# Patient Record
Sex: Male | Born: 1945
Health system: Southern US, Community
[De-identification: ages and names within clinical notes are randomized; demographics above are authoritative.]

## PROBLEM LIST (undated history)

## (undated) ENCOUNTER — Emergency Department (HOSPITAL_BASED_OUTPATIENT_CLINIC_OR_DEPARTMENT_OTHER): Admission: EM | Payer: Self-pay | Source: Home / Self Care

## (undated) DIAGNOSIS — E7800 Pure hypercholesterolemia, unspecified: Secondary | ICD-10-CM

## (undated) DIAGNOSIS — I6529 Occlusion and stenosis of unspecified carotid artery: Secondary | ICD-10-CM

## (undated) DIAGNOSIS — K5790 Diverticulosis of intestine, part unspecified, without perforation or abscess without bleeding: Secondary | ICD-10-CM

## (undated) DIAGNOSIS — C4490 Unspecified malignant neoplasm of skin, unspecified: Secondary | ICD-10-CM

## (undated) DIAGNOSIS — K222 Esophageal obstruction: Secondary | ICD-10-CM

## (undated) DIAGNOSIS — I10 Essential (primary) hypertension: Secondary | ICD-10-CM

## (undated) DIAGNOSIS — M19079 Primary osteoarthritis, unspecified ankle and foot: Secondary | ICD-10-CM

## (undated) DIAGNOSIS — E739 Lactose intolerance, unspecified: Secondary | ICD-10-CM

## (undated) DIAGNOSIS — I639 Cerebral infarction, unspecified: Secondary | ICD-10-CM

## (undated) DIAGNOSIS — K635 Polyp of colon: Secondary | ICD-10-CM

## (undated) DIAGNOSIS — D869 Sarcoidosis, unspecified: Secondary | ICD-10-CM

## (undated) DIAGNOSIS — H269 Unspecified cataract: Secondary | ICD-10-CM

## (undated) DIAGNOSIS — K219 Gastro-esophageal reflux disease without esophagitis: Secondary | ICD-10-CM

## (undated) HISTORY — DX: Diverticulosis of intestine, part unspecified, without perforation or abscess without bleeding: K57.90

## (undated) HISTORY — PX: MOHS SURGERY: SUR867

## (undated) HISTORY — DX: Occlusion and stenosis of unspecified carotid artery: I65.29

## (undated) HISTORY — DX: Gastro-esophageal reflux disease without esophagitis: K21.9

## (undated) HISTORY — DX: Lactose intolerance, unspecified: E73.9

## (undated) HISTORY — PX: COLONOSCOPY: SHX174

## (undated) HISTORY — DX: Unspecified malignant neoplasm of skin, unspecified: C44.90

## (undated) HISTORY — DX: Esophageal obstruction: K22.2

## (undated) HISTORY — DX: Polyp of colon: K63.5

## (undated) HISTORY — DX: Sarcoidosis, unspecified: D86.9

## (undated) HISTORY — PX: TONSILLECTOMY: SUR1361

## (undated) HISTORY — DX: Cerebral infarction, unspecified: I63.9

## (undated) HISTORY — DX: Essential (primary) hypertension: I10

## (undated) HISTORY — DX: Unspecified cataract: H26.9

---

## 2007-08-16 ENCOUNTER — Encounter: Admission: RE | Admit: 2007-08-16 | Discharge: 2007-08-16 | Payer: Self-pay | Admitting: Rheumatology

## 2007-09-20 ENCOUNTER — Ambulatory Visit: Payer: Self-pay | Admitting: Internal Medicine

## 2007-09-20 DIAGNOSIS — R930 Abnormal findings on diagnostic imaging of skull and head, not elsewhere classified: Secondary | ICD-10-CM | POA: Insufficient documentation

## 2007-09-20 DIAGNOSIS — D869 Sarcoidosis, unspecified: Secondary | ICD-10-CM | POA: Insufficient documentation

## 2007-09-20 HISTORY — DX: Sarcoidosis, unspecified: D86.9

## 2007-10-04 ENCOUNTER — Ambulatory Visit: Payer: Self-pay | Admitting: Cardiovascular Disease

## 2008-01-18 ENCOUNTER — Encounter: Payer: Self-pay | Admitting: Internal Medicine

## 2008-02-08 ENCOUNTER — Encounter: Admission: RE | Admit: 2008-02-08 | Discharge: 2008-02-08 | Payer: Self-pay | Admitting: Rheumatology

## 2011-07-10 DIAGNOSIS — I639 Cerebral infarction, unspecified: Secondary | ICD-10-CM

## 2011-07-10 HISTORY — DX: Cerebral infarction, unspecified: I63.9

## 2011-07-30 ENCOUNTER — Inpatient Hospital Stay (HOSPITAL_BASED_OUTPATIENT_CLINIC_OR_DEPARTMENT_OTHER)
Admission: EM | Admit: 2011-07-30 | Discharge: 2011-08-04 | DRG: 039 | Disposition: A | Discharge status: Home / Self Care | Payer: Medicare Other | Attending: Internal Medicine | Admitting: Internal Medicine

## 2011-07-30 ENCOUNTER — Emergency Department (HOSPITAL_BASED_OUTPATIENT_CLINIC_OR_DEPARTMENT_OTHER): Payer: Medicare Other

## 2011-07-30 ENCOUNTER — Observation Stay (HOSPITAL_COMMUNITY): Payer: Medicare Other

## 2011-07-30 ENCOUNTER — Encounter (HOSPITAL_BASED_OUTPATIENT_CLINIC_OR_DEPARTMENT_OTHER): Payer: Self-pay | Admitting: *Deleted

## 2011-07-30 DIAGNOSIS — I639 Cerebral infarction, unspecified: Secondary | ICD-10-CM

## 2011-07-30 DIAGNOSIS — E785 Hyperlipidemia, unspecified: Secondary | ICD-10-CM | POA: Diagnosis present

## 2011-07-30 DIAGNOSIS — I6529 Occlusion and stenosis of unspecified carotid artery: Secondary | ICD-10-CM | POA: Diagnosis present

## 2011-07-30 DIAGNOSIS — I498 Other specified cardiac arrhythmias: Secondary | ICD-10-CM | POA: Diagnosis present

## 2011-07-30 DIAGNOSIS — I63239 Cerebral infarction due to unspecified occlusion or stenosis of unspecified carotid arteries: Principal | ICD-10-CM | POA: Diagnosis present

## 2011-07-30 HISTORY — DX: Pure hypercholesterolemia, unspecified: E78.00

## 2011-07-30 HISTORY — DX: Primary osteoarthritis, unspecified ankle and foot: M19.079

## 2011-07-30 LAB — CK TOTAL AND CKMB (NOT AT ARMC): CK, MB: 2.9 ng/mL (ref 0.3–4.0)

## 2011-07-30 LAB — CBC
MCHC: 35.4 g/dL (ref 30.0–36.0)
Platelets: 203 10*3/uL (ref 150–400)
RDW: 13 % (ref 11.5–15.5)

## 2011-07-30 LAB — RAPID URINE DRUG SCREEN, HOSP PERFORMED
Cocaine: NOT DETECTED
Opiates: NOT DETECTED
Tetrahydrocannabinol: NOT DETECTED

## 2011-07-30 LAB — LIPID PANEL
Cholesterol: 219 mg/dL — ABNORMAL HIGH (ref 0–200)
LDL Cholesterol: 141 mg/dL — ABNORMAL HIGH (ref 0–99)
VLDL: 14 mg/dL (ref 0–40)

## 2011-07-30 LAB — COMPREHENSIVE METABOLIC PANEL
AST: 18 U/L (ref 0–37)
Albumin: 4.3 g/dL (ref 3.5–5.2)
Chloride: 104 mEq/L (ref 96–112)
Creatinine, Ser: 1.1 mg/dL (ref 0.50–1.35)
Potassium: 4 mEq/L (ref 3.5–5.1)
Total Bilirubin: 0.5 mg/dL (ref 0.3–1.2)

## 2011-07-30 LAB — DIFFERENTIAL
Basophils Absolute: 0 10*3/uL (ref 0.0–0.1)
Basophils Relative: 0 % (ref 0–1)
Monocytes Absolute: 0.5 10*3/uL (ref 0.1–1.0)
Neutro Abs: 4 10*3/uL (ref 1.7–7.7)
Neutrophils Relative %: 60 % (ref 43–77)

## 2011-07-30 LAB — PROTIME-INR: INR: 1 (ref 0.00–1.49)

## 2011-07-30 LAB — APTT: aPTT: 38 seconds — ABNORMAL HIGH (ref 24–37)

## 2011-07-30 MED ORDER — ASPIRIN 325 MG PO TABS
325.0000 mg | ORAL_TABLET | Freq: Every day | ORAL | Status: DC
  Administered 2011-07-30 – 2011-08-04 (×5): 325 mg via ORAL
  Filled 2011-07-30 (×6): qty 1

## 2011-07-30 MED ORDER — ASPIRIN 325 MG PO TABS: 325.0000 mg | ORAL_TABLET | Freq: Once | ORAL | Status: DC

## 2011-07-30 NOTE — ED Notes (Signed)
Report called to [facility], they are aware of pt coming

## 2011-07-30 NOTE — ED Provider Notes (Signed)
Patient sent to CDU from [facility] by Dr. Fonnie Jarvis. Accepting physician Dr. Jeraldine Loots. TIA protocol  Patient complaining of intermittent transient left facial, arm weakness with some numbness and tingling. Episodes last between 10-15 mintues. He is now asymptomatic with last episode at 6:30 am this morning. 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  He has no known medical problems and takes no med on regular basis. Does not have PCP  PE. Alert and oriented x 3 NAD Head: normocephalic atraumatic Eyes: EOMI, PEERLA ENT: WNL Neck: supple FROM Chest: RRR, no murmer or rub normal S1 S2 Lungs: normal respirations with no adventurous sounds on auscultation. Normal resp effort Abdomen: soft and non tender Extremities FROM of UE and LE with 5/5 strength and normal reflexes.  Neuro: alert and orient with cranial nerves II-XII grossly intact. Normal coordination  VSS  Patient voices his understanding of TIA protocol and will remain in CDU over night for additional imaging in am  At time of dispo, if dispo home then patient needs referral to primary care and consideration of starting medication for blood pressure and cholesterol.   [place], [place] 07/30/11 2048

## 2011-07-30 NOTE — ED Notes (Signed)
Numbness in the left side of his face and left arm x 2 days. Drove himself here. Neuro intact. Steady gait.

## 2011-07-30 NOTE — ED Provider Notes (Signed)
History     CSN: [id number]  Arrival date & time 07/30/11  1428   First MD Initiated Contact with Patient 07/30/11 1514      Chief Complaint  Patient presents with  . Numbness  transient left sided weak/numb  PCP- none  (Consider location/radiation/quality/duration/timing/severity/associated sxs/prior treatment) HPI This 66 year old male over the last 3 days now has had a few spells of transient left facial arm mild weakness numbness tingling each spell lasting less than 10-15 minutes. He is now asymptomatic. His last spell was this morning at 6:30 this morning and has been asymptomatic for the last several hours.  He had no headache, lightheadedness, vertigo, or change in speech vision swallowing or understanding. He also had no palpitations chest pain shortness breath abdominal pain or bloody stools or trauma. He is asymptomatic now and drove himself to the emergency department. There's been no treatment prior to arrival. He had no associated symptoms other than his transient intermittent weakness numbness tingling to his left face and left arm. He had no neck pain or back pain. History reviewed. No pertinent past medical history. Borderline high cholesterol History reviewed. No pertinent past surgical history.  No family history on file.  History  Substance Use Topics  . Smoking status: Never Smoker   . Smokeless tobacco: Not on file  . Alcohol Use: Yes  Tob-none Drugs- none EtOH- occasional    Review of Systems  Constitutional: Negative for fever.       10 Systems reviewed and are negative for acute change except as noted in the HPI.  HENT: Negative for congestion.   Eyes: Negative for discharge and redness.  Respiratory: Negative for cough and shortness of breath.   Cardiovascular: Negative for chest pain.  Gastrointestinal: Negative for vomiting and abdominal pain.  Musculoskeletal: Negative for back pain.  Skin: Negative for rash.  Neurological: Positive for  weakness and numbness. Negative for dizziness, syncope, speech difficulty, light-headedness and headaches.  Psychiatric/Behavioral:       No behavior change.    Allergies  Review of patient's allergies indicates no known allergies.  Home Medications  No current outpatient prescriptions on file.  BP 150/69  Pulse 65  Temp 98.3 F (36.8 C) (Oral)  Resp 18  SpO2 98%  Physical Exam  Nursing note and vitals reviewed. Constitutional:       Awake, alert, nontoxic appearance with baseline speech for patient.  HENT:  Head: Atraumatic.  Mouth/Throat: No oropharyngeal exudate.  Eyes: EOM are normal. Pupils are equal, round, and reactive to light. Right eye exhibits no discharge. Left eye exhibits no discharge.  Neck: Neck supple.  Cardiovascular: Normal rate and regular rhythm.   No murmur heard. Pulmonary/Chest: Effort normal and breath sounds normal. No stridor. No respiratory distress. He has no wheezes. He has no rales. He exhibits no tenderness.  Abdominal: Soft. Bowel sounds are normal. He exhibits no mass. There is no tenderness. There is no rebound.  Musculoskeletal: He exhibits no tenderness.       Baseline ROM, moves extremities with no obvious new focal weakness.  Lymphadenopathy:    He has no cervical adenopathy.  Neurological:       Awake, alert, cooperative and aware of situation; motor strength bilaterally; sensation normal to light touch bilaterally; peripheral visual fields full to confrontation; no facial asymmetry; tongue midline; major cranial nerves appear intact; no pronator drift, normal finger to nose bilaterally, baseline gait without new ataxia.  Skin: No rash noted.  Psychiatric: He has a normal  mood and affect.    ED Course  Procedures (including critical care time) ECG: Sinus rhythm with occasional premature ventricular complexes, ventricular rate 64, normal axis, normal intervals, no acute ischemic changes noted, no comparison ECG available  Pt stable  in ED with no significant deterioration in condition.Patient / Family / Caregiver informed of clinical course, understand medical decision-making process, and agree with plan for ED Obs.  Pt asymptomatic in ED and agrees to ED Obs at [facility] CDU for TIA protocol. 1830 Labs Reviewed  APTT - Abnormal; Notable for the following:    aPTT 38 (*)     All other components within normal limits  COMPREHENSIVE METABOLIC PANEL - Abnormal; Notable for the following:    GFR calc non Af Amer 68 (*)     GFR calc Af Amer 78 (*)     All other components within normal limits  PROTIME-INR  CBC  DIFFERENTIAL  CK TOTAL AND CKMB  TROPONIN I  URINE RAPID DRUG SCREEN (HOSP PERFORMED)  GLUCOSE, CAPILLARY  LIPID PANEL  HEMOGLOBIN A1C   Ct Head Wo Contrast  07/30/2011  *RADIOLOGY REPORT*  Clinical Data: 66 year old male with left arm weakness.  CT HEAD WITHOUT CONTRAST  Technique:  Contiguous axial images were obtained from the base of the skull through the vertex without contrast.  Comparison: None  Findings: No acute intracranial abnormalities are identified, including mass lesion or mass effect, hydrocephalus, extra-axial fluid collection, midline shift, hemorrhage, or acute infarction.  The visualized bony calvarium is unremarkable.  IMPRESSION: No evidence of acute intracranial abnormality.  Original Report Authenticated By: Rosendo Gros, M.D.     1. TIA (transient ischemic attack)       MDM  Care endorsed to [facility] ED CDU d/w [facility] and Adalberto Cole, MD for transfer to complete ED TIA Obs protocol.[id number]        Hurman Horn, MD 08/04/11 [phone]

## 2011-07-30 NOTE — ED Notes (Signed)
Numbness on the left side of his face and left arm x 2 days. No facial droop. No change in thought process. Gait normal. Pt drove himself here.

## 2011-07-30 NOTE — ED Notes (Signed)
Patient transported to MRI 

## 2011-07-31 ENCOUNTER — Inpatient Hospital Stay (HOSPITAL_COMMUNITY): Payer: Medicare Other

## 2011-07-31 ENCOUNTER — Encounter (HOSPITAL_COMMUNITY): Payer: Self-pay | Admitting: Internal Medicine

## 2011-07-31 DIAGNOSIS — I63239 Cerebral infarction due to unspecified occlusion or stenosis of unspecified carotid arteries: Secondary | ICD-10-CM

## 2011-07-31 DIAGNOSIS — G8190 Hemiplegia, unspecified affecting unspecified side: Secondary | ICD-10-CM

## 2011-07-31 DIAGNOSIS — I6529 Occlusion and stenosis of unspecified carotid artery: Secondary | ICD-10-CM | POA: Diagnosis present

## 2011-07-31 DIAGNOSIS — I639 Cerebral infarction, unspecified: Secondary | ICD-10-CM | POA: Diagnosis present

## 2011-07-31 LAB — HEMOGLOBIN A1C: Mean Plasma Glucose: 114 mg/dL (ref <117)

## 2011-07-31 LAB — MAGNESIUM: Magnesium: 2.1 mg/dL (ref 1.5–2.5)

## 2011-07-31 MED ORDER — ASPIRIN 325 MG PO TABS
325.0000 mg | ORAL_TABLET | Freq: Once | ORAL | Status: AC
  Administered 2011-07-31: 325 mg via ORAL
  Filled 2011-07-31: qty 1

## 2011-07-31 MED ORDER — ONDANSETRON HCL 4 MG/2ML IJ SOLN: 4.0000 mg | Freq: Three times a day (TID) | INTRAMUSCULAR | Status: AC | PRN

## 2011-07-31 MED ORDER — SODIUM CHLORIDE 0.9 % IV BOLUS (SEPSIS)
250.0000 mL | Freq: Once | INTRAVENOUS | Status: AC
  Administered 2011-07-31: 18:00:00 via INTRAVENOUS

## 2011-07-31 MED ORDER — ENOXAPARIN SODIUM 40 MG/0.4ML ~~LOC~~ SOLN
40.0000 mg | SUBCUTANEOUS | Status: DC
  Administered 2011-07-31 – 2011-08-02 (×3): 40 mg via SUBCUTANEOUS
  Filled 2011-07-31 (×4): qty 0.4

## 2011-07-31 MED ORDER — ATORVASTATIN CALCIUM 40 MG PO TABS
40.0000 mg | ORAL_TABLET | Freq: Every day | ORAL | Status: DC
  Administered 2011-07-31 – 2011-08-02 (×3): 40 mg via ORAL
  Filled 2011-07-31 (×6): qty 1

## 2011-07-31 NOTE — ED Notes (Signed)
Doppler at bedside.

## 2011-07-31 NOTE — Progress Notes (Signed)
C/o numbness left face. Senses wet in all facial locations except left of left eye at temple. Dr Thad Ranger informed

## 2011-07-31 NOTE — ED Notes (Signed)
Paged [facility] to receive ETA for doppler/echo.

## 2011-07-31 NOTE — ED Provider Notes (Signed)
History     CSN: [id number]  Arrival date & time 07/30/11  1428   First MD Initiated Contact with Patient 07/30/11 1514      Chief Complaint  Patient presents with  . Numbness    (Consider location/radiation/quality/duration/timing/severity/associated sxs/prior treatment) HPI  History reviewed. No pertinent past medical history.  History reviewed. No pertinent past surgical history.  No family history on file.  History  Substance Use Topics  . Smoking status: Never Smoker   . Smokeless tobacco: Not on file  . Alcohol Use: Yes      Review of Systems  Allergies  Review of patient's allergies indicates no known allergies.  Home Medications  No current outpatient prescriptions on file.  BP 152/61  Pulse 68  Temp 97.9 F (36.6 C) (Oral)  Resp 20  SpO2 99%  Physical Exam  ED Course  Procedures (including critical care time)  Labs Reviewed  APTT - Abnormal; Notable for the following:    aPTT 38 (*)     All other components within normal limits  COMPREHENSIVE METABOLIC PANEL - Abnormal; Notable for the following:    GFR calc non Af Amer 68 (*)     GFR calc Af Amer 78 (*)     All other components within normal limits  LIPID PANEL - Abnormal; Notable for the following:    Cholesterol 219 (*)     LDL Cholesterol 141 (*)     All other components within normal limits  PROTIME-INR  CBC  DIFFERENTIAL  CK TOTAL AND CKMB  TROPONIN I  URINE RAPID DRUG SCREEN (HOSP PERFORMED)  GLUCOSE, CAPILLARY  HEMOGLOBIN A1C   Ct Head Wo Contrast  07/30/2011  *RADIOLOGY REPORT*  Clinical Data: 66 year old male with left arm weakness.  CT HEAD WITHOUT CONTRAST  Technique:  Contiguous axial images were obtained from the base of the skull through the vertex without contrast.  Comparison: None  Findings: No acute intracranial abnormalities are identified, including mass lesion or mass effect, hydrocephalus, extra-axial fluid collection, midline shift, hemorrhage, or acute  infarction.  The visualized bony calvarium is unremarkable.  IMPRESSION: No evidence of acute intracranial abnormality.  Original Report Authenticated By: Rosendo Gros, M.D.   Mr Brain Wo Contrast  07/30/2011    *RADIOLOGY REPORT*  Clinical Data:  Left arm numbness.  Rule out CVA.  MRI HEAD WITHOUT CONTRAST MRA HEAD WITHOUT CONTRAST  Technique:  Multiplanar, multiecho pulse sequences of the brain and surrounding structures were obtained without intravenous contrast. Angiographic images of the head were obtained using MRA technique without contrast.  Comparison:  CT 07/30/2011  MRI HEAD  Findings:  4 mm focus of restricted diffusion in the right parietal cortex compatible with acute infarct.  No other acute infarct.  Scattered tiny white matter hyperintensities bilaterally compatible with chronic microvascular ischemia.  No cortical infarct. Brainstem and cerebellum are normal.  Negative for hemorrhage or mass lesion.  Chronic sinusitis.  Mucous retention cyst left maxillary sinus.  IMPRESSION: 4 mm acute infarct right parietal cortex.  Mild chronic microvascular ischemia.  MRA HEAD  Findings: Both vertebral arteries are patent to the basilar.  PICA is patent bilaterally.  Superior cerebellar and posterior cerebral arteries are patent.  Fetal origin of the right posterior cerebral artery.  Internal carotid artery is widely patent bilaterally without stenosis.  Anterior and middle cerebral arteries are patent bilaterally.  Hypoplastic right A1 segment.  Negative for cerebral aneurysm.  IMPRESSION: Negative  Original Report Authenticated By: [name], M.D.  Mr Maxine Glenn Head/brain Wo Cm  07/30/2011    *RADIOLOGY REPORT*  Clinical Data:  Left arm numbness.  Rule out CVA.  MRI HEAD WITHOUT CONTRAST MRA HEAD WITHOUT CONTRAST  Technique:  Multiplanar, multiecho pulse sequences of the brain and surrounding structures were obtained without intravenous contrast. Angiographic images of the head were obtained using MRA  technique without contrast.  Comparison:  CT 07/30/2011  MRI HEAD  Findings:  4 mm focus of restricted diffusion in the right parietal cortex compatible with acute infarct.  No other acute infarct.  Scattered tiny white matter hyperintensities bilaterally compatible with chronic microvascular ischemia.  No cortical infarct. Brainstem and cerebellum are normal.  Negative for hemorrhage or mass lesion.  Chronic sinusitis.  Mucous retention cyst left maxillary sinus.  IMPRESSION: 4 mm acute infarct right parietal cortex.  Mild chronic microvascular ischemia.  MRA HEAD  Findings: Both vertebral arteries are patent to the basilar.  PICA is patent bilaterally.  Superior cerebellar and posterior cerebral arteries are patent.  Fetal origin of the right posterior cerebral artery.  Internal carotid artery is widely patent bilaterally without stenosis.  Anterior and middle cerebral arteries are patent bilaterally.  Hypoplastic right A1 segment.  Negative for cerebral aneurysm.  IMPRESSION: Negative  Original Report Authenticated By: [name], M.D.     1. TIA (transient ischemic attack)    11:30 AM  Patient seen and examined. MRI shows 4mm R parietal lobe infarct. R carotid 80% stenosis. Discussed results with Dr. Radford Pax. Will call neuro.   Exam:  Gen NAD; Neck carotid bruit on right; Heart RRR, nml S1,S2, no m/r/g; Lungs CTAB; Abd soft, NT, no rebound or guarding; Ext 2+ pedal pulses bilaterally, no edema; Neuro CN II-XII grossly intact.   11:38 AM I have spoken with Dr. Thad Ranger who will consult. Will admit to medicine service for eval, possible carotid endarterectomy.   12:16 PM OPC to admit patient, attending Dr. Meredith Pel.    MDM  Acute stroke, admit.         Renne Crigler, [place] 07/31/11 1219  Renne Crigler, [place] 07/31/11 1939

## 2011-07-31 NOTE — ED Provider Notes (Signed)
I was available throughout the CDU portion of this patient's care.  Gerhard Munch, MD 07/31/11 Jacinta Shoe

## 2011-07-31 NOTE — ED Notes (Signed)
Family at bedside. 

## 2011-07-31 NOTE — ED Notes (Signed)
Patient is resting comfortably. 

## 2011-07-31 NOTE — ED Notes (Signed)
Initial assessment by this RN performed at 0715.  Pt c/a/ox4, nad, no complaints of pain.  Pt ambulatory to RR.  Pt advised of tx plan stated he understood.

## 2011-07-31 NOTE — Progress Notes (Addendum)
Bilateral carotid duplex completed.  Preliminary report is >80% ICA stenosis on the right and 40-59% ICA stenosis on the left.

## 2011-07-31 NOTE — Consult Note (Signed)
Referring Physician: Yetta Barre    Chief Complaint: Left sided weakness and numbness  HPI: Eduardo Shaw is an 66 y.o. male who reports that on Tuesday of this week he had an episode of numbness and tingling along the left side of the face and the left arm.  Symptoms resolved.  Symptoms returned on Wednesday and Thursday and became associated with the inability to perform fine motor movements, such as buttoning his shirt with his left hand as well.  Even the repeated symptoms lasted no  More than 20 minutes at a time.  They all resolved spontaneously.  Patient presented for evaluation at that time.  Was sent to [facility] for TIA work up.  In work up MRI of the brain was performed and shows a 4mm acute right parietal cortex infarct.  MRA of the brain was unremarkable.  Preliminary carotid doppler report shows greater than 80% stenosis of the RICA.  LICA is patent.    LSN: 07/27/2011 tPA Given: No: Resolution of symptoms  Past medical history: non-contributory  Past surgical history: None  No family history on file.  Social History:  reports that he has never smoked. He does not have any smokeless tobacco history on file. He reports that he drinks alcohol. He reports that he does not use illicit drugs.  Allergies: No Known Allergies  Medications: I have reviewed the patient's current medications. Prior to Admission:  None  ROS: History obtained from the patient  General ROS: negative for - chills, fatigue, fever, night sweats, weight gain or weight loss Psychological ROS: negative for - behavioral disorder, hallucinations, memory difficulties, mood swings or suicidal ideation Ophthalmic ROS: negative for - blurry vision, double vision, eye pain or loss of vision ENT ROS: negative for - epistaxis, nasal discharge, oral lesions, sore throat, tinnitus or vertigo Allergy and Immunology ROS: negative for - hives or itchy/watery eyes Hematological and Lymphatic ROS: negative for - bleeding problems,  bruising or swollen lymph nodes Endocrine ROS: negative for - galactorrhea, hair pattern changes, polydipsia/polyuria or temperature intolerance Respiratory ROS: negative for - cough, hemoptysis, shortness of breath or wheezing Cardiovascular ROS: negative for - chest pain, dyspnea on exertion, edema or irregular heartbeat Gastrointestinal ROS: negative for - abdominal pain, diarrhea, hematemesis, nausea/vomiting or stool incontinence Genito-Urinary ROS: negative for - dysuria, hematuria, incontinence or urinary frequency/urgency Musculoskeletal ROS: negative for - joint swelling or muscular weakness Neurological ROS: as noted in HPI Dermatological ROS: negative for rash and skin lesion changes  Physical Examination: Blood pressure 152/61, pulse 68, temperature 97.9 F (36.6 C), temperature source Oral, resp. rate 20, SpO2 99.00%.  Neurologic Examination: Mental Status: Alert, oriented, thought content appropriate.  Speech fluent without evidence of aphasia.  Able to follow 3 step commands without difficulty. Cranial Nerves: II: visual fields grossly normal, pupils equal, round, reactive to light and accommodation III,IV, VI: ptosis not present, extra-ocular motions intact bilaterally V,VII: smile symmetric, facial light touch sensation normal bilaterally VIII: hearing normal bilaterally IX,X: gag reflex present XI: trapezius strength/neck flexion strength normal bilaterally XII: tongue strength normal  Motor: Right : Upper extremity   5/5    Left:     Upper extremity   5/5  Lower extremity   5/5     Lower extremity   5/5 Tone and bulk:normal tone throughout; no atrophy noted Sensory: Pinprick and light touch intact throughout, bilaterally Deep Tendon Reflexes: 2+ and symmetric with trace AJ's bilaterally  Plantars: Right: downgoing   Left: downgoing Cerebellar: normal finger-to-nose and normal  heel-to-shin test  Results for orders placed during the hospital encounter of 07/30/11  (from the past 48 hour(s))  PROTIME-INR     Status: Normal   Collection Time   07/30/11  3:31 PM      Component Value Range Comment   Prothrombin Time 13.4  11.6 - 15.2 seconds    INR 1.00  0.00 - 1.49   APTT     Status: Abnormal   Collection Time   07/30/11  3:31 PM      Component Value Range Comment   aPTT 38 (*) 24 - 37 seconds   CBC     Status: Normal   Collection Time   07/30/11  3:31 PM      Component Value Range Comment   WBC 6.8  4.0 - 10.5 K/uL    RBC 5.05  4.22 - 5.81 MIL/uL    Hemoglobin 15.7  13.0 - 17.0 g/dL    HCT 40.9  81.1 - 91.4 %    MCV 87.9  78.0 - 100.0 fL    MCH 31.1  26.0 - 34.0 pg    MCHC 35.4  30.0 - 36.0 g/dL    RDW 78.2  95.6 - 21.3 %    Platelets 203  150 - 400 K/uL   DIFFERENTIAL     Status: Normal   Collection Time   07/30/11  3:31 PM      Component Value Range Comment   Neutrophils Relative 60  43 - 77 %    Neutro Abs 4.0  1.7 - 7.7 K/uL    Lymphocytes Relative 30  12 - 46 %    Lymphs Abs 2.1  0.7 - 4.0 K/uL    Monocytes Relative 8  3 - 12 %    Monocytes Absolute 0.5  0.1 - 1.0 K/uL    Eosinophils Relative 2  0 - 5 %    Eosinophils Absolute 0.1  0.0 - 0.7 K/uL    Basophils Relative 0  0 - 1 %    Basophils Absolute 0.0  0.0 - 0.1 K/uL   COMPREHENSIVE METABOLIC PANEL     Status: Abnormal   Collection Time   07/30/11  3:31 PM      Component Value Range Comment   Sodium 140  135 - 145 mEq/L    Potassium 4.0  3.5 - 5.1 mEq/L    Chloride 104  96 - 112 mEq/L    CO2 25  19 - 32 mEq/L    Glucose, Bld 87  70 - 99 mg/dL    BUN 22  6 - 23 mg/dL    Creatinine, Ser 0.86  0.50 - 1.35 mg/dL    Calcium 9.6  8.4 - 57.8 mg/dL    Total Protein 7.5  6.0 - 8.3 g/dL    Albumin 4.3  3.5 - 5.2 g/dL    AST 18  0 - 37 U/L    ALT 11  0 - 53 U/L    Alkaline Phosphatase 57  39 - 117 U/L    Total Bilirubin 0.5  0.3 - 1.2 mg/dL    GFR calc non Af Amer 68 (*) >90 mL/min    GFR calc Af Amer 78 (*) >90 mL/min   CK TOTAL AND CKMB     Status: Normal   Collection Time     07/30/11  3:31 PM      Component Value Range Comment   Total CK 129  7 - 232 U/L    CK,  MB 2.9  0.3 - 4.0 ng/mL    Relative Index 2.2  0.0 - 2.5   TROPONIN I     Status: Normal   Collection Time   07/30/11  3:31 PM      Component Value Range Comment   Troponin I <0.30  <0.30 ng/mL   LIPID PANEL     Status: Abnormal   Collection Time   07/30/11  3:31 PM      Component Value Range Comment   Cholesterol 219 (*) 0 - 200 mg/dL    Triglycerides 71  <161 mg/dL    HDL 64  >09 mg/dL    Total CHOL/HDL Ratio 3.4      VLDL 14  0 - 40 mg/dL    LDL Cholesterol 604 (*) 0 - 99 mg/dL   HEMOGLOBIN V4U     Status: Normal   Collection Time   07/30/11  3:31 PM      Component Value Range Comment   Hemoglobin A1C 5.6  <5.7 %    Mean Plasma Glucose 114  <117 mg/dL   GLUCOSE, CAPILLARY     Status: Normal   Collection Time   07/30/11  4:04 PM      Component Value Range Comment   Glucose-Capillary 73  70 - 99 mg/dL   URINE RAPID DRUG SCREEN (HOSP PERFORMED)     Status: Normal   Collection Time   07/30/11  5:51 PM      Component Value Range Comment   Opiates NONE DETECTED  NONE DETECTED    Cocaine NONE DETECTED  NONE DETECTED    Benzodiazepines NONE DETECTED  NONE DETECTED    Amphetamines NONE DETECTED  NONE DETECTED    Tetrahydrocannabinol NONE DETECTED  NONE DETECTED    Barbiturates NONE DETECTED  NONE DETECTED    Ct Head Wo Contrast  07/30/2011  *RADIOLOGY REPORT*  Clinical Data: 66 year old male with left arm weakness.  CT HEAD WITHOUT CONTRAST  Technique:  Contiguous axial images were obtained from the base of the skull through the vertex without contrast.  Comparison: None  Findings: No acute intracranial abnormalities are identified, including mass lesion or mass effect, hydrocephalus, extra-axial fluid collection, midline shift, hemorrhage, or acute infarction.  The visualized bony calvarium is unremarkable.  IMPRESSION: No evidence of acute intracranial abnormality.  Original Report  Authenticated By: Rosendo Gros, M.D.   Mr Brain Wo Contrast  07/30/2011    *RADIOLOGY REPORT*  Clinical Data:  Left arm numbness.  Rule out CVA.  MRI HEAD WITHOUT CONTRAST MRA HEAD WITHOUT CONTRAST  Technique:  Multiplanar, multiecho pulse sequences of the brain and surrounding structures were obtained without intravenous contrast. Angiographic images of the head were obtained using MRA technique without contrast.  Comparison:  CT 07/30/2011  MRI HEAD  Findings:  4 mm focus of restricted diffusion in the right parietal cortex compatible with acute infarct.  No other acute infarct.  Scattered tiny white matter hyperintensities bilaterally compatible with chronic microvascular ischemia.  No cortical infarct. Brainstem and cerebellum are normal.  Negative for hemorrhage or mass lesion.  Chronic sinusitis.  Mucous retention cyst left maxillary sinus.  IMPRESSION: 4 mm acute infarct right parietal cortex.  Mild chronic microvascular ischemia.  MRA HEAD  Findings: Both vertebral arteries are patent to the basilar.  PICA is patent bilaterally.  Superior cerebellar and posterior cerebral arteries are patent.  Fetal origin of the right posterior cerebral artery.  Internal carotid artery is widely patent bilaterally without stenosis.  Anterior  and middle cerebral arteries are patent bilaterally.  Hypoplastic right A1 segment.  Negative for cerebral aneurysm.  IMPRESSION: Negative  Original Report Authenticated By: [name], M.D.   Mr Mra Head/brain Wo Cm  07/30/2011    *RADIOLOGY REPORT*  Clinical Data:  Left arm numbness.  Rule out CVA.  MRI HEAD WITHOUT CONTRAST MRA HEAD WITHOUT CONTRAST  Technique:  Multiplanar, multiecho pulse sequences of the brain and surrounding structures were obtained without intravenous contrast. Angiographic images of the head were obtained using MRA technique without contrast.  Comparison:  CT 07/30/2011  MRI HEAD  Findings:  4 mm focus of restricted diffusion in the right parietal  cortex compatible with acute infarct.  No other acute infarct.  Scattered tiny white matter hyperintensities bilaterally compatible with chronic microvascular ischemia.  No cortical infarct. Brainstem and cerebellum are normal.  Negative for hemorrhage or mass lesion.  Chronic sinusitis.  Mucous retention cyst left maxillary sinus.  IMPRESSION: 4 mm acute infarct right parietal cortex.  Mild chronic microvascular ischemia.  MRA HEAD  Findings: Both vertebral arteries are patent to the basilar.  PICA is patent bilaterally.  Superior cerebellar and posterior cerebral arteries are patent.  Fetal origin of the right posterior cerebral artery.  Internal carotid artery is widely patent bilaterally without stenosis.  Anterior and middle cerebral arteries are patent bilaterally.  Hypoplastic right A1 segment.  Negative for cerebral aneurysm.  IMPRESSION: Negative  Original Report Authenticated By: [name], M.D.    Assessment: 66 y.o. male without known stroke risk factors who presents with a small right parietal infarct.  Exam now nonfocal.  Work has revealed a greater than 80% ICA stenosis.  Based on acute infarct this stenosis would be considered symptomatic.  If this stenosis is substantiated patient would benefit from evaluation for a right CEA.  Stroke Risk Factors - carotid stenosis  Plan: 1. HgbA1c, fasting lipid panel 2. Echocardiogram pending 3. CT angio of the neck 6. Prophylactic therapy-Antiplatelet med: Aspirin - dose 325mg  daily 7. Risk factor modification 8. Telemetry monitoring 9. Frequent neuro checks 10. Vascular surgery consult for possible CEA    Thana Farr, MD [facility] [phone] 07/31/2011, 12:49 PM

## 2011-07-31 NOTE — Progress Notes (Signed)
Received to room 3019

## 2011-07-31 NOTE — H&P (Signed)
Hospital Admission Note Date: 07/31/2011  Patient name: Eduardo Shaw Medical record number: [id number] Date of birth: 11-06-1945 Age: 66 y.o. Gender: male PCP: No PCP  Medical Service: Internal Medicine Teaching Service   Attending physician: Dr Meredith Pel   1st Contact: Dr Manson Passey    Pager: [phone] 2nd Contact: Dr Loistine Chance     Pager: [phone]  After 5 pm or weekends: 1st Contact:      Pager: [phone] 2nd Contact:      Pager: [phone]  Chief Complaint:   History of Present Illness: This is a 66 year old male with PMH significant for Hyperlipidemia  who presented to the ED [place] with tingling on his left face and left arm.  Three days prior to admission, the patient experienced left-sided face, arm, and hand tingling, constant and unchanged since that time.  He notes no other tingling in his body, no weakness, and no pain.  No blurry vision, hearing changes, facial droop, slurred speech.  He notes no history of prior stroke.  MRI was performed, which showed an acute right parietal cortex infarct.  Carotid dopplers revealed a >80% R carotid artery stenosis.  The patient notes no chest pain, SOB, nausea/vomiting, abd pain, diarrhea/constipation, melena, or dysuria.  The patient also notes a several month history of calf cramping, described as a "cramping" sensation in the back of both legs, typically occurring when lying down at night, with no alleviating factors.  He notes no leg cramping when walking up stairs or performing other physical activity.  No history of CAD or PVD.  Meds: none  Allergies: Allergies as of 07/30/2011  . (No Known Allergies)   Past Medical History  Diagnosis Date  . Hypercholesterolemia   . Arthritis of foot   One episode of syncope 25 years ago, no etiology identified  Past surgical history:  Moh's surgery to face  Family History  Problem Relation Age of Onset  . Lupus Mother   . Lupus Sister   No family history of CVA, MI, HTN, DM, HL  History    Social History  . Marital Status: Married    Spouse Name: N/A    Number of Children: N/A  . Years of Education: N/A   Occupational History  . Not on file.   Social History Main Topics  . Smoking status: Never Smoker   . Smokeless tobacco: Not on file  . Alcohol Use: Yes     1-2 drinks on weekends  . Drug Use: No  . Sexually Active: Not on file   Other Topics Concern  . 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  Finished high school.  Can read and write.  Hasn't seen a doctor in many years.    Review of Systems: Positive if bold.  Constitutional: Denies fever, chills, diaphoresis, appetite change and fatigue.  HEENT: Denies photophobia, eye pain, redness, hearing loss, ear pain, congestion, sore throat, rhinorrhea, sneezing, mouth sores, trouble swallowing, neck pain, neck stiffness and tinnitus.   Respiratory: Denies SOB, DOE, cough, chest tightness,  and wheezing.   Cardiovascular: Denies chest pain, palpitations and leg swelling.  Gastrointestinal: Denies nausea, vomiting, abdominal pain, diarrhea, constipation, blood in stool and abdominal distention.  Genitourinary: Denies dysuria, urgency, frequency, hematuria, flank pain and difficulty urinating.  Musculoskeletal: Denies myalgias, back pain, joint swelling, arthralgias and gait problem.  Skin: Denies pallor, rash and wound.  Neurological: Denies dizziness,  seizures, syncope, weakness, light-headedness, numbness and headaches.  Hematological: Denies adenopathy. Easy bruising, personal or family bleeding history  Psychiatric/Behavioral: Denies suicidal ideation, mood changes, confusion, nervousness, sleep disturbance and agitation  Physical Exam: Blood pressure 152/61, pulse 68, temperature 97.9 F (36.6 C), temperature source Oral, resp. rate 20, SpO2 99.00%. Physical Exam: General: alert, cooperative, and in no apparent distress HEENT:  pupils equal round and reactive to light, vision grossly intact, oropharynx clear and non-erythematous  Neck: supple, no lymphadenopathy, right carotid bruit appreciated Lungs: clear to ascultation bilaterally, normal work of respiration, no wheezes, rales, ronchi Heart: regular rate and rhythm, no murmurs, gallops, or rubs Abdomen: soft, non-tender, non-distended, normal bowel sounds Extremities: no cyanosis, clubbing, or edema Neurologic: alert & oriented X3, cranial nerves II-XII intact, strength 5/5 throughout, sensation intact to light touch throughout, reflexes 2+ throughout  Lab results: Basic Metabolic Panel:  Basename 07/30/11 1531  NA 140  K 4.0  CL 104  CO2 25  GLUCOSE 87  BUN 22  CREATININE 1.10  CALCIUM 9.6  MG --  PHOS --   Liver Function Tests:  Basename 07/30/11 1531  AST 18  ALT 11  ALKPHOS 57  BILITOT 0.5  PROT 7.5  ALBUMIN 4.3    CBC:  Basename 07/30/11 1531  WBC 6.8  NEUTROABS 4.0  HGB 15.7  HCT 44.4  MCV 87.9  PLT 203   Cardiac Enzymes:  Basename 07/30/11 1531  CKTOTAL 129  CKMB 2.9  CKMBINDEX --  TROPONINI <0.30   CBG:  Basename 07/30/11 1604  GLUCAP 73   Hemoglobin A1C:  Basename 07/30/11 1531  HGBA1C 5.6   Fasting Lipid Panel:  Basename 07/30/11 1531  CHOL 219*  HDL 64  LDLCALC 141*  TRIG 71  CHOLHDL 3.4  LDLDIRECT --   Coagulation:  Basename 07/30/11 1531  LABPROT 13.4  INR 1.00   Urine Drug Screen: Drugs of Abuse     Component Value Date/Time   LABOPIA NONE DETECTED 07/30/2011 1751   COCAINSCRNUR NONE DETECTED 07/30/2011 1751   LABBENZ NONE DETECTED 07/30/2011 1751   AMPHETMU NONE DETECTED 07/30/2011 1751   THCU NONE DETECTED 07/30/2011 1751   LABBARB NONE DETECTED 07/30/2011 1751     Imaging results:  Ct Head Wo Contrast  07/30/2011  *RADIOLOGY REPORT*  Clinical Data: 66 year old male with left arm weakness.  CT HEAD WITHOUT CONTRAST  Technique:  Contiguous axial images were obtained from the base of the  skull through the vertex without contrast.  Comparison: None  Findings: No acute intracranial abnormalities are identified, including mass lesion or mass effect, hydrocephalus, extra-axial fluid collection, midline shift, hemorrhage, or acute infarction.  The visualized bony calvarium is unremarkable.  IMPRESSION: No evidence of acute intracranial abnormality.  Original Report Authenticated By: Rosendo Gros, M.D.   Mr Brain Wo Contrast  07/30/2011    *RADIOLOGY REPORT*  Clinical Data:  Left arm numbness.  Rule out CVA.  MRI HEAD WITHOUT CONTRAST MRA HEAD WITHOUT CONTRAST  Technique:  Multiplanar, multiecho pulse sequences of the brain and surrounding structures were obtained without intravenous contrast. Angiographic images of the head were obtained using MRA technique without contrast.  Comparison:  CT 07/30/2011  MRI HEAD  Findings:  4 mm focus of restricted diffusion in the right parietal cortex compatible with acute infarct.  No other acute infarct.  Scattered tiny white matter hyperintensities bilaterally compatible with chronic microvascular ischemia.  No cortical infarct. Brainstem and cerebellum are normal.  Negative for hemorrhage or mass lesion.  Chronic  sinusitis.  Mucous retention cyst left maxillary sinus.  IMPRESSION: 4 mm acute infarct right parietal cortex.  Mild chronic microvascular ischemia.  MRA HEAD  Findings: Both vertebral arteries are patent to the basilar.  PICA is patent bilaterally.  Superior cerebellar and posterior cerebral arteries are patent.  Fetal origin of the right posterior cerebral artery.  Internal carotid artery is widely patent bilaterally without stenosis.  Anterior and middle cerebral arteries are patent bilaterally.  Hypoplastic right A1 segment.  Negative for cerebral aneurysm.  IMPRESSION: Negative  Original Report Authenticated By: [name], M.D.   Mr Mra Head/brain Wo Cm  07/30/2011    *RADIOLOGY REPORT*  Clinical Data:  Left arm numbness.  Rule out CVA.   MRI HEAD WITHOUT CONTRAST MRA HEAD WITHOUT CONTRAST  Technique:  Multiplanar, multiecho pulse sequences of the brain and surrounding structures were obtained without intravenous contrast. Angiographic images of the head were obtained using MRA technique without contrast.  Comparison:  CT 07/30/2011  MRI HEAD  Findings:  4 mm focus of restricted diffusion in the right parietal cortex compatible with acute infarct.  No other acute infarct.  Scattered tiny white matter hyperintensities bilaterally compatible with chronic microvascular ischemia.  No cortical infarct. Brainstem and cerebellum are normal.  Negative for hemorrhage or mass lesion.  Chronic sinusitis.  Mucous retention cyst left maxillary sinus.  IMPRESSION: 4 mm acute infarct right parietal cortex.  Mild chronic microvascular ischemia.  MRA HEAD  Findings: Both vertebral arteries are patent to the basilar.  PICA is patent bilaterally.  Superior cerebellar and posterior cerebral arteries are patent.  Fetal origin of the right posterior cerebral artery.  Internal carotid artery is widely patent bilaterally without stenosis.  Anterior and middle cerebral arteries are patent bilaterally.  Hypoplastic right A1 segment.  Negative for cerebral aneurysm.  IMPRESSION: Negative  Original Report Authenticated By: [name], M.D.   Carotid Duplex 07/31/11: Preliminary Report: Bilateral carotid duplex completed. Preliminary report is >80% ICA stenosis on the right and no evidence of significant ICA stenosis on the left.   Other results: EKG: NSR, occasional PVC's, mild J-point elevation in V3, no other ST abnormalities  Assessment & Plan by Problem: The patient is a 65 yo man, presenting with an acute stroke, with R-sided carotid stenosis seen on duplex.  # Acute Stroke - MRI shows a 4mm acute R parietal cortex infarct.  Lipid panel reveals hyperlipidemia, but patient has no prior history of HTN (though BP somewhat elevated since admission), DM (A1C =  5.6), FH of CVA/MI, or smoking.  Stroke likely embolic from R carotid stenosis. -appreciate neuro assistance -aspirin 325 -Echo pending -telemetry -neuro checks -risk factor modification (see below)  # R Carotid Stenosis - Carotid duplex preliminary report shows >80% R carotid stenosis, which is likely the etiology for the patient's acute stroke. -[name] consulted, appreciate assistance -consider carotid endarterectomy  # Elevated Blood Pressure - the patient has had variable BP readings over the last 24 hours as he has been transferred to different facilities.  Unclear what patient's baseline BP reading truly is, and the patient has been out of the healthcare system for years. -permissive HTN for now, given stroke  # Hyperlipidemia - found on admission, LDL 141 -started statin  # Prophy - lovenox  Signed: Janalyn Harder MD  07/31/2011, 1:12 PM

## 2011-08-01 ENCOUNTER — Inpatient Hospital Stay (HOSPITAL_COMMUNITY): Payer: Medicare Other

## 2011-08-01 DIAGNOSIS — I63239 Cerebral infarction due to unspecified occlusion or stenosis of unspecified carotid arteries: Secondary | ICD-10-CM

## 2011-08-01 DIAGNOSIS — I6789 Other cerebrovascular disease: Secondary | ICD-10-CM

## 2011-08-01 DIAGNOSIS — G8190 Hemiplegia, unspecified affecting unspecified side: Secondary | ICD-10-CM

## 2011-08-01 LAB — BASIC METABOLIC PANEL
Chloride: 109 mEq/L (ref 96–112)
GFR calc Af Amer: 71 mL/min — ABNORMAL LOW (ref >90)
GFR calc non Af Amer: 61 mL/min — ABNORMAL LOW (ref >90)
Potassium: 4.3 mEq/L (ref 3.5–5.1)
Sodium: 144 mEq/L (ref 135–145)

## 2011-08-01 LAB — CBC
MCHC: 34.6 g/dL (ref 30.0–36.0)
RDW: 12.9 % (ref 11.5–15.5)
WBC: 6.3 10*3/uL (ref 4.0–10.5)

## 2011-08-01 LAB — TSH: TSH: 2.543 u[IU]/mL (ref 0.350–4.500)

## 2011-08-01 MED ORDER — IOHEXOL 350 MG/ML SOLN
80.0000 mL | Freq: Once | INTRAVENOUS | Status: AC | PRN
  Administered 2011-08-01: 80 mL via INTRAVENOUS

## 2011-08-01 NOTE — Progress Notes (Signed)
SLP Cancellation Note  ST received order for SLE and will address 08/02/11 Moreen Fowler MS, CCC-SLP [phone]  [facility] 08/01/2011, 5:06 PM

## 2011-08-01 NOTE — H&P (Signed)
39 man with 3 days of left face tingling and MR finding of a small parietal stroke.  Doppler shows > 80% carotid stenosis.  Neurology and vascular surgery are consulting.  Further imaging and likely carotid intervention pending.  No other active medical sx or dx.  Labs and VS are normal.  LDL is 141 and will be addressed with simvastatin.  ASA and enoxaparin started. Agree with resident plans. Appreciate consultants.

## 2011-08-01 NOTE — Progress Notes (Signed)
Admission History: Eduardo Shaw is an 66 y.o. male who reports that on Tuesday of this week he had an episode of numbness and tingling along the left side of the face and the left arm. Symptoms resolved. Symptoms returned on Wednesday and Thursday and became associated with the inability to perform fine motor movements, such as buttoning his shirt with his left hand as well. Even the repeated symptoms lasted no more than 20 minutes at a time. They all resolved spontaneously. Patient presented for evaluation at that time. Was sent to [facility] for TIA work up. In work up MRI of the brain was performed and shows a 4mm acute right parietal cortex infarct. MRA of the brain was unremarkable. Preliminary carotid doppler report shows greater than 80% stenosis of the RICA. LICA is patent.  LSN: 07/27/2011  tPA Given: No: Resolution of symptoms  Subjective: I feel fine.  No recurrent numbness since yesterday.  Objective: BP 132/75  Pulse 71  Temp 98 F (36.7 C) (Oral)  Resp 16  Ht 6\' 2"  (1.88 m)  Wt 90.719 kg (200 lb)  BMI 25.68 kg/m2  SpO2 93%  CBGs  Basename 07/30/11 1604  GLUCAP 73   Diet: regular, thin  Activity: up ad lib  DVT Prophylaxis: lovenox  Medications: Scheduled:   . aspirin  325 mg Oral Daily  . aspirin  325 mg Oral Once  . atorvastatin  40 mg Oral q1800  . enoxaparin  40 mg Subcutaneous Q24H  . sodium chloride  250 mL Intravenous Once   Neurologic Exam: Mental Status: Alert, oriented, thought content appropriate.  Speech fluent without evidence of aphasia. Able to follow 3 step commands without difficulty. Cranial Nerves: II- Visual fields grossly intact. III/IV/VI-Extraocular movements intact.  Pupils reactive bilaterally. V/VII-Smile symmetric VIII-hearing grossly intact XI-bilateral shoulder shrug XII-midline tongue extension Motor: 5/5 bilaterally with normal tone and bulk Sensory: Light touch intact throughout, bilaterally Deep Tendon Reflexes: 2+ and  symmetric throughout Plantars: Downgoing bilaterally Cerebellar: Normal finger-to-nose, normal rapid alternating movements and normal heel-to-shin test.   Lab Results: Basic Metabolic Panel:  Lab 08/01/11 1610 07/31/11 1521 07/30/11 1531  NA 144 -- 140  K 4.3 -- 4.0  CL 109 -- 104  CO2 26 -- 25  GLUCOSE 110* -- 87  BUN 22 -- 22  CREATININE 1.20 -- 1.10  CALCIUM 9.1 -- 9.6  MG -- 2.1 --  PHOS -- -- --   Liver Function Tests:  Lab 07/30/11 1531  AST 18  ALT 11  ALKPHOS 57  BILITOT 0.5  PROT 7.5  ALBUMIN 4.3   CBC:  Lab 08/01/11 0729 07/30/11 1531  WBC 6.3 6.8  NEUTROABS -- 4.0  HGB 14.9 15.7  HCT 43.1 44.4  MCV 89.6 87.9  PLT 171 203   Cardiac Enzymes:  Lab 07/30/11 1531  CKTOTAL 129  CKMB 2.9  CKMBINDEX --  TROPONINI <0.30   CBG:  Lab 07/30/11 1604  GLUCAP 73   Hemoglobin A1C:  Lab 07/30/11 1531  HGBA1C 5.6   Fasting Lipid Panel:  Lab 07/30/11 1531  CHOL 219*  HDL 64  LDLCALC 141*  TRIG 71  CHOLHDL 3.4  LDLDIRECT --   Coagulation:  Lab 07/30/11 1531  LABPROT 13.4  INR 1.00   Urine Drug Screen: Drugs of Abuse     Component Value Date/Time   LABOPIA NONE DETECTED 07/30/2011 1751   COCAINSCRNUR NONE DETECTED 07/30/2011 1751   LABBENZ NONE DETECTED 07/30/2011 1751   AMPHETMU NONE DETECTED 07/30/2011 1751  THCU NONE DETECTED 07/30/2011 1751   LABBARB NONE DETECTED 07/30/2011 1751    Misc. Labs  Study Results: 07/31/2011   CT HEAD WITHOUT CONTRAST   Findings: Ventricle size is normal.  Negative for intracranial hemorrhage.  MRI revealed a small area of acute infarction in the right parietal lobe, this is not seen on CT.  No infarct or mass is identified.  Calvarium intact.  IMPRESSION: No significant abnormality.  Small acute infarct right parietal lobe on MRI is not visualized on CT.   [name], M.D.   07/30/2011 CT HEAD WITHOUT CONTRAST   Findings: No acute intracranial abnormalities are identified, including mass lesion or mass  effect, hydrocephalus, extra-axial fluid collection, midline shift, hemorrhage, or acute infarction.  The visualized bony calvarium is unremarkable.  IMPRESSION: No evidence of acute intracranial abnormality.   [name], M.D.   07/30/2011 MRI HEAD  Findings:  4 mm focus of restricted diffusion in the right parietal cortex compatible with acute infarct.  No other acute infarct.  Scattered tiny white matter hyperintensities bilaterally compatible with chronic microvascular ischemia.  No cortical infarct. Brainstem and cerebellum are normal.  Negative for hemorrhage or mass lesion.  Chronic sinusitis.  Mucous retention cyst left maxillary sinus.  IMPRESSION: 4 mm acute infarct right parietal cortex.  Mild chronic microvascular ischemia. [name], M.D   07/30/2011 MRA HEAD  Findings: Both vertebral arteries are patent to the basilar.  PICA is patent bilaterally.  Superior cerebellar and posterior cerebral arteries are patent.  Fetal origin of the right posterior cerebral artery.  Internal carotid artery is widely patent bilaterally without stenosis.  Anterior and middle cerebral arteries are patent bilaterally.  Hypoplastic right A1 segment.  Negative for cerebral aneurysm.  IMPRESSION: Negative  [name], M.D.   Therapies: pending  Assessment/Plan: 66 y.o. male without known stroke risk factors who presents with a small right parietal infarct. Exam nonfocal. Carotid US revealed a greater than 80% right ICA stenosis. Based on acute infarct this stenosis would be considered symptomatic. Scheduled for CTA neck and vascular surgery consult.  Was not on antiplatelet therapy prior to admission.  LDL 141 A1c 5.6  Stroke Risk Factors - carotid stenosis, hyperlipidemia  Plan:  1. Echocardiogram pending  2. CT angio of the neck; vascular surgery has consulted and will make a decision regarding R CEA based on CTA. 3. Prophylactic therapy-Antiplatelet med: Aspirin - dose 325mg  daily  4. Risk  factor modification- agree with simvastatin.  5. Telemetry monitoring  6. Frequent neuro checks   Patient seen and examined by Dr. Lendell Caprice.   LOS: 2 days   Marya Fossa PA-C [facility] [phone] 08/01/2011  8:56 AM  I have seen and examined this patient and agree with the plan and examined as detailed above.  Kipp Laurence, MD [facility] Stroke Team [phone] 08/01/2011 9:41 AM

## 2011-08-01 NOTE — Progress Notes (Signed)
Denied numbness/tingling, but had series of questions which were addressed and some referred to MD who presented @ bedside and spoke to patient.

## 2011-08-01 NOTE — Progress Notes (Signed)
08/01/2011 Milana Kidney DPT PAGER: [phone] OFFICE: [phone]

## 2011-08-01 NOTE — Progress Notes (Signed)
  Echocardiogram 2D Echocardiogram has been performed.  Georgian Co 08/01/2011, 9:13 AM

## 2011-08-01 NOTE — Progress Notes (Signed)
Subjective: Yesterday evening, the patient experienced left-sided face numbness, and was taken to CT to rule out hemorrhagic conversion of known ischemic stroke, which was negative.  No numbness since that time.  No acute complaints this morning.  Objective: Vital signs in last 24 hours: Filed Vitals:   08/01/11 0000 08/01/11 0200 08/01/11 0600 08/01/11 1004  BP: 131/66 162/71 132/75 153/80  Pulse: 69 64 71 72  Temp: 98 F (36.7 C) 97.9 F (36.6 C) 98 F (36.7 C) 98.1 F (36.7 C)  TempSrc:    Oral  Resp: 16 16 16 18   Height:      Weight:      SpO2: 92% 94% 93% 93%   Weight change:  No intake or output data in the 24 hours ending 08/01/11 1112  PEX General: alert, cooperative, and in no apparent distress HEENT: pupils equal round and reactive to light, vision grossly intact, oropharynx clear and non-erythematous  Neck: supple, no lymphadenopathy, right carotid bruit appreciated Lungs: clear to ascultation bilaterally, normal work of respiration, no wheezes, rales, ronchi Heart: regular rate and rhythm, no murmurs, gallops, or rubs Abdomen: soft, non-tender, non-distended, normal bowel sounds  Extremities: no cyanosis, clubbing, or edema Neurologic: alert & oriented X3, cranial nerves II-XII intact, strength 5/5 throughout, sensation intact to light touch throughout, reflexes 2+ throughout  Lab Results: Basic Metabolic Panel:  Lab 08/01/11 4098 07/31/11 1521 07/30/11 1531  NA 144 -- 140  K 4.3 -- 4.0  CL 109 -- 104  CO2 26 -- 25  GLUCOSE 110* -- 87  BUN 22 -- 22  CREATININE 1.20 -- 1.10  CALCIUM 9.1 -- 9.6  MG -- 2.1 --  PHOS -- -- --   Liver Function Tests:  Lab 07/30/11 1531  AST 18  ALT 11  ALKPHOS 57  BILITOT 0.5  PROT 7.5  ALBUMIN 4.3   CBC:  Lab 08/01/11 0729 07/30/11 1531  WBC 6.3 6.8  NEUTROABS -- 4.0  HGB 14.9 15.7  HCT 43.1 44.4  MCV 89.6 87.9  PLT 171 203   Cardiac Enzymes:  Lab 07/30/11 1531  CKTOTAL 129  CKMB 2.9  CKMBINDEX --    TROPONINI <0.30   CBG:  Lab 07/30/11 1604  GLUCAP 73   Hemoglobin A1C:  Lab 07/30/11 1531  HGBA1C 5.6   Fasting Lipid Panel:  Lab 07/30/11 1531  CHOL 219*  HDL 64  LDLCALC 141*  TRIG 71  CHOLHDL 3.4  LDLDIRECT --   Coagulation:  Lab 07/30/11 1531  LABPROT 13.4  INR 1.00   Urine Drug Screen: Drugs of Abuse     Component Value Date/Time   LABOPIA NONE DETECTED 07/30/2011 1751   COCAINSCRNUR NONE DETECTED 07/30/2011 1751   LABBENZ NONE DETECTED 07/30/2011 1751   AMPHETMU NONE DETECTED 07/30/2011 1751   THCU NONE DETECTED 07/30/2011 1751   LABBARB NONE DETECTED 07/30/2011 1751     Studies/Results: Ct Head Wo Contrast  07/31/2011  *RADIOLOGY REPORT*  Clinical Data: Stroke.  Left facial numbness  CT HEAD WITHOUT CONTRAST  Technique:  Contiguous axial images were obtained from the base of the skull through the vertex without contrast.  Comparison: MRI head 07/30/2011  Findings: Ventricle size is normal.  Negative for intracranial hemorrhage.  MRI revealed a small area of acute infarction in the right parietal lobe, this is not seen on CT.  No infarct or mass is identified.  Calvarium intact.  IMPRESSION: No significant abnormality.  Small acute infarct right parietal lobe on MRI is not visualized  on CT.  Original Report Authenticated By: [name], M.D.   Ct Head Wo Contrast  07/30/2011  *RADIOLOGY REPORT*  Clinical Data: 66 year old male with left arm weakness.  CT HEAD WITHOUT CONTRAST  Technique:  Contiguous axial images were obtained from the base of the skull through the vertex without contrast.  Comparison: None  Findings: No acute intracranial abnormalities are identified, including mass lesion or mass effect, hydrocephalus, extra-axial fluid collection, midline shift, hemorrhage, or acute infarction.  The visualized bony calvarium is unremarkable.  IMPRESSION: No evidence of acute intracranial abnormality.  Original Report Authenticated By: Rosendo Gros, M.D.   Mr  Brain Wo Contrast  07/30/2011    *RADIOLOGY REPORT*  Clinical Data:  Left arm numbness.  Rule out CVA.  MRI HEAD WITHOUT CONTRAST MRA HEAD WITHOUT CONTRAST  Technique:  Multiplanar, multiecho pulse sequences of the brain and surrounding structures were obtained without intravenous contrast. Angiographic images of the head were obtained using MRA technique without contrast.  Comparison:  CT 07/30/2011  MRI HEAD  Findings:  4 mm focus of restricted diffusion in the right parietal cortex compatible with acute infarct.  No other acute infarct.  Scattered tiny white matter hyperintensities bilaterally compatible with chronic microvascular ischemia.  No cortical infarct. Brainstem and cerebellum are normal.  Negative for hemorrhage or mass lesion.  Chronic sinusitis.  Mucous retention cyst left maxillary sinus.  IMPRESSION: 4 mm acute infarct right parietal cortex.  Mild chronic microvascular ischemia.  MRA HEAD  Findings: Both vertebral arteries are patent to the basilar.  PICA is patent bilaterally.  Superior cerebellar and posterior cerebral arteries are patent.  Fetal origin of the right posterior cerebral artery.  Internal carotid artery is widely patent bilaterally without stenosis.  Anterior and middle cerebral arteries are patent bilaterally.  Hypoplastic right A1 segment.  Negative for cerebral aneurysm.  IMPRESSION: Negative  Original Report Authenticated By: [name], M.D.   Mr Mra Head/brain Wo Cm  07/30/2011    *RADIOLOGY REPORT*  Clinical Data:  Left arm numbness.  Rule out CVA.  MRI HEAD WITHOUT CONTRAST MRA HEAD WITHOUT CONTRAST  Technique:  Multiplanar, multiecho pulse sequences of the brain and surrounding structures were obtained without intravenous contrast. Angiographic images of the head were obtained using MRA technique without contrast.  Comparison:  CT 07/30/2011  MRI HEAD  Findings:  4 mm focus of restricted diffusion in the right parietal cortex compatible with acute infarct.  No other  acute infarct.  Scattered tiny white matter hyperintensities bilaterally compatible with chronic microvascular ischemia.  No cortical infarct. Brainstem and cerebellum are normal.  Negative for hemorrhage or mass lesion.  Chronic sinusitis.  Mucous retention cyst left maxillary sinus.  IMPRESSION: 4 mm acute infarct right parietal cortex.  Mild chronic microvascular ischemia.  MRA HEAD  Findings: Both vertebral arteries are patent to the basilar.  PICA is patent bilaterally.  Superior cerebellar and posterior cerebral arteries are patent.  Fetal origin of the right posterior cerebral artery.  Internal carotid artery is widely patent bilaterally without stenosis.  Anterior and middle cerebral arteries are patent bilaterally.  Hypoplastic right A1 segment.  Negative for cerebral aneurysm.  IMPRESSION: Negative  Original Report Authenticated By: [name], M.D.   Medications: I have reviewed the patient's current medications. Scheduled Meds:   . aspirin  325 mg Oral Daily  . aspirin  325 mg Oral Once  . atorvastatin  40 mg Oral q1800  . enoxaparin  40 mg Subcutaneous Q24H  .  sodium chloride  250 mL Intravenous Once   Continuous Infusions:  PRN Meds:.ondansetron (ZOFRAN) IV Assessment/Plan: The patient is a 66 yo man, presenting with an acute stroke, with R-sided carotid stenosis seen on duplex.   # Acute Stroke - MRI shows a 4mm acute R parietal cortex infarct. Lipid panel reveals hyperlipidemia, but patient has no prior history of HTN (though BP somewhat elevated since admission), DM (A1C = 5.6), FH of CVA/MI, or smoking. Stroke likely from R carotid stenosis.  -appreciate neuro assistance  -aspirin 325  -Echo pending  -telemetry  -neuro checks  -risk factor modification (see below)   # R Carotid Stenosis - Carotid duplex preliminary report shows >80% R carotid stenosis, which is likely the etiology for the patient's acute stroke.  -CTA neck pending -[name] consulted, appreciate  assistance  -consider carotid endarterectomy   # Elevated Blood Pressure - the patient has had variable BP readings over the last 24 hours as he has been transferred to different facilities. Unclear what patient's baseline BP reading truly is, and the patient has been out of the healthcare system for years.  -permissive HTN for now, given stroke   # Hyperlipidemia - found on admission, LDL 141  -started statin   # Prophy - lovenox   LOS: 2 days   Janalyn Harder 08/01/2011, 11:12 AM

## 2011-08-01 NOTE — Progress Notes (Signed)
PT/OT Screen: PT/OT consults received and appreciated. All pt's symptoms have resolved and pt.  Functioning at baseline with mobility and ADLs at this time. No acute skilled needs noted and will sign off acutely. Thanks!  Cassandria Anger, OTR/L Pager: [phone] 08/01/2011 .

## 2011-08-01 NOTE — Consult Note (Signed)
[facility] [place]      Consult Note  Patient name: LAWRNCE REYEZ MRN: [id number] DOB: [birth year] Sex: male  Consulting Physician:  Teaching service  Reason for Consult:  Chief Complaint  Patient presents with  . Numbness    HISTORY OF PRESENT ILLNESS: This is a very pleasant 66 year old gentleman who who on Wednesday experienced an episode of left-sided face arm and hand numbness. He also had difficulty using his left hand to button his shirt. He had a similar episode on Thursday and on Friday which prompted him to seek medical evaluation. His symptoms continue to resolve after the acute episode. He underwent a MRI of the brain which showed an acute right parietal cortex infarct, approximately 4 mm with no surrounding blood or edema. A carotid duplex revealed greater than 80% right carotid stenosis. He was started on aspirin.  He is also found to have elevated cholesterol and was started on a statin. Yesterday he had one additional symptom which was similar to his original symptoms. A CT scan was performed which was negative. He denies any other symptoms such as dysarthria or amaurosis. He has had elevated blood pressure while in the hospital. He has no history of coronary artery disease. He is a nonsmoker and has never smoked.   Past Medical History  Diagnosis Date  . Hypercholesterolemia   . Arthritis of foot     Past Surgical History  Procedure Date  . Mohs surgery     to face    History   Social History  . Marital Status: Married    Spouse Name: N/A    Number of Children: N/A  . Years of Education: N/A   Occupational History  . Not on file.   Social History Main Topics  . Smoking status: Never Smoker   . Smokeless tobacco: Not on file  . Alcohol Use: Yes     1-2 drinks on weekends  . Drug Use: No  . Sexually Active: Not on file   Other Topics Concern  . 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  Finished high school.  Can read and write.  Hasn't seen a doctor in many years.    Family History  Problem Relation Age of Onset  . Lupus Mother   . Lupus Sister     Allergies as of 07/30/2011  . (No Known Allergies)    No current facility-administered medications on file prior to encounter.   No current outpatient prescriptions on file prior to encounter.     REVIEW OF SYSTEMS:  please see review of systems from H&P on 6/22.there have been no changes other than what is described in the history of present illness.   PHYSICAL EXAMINATION: General: The patient appears their stated age.  Vital signs are BP 153/80  Pulse 72  Temp 98.1 F (36.7 C) (Oral)  Resp 18  Ht 6\' 2"  (1.88 m)  Wt 200 lb (90.719 kg)  BMI 25.68 kg/m2  SpO2 93% Pulmonary: Respirations are non-labored HEENT:  No gross abnormalities Abdomen: Soft and non-tender  Musculoskeletal: There are no major deformities.   Neurologic: No focal weakness or paresthesias are detected, Skin: There are no ulcer or rashes noted. Psychiatric: The patient has normal affect. Cardiovascular: There is a regular rate and rhythm without significant murmur appreciated.  Diagnostic Studies:  I have reviewed his MRA of the brain which shows  a 4 mm parietal infarct . I've also reviewed his carotid ultrasound which reveals greater than 80% right carotid stenosis and 40-59% stenosis on the left     Assessment:   symptomatic right carotid stenosis  Plan: I had a lengthy conversation with the patient and his wife. I feel that his carotid disease is the etiology for his stroke. We discussed options for management which include medical therapy, versus carotid endarterectomy, versus carotid stenting. We have decided to proceed with right carotid endarterectomy to be performed mid-week. We discussed the risks of surgery which include the risk of stroke, nerve injury, cardiopulmonary complications,  bleeding, and infection. All of his questions were answered. I will touch with the patient tomorrow to determine the date of this procedure. Depending on the date of his surgery we could potentially entertain having him discharged to home and returning for his operation. Ideally, however, I would like him to stay in the hospital until his surgery.      Jorge Ny, M.D. [facility] [place] Office: [phone] Pager:  [phone]

## 2011-08-01 NOTE — ED Provider Notes (Signed)
Medical screening examination/treatment/procedure(s) were performed by non-physician practitioner and as supervising physician I was immediately available for consultation/collaboration.    Nelia Shi, MD 08/01/11 [phone]

## 2011-08-02 DIAGNOSIS — I6350 Cerebral infarction due to unspecified occlusion or stenosis of unspecified cerebral artery: Secondary | ICD-10-CM

## 2011-08-02 DIAGNOSIS — I6529 Occlusion and stenosis of unspecified carotid artery: Secondary | ICD-10-CM

## 2011-08-02 LAB — CBC
HCT: 42.5 % (ref 39.0–52.0)
Hemoglobin: 14.7 g/dL (ref 13.0–17.0)
MCH: 31 pg (ref 26.0–34.0)
MCHC: 34.6 g/dL (ref 30.0–36.0)

## 2011-08-02 LAB — BASIC METABOLIC PANEL
BUN: 23 mg/dL (ref 6–23)
CO2: 25 mEq/L (ref 19–32)
Chloride: 108 mEq/L (ref 96–112)
Glucose, Bld: 99 mg/dL (ref 70–99)
Potassium: 3.8 mEq/L (ref 3.5–5.1)

## 2011-08-02 MED ORDER — CEFAZOLIN SODIUM 1-5 GM-% IV SOLN
1.0000 g | INTRAVENOUS | Status: AC
  Administered 2011-08-03 (×2): 1 g via INTRAVENOUS
  Filled 2011-08-02: qty 50

## 2011-08-02 MED ORDER — SODIUM CHLORIDE 0.9 % IV SOLN
INTRAVENOUS | Status: DC
  Administered 2011-08-02 – 2011-08-03 (×2): via INTRAVENOUS
  Administered 2011-08-03: 500 mL via INTRAVENOUS

## 2011-08-02 MED ORDER — SODIUM CHLORIDE 0.9 % IV BOLUS (SEPSIS)
500.0000 mL | Freq: Once | INTRAVENOUS | Status: AC
  Administered 2011-08-02: 500 mL via INTRAVENOUS

## 2011-08-02 MED ORDER — SODIUM CHLORIDE 0.9 % IV SOLN: INTRAVENOUS | Status: DC

## 2011-08-02 NOTE — Progress Notes (Signed)
Subjective: No further complaints of numbness, tingling, or weakness overnight.  Creatinine has risen somewhat this morning, likely secondary to IV contrast from CT neck.  BP's in 140's-150's overnight.  Per vascular surgery, plan for CEA tomorrow.  Objective: Vital signs in last 24 hours: Filed Vitals:   08/01/11 2200 08/02/11 0200 08/02/11 0548 08/02/11 1026  BP: 136/78 135/74 151/82 132/81  Pulse: 74 75 74 69  Temp: 97.9 F (36.6 C) 97.9 F (36.6 C) 98 F (36.7 C) 97.8 F (36.6 C)  TempSrc: Oral Oral Oral Oral  Resp: 18 18 20 18   Height:      Weight:      SpO2: 93% 93% 94% 96%   Weight change:  No intake or output data in the 24 hours ending 08/02/11 1222  PEX General: alert, cooperative, and in no apparent distress HEENT: pupils equal round and reactive to light, vision grossly intact, oropharynx clear and non-erythematous  Neck: supple, no lymphadenopathy, right carotid bruit appreciated Lungs: clear to ascultation bilaterally, normal work of respiration, no wheezes, rales, ronchi Heart: regular rate and rhythm, no murmurs, gallops, or rubs Abdomen: soft, non-tender, non-distended, normal bowel sounds  Extremities: no cyanosis, clubbing, or edema Neurologic: alert & oriented X3, cranial nerves II-XII intact, strength 5/5 throughout, sensation intact to light touch throughout, reflexes 2+ throughout  Lab Results: Basic Metabolic Panel:  Lab 08/02/11 4098 08/01/11 0729 07/31/11 1521  NA 143 144 --  K 3.8 4.3 --  CL 108 109 --  CO2 25 26 --  GLUCOSE 99 110* --  BUN 23 22 --  CREATININE 1.27 1.20 --  CALCIUM 8.9 9.1 --  MG -- -- 2.1  PHOS -- -- --   Liver Function Tests:  Lab 07/30/11 1531  AST 18  ALT 11  ALKPHOS 57  BILITOT 0.5  PROT 7.5  ALBUMIN 4.3   CBC:  Lab 08/02/11 0615 08/01/11 0729 07/30/11 1531  WBC 6.6 6.3 --  NEUTROABS -- -- 4.0  HGB 14.7 14.9 --  HCT 42.5 43.1 --  MCV 89.7 89.6 --  PLT 175 171 --   Cardiac Enzymes:  Lab 07/30/11  1531  CKTOTAL 129  CKMB 2.9  CKMBINDEX --  TROPONINI <0.30   CBG:  Lab 07/30/11 1604  GLUCAP 73   Hemoglobin A1C:  Lab 07/30/11 1531  HGBA1C 5.6   Fasting Lipid Panel:  Lab 07/30/11 1531  CHOL 219*  HDL 64  LDLCALC 141*  TRIG 71  CHOLHDL 3.4  LDLDIRECT --   Coagulation:  Lab 07/30/11 1531  LABPROT 13.4  INR 1.00   Urine Drug Screen: Drugs of Abuse     Component Value Date/Time   LABOPIA NONE DETECTED 07/30/2011 1751   COCAINSCRNUR NONE DETECTED 07/30/2011 1751   LABBENZ NONE DETECTED 07/30/2011 1751   AMPHETMU NONE DETECTED 07/30/2011 1751   THCU NONE DETECTED 07/30/2011 1751   LABBARB NONE DETECTED 07/30/2011 1751     Studies/Results: Ct Head Wo Contrast  07/31/2011  *RADIOLOGY REPORT*  Clinical Data: Stroke.  Left facial numbness  CT HEAD WITHOUT CONTRAST  Technique:  Contiguous axial images were obtained from the base of the skull through the vertex without contrast.  Comparison: MRI head 07/30/2011  Findings: Ventricle size is normal.  Negative for intracranial hemorrhage.  MRI revealed a small area of acute infarction in the right parietal lobe, this is not seen on CT.  No infarct or mass is identified.  Calvarium intact.  IMPRESSION: No significant abnormality.  Small acute infarct right  parietal lobe on MRI is not visualized on CT.  Original Report Authenticated By: [name], M.D.   Ct Angio Neck W/cm &/or Wo/cm  08/01/2011  *RADIOLOGY REPORT*  Clinical Data:  Left facial numbness.  CVA  CT ANGIOGRAPHY NECK  Technique:  Multidetector CT imaging of the neck was performed using the standard protocol during bolus administration of intravenous contrast.  Multiplanar CT image reconstructions including MIPs were obtained to evaluate the vascular anatomy. Carotid stenosis measurements (when applicable) are obtained utilizing NASCET criteria, using the distal internal carotid diameter as the denominator.  Contrast: 80mL OMNIPAQUE IOHEXOL 350 MG/ML SOLN  Comparison:   MRI 07/30/2011  Findings:  Negative for mass or adenopathy.  Ground-glass density the lungs bilaterally may represent edema or possibly infection.  Standard branching pattern of the aortic arch.  Proximal great vessels are patent with without significant stenosis.  Right carotid:  Right common carotid artery is patent.  Mixed density calcified and noncalcified plaque in the proximal right internal carotid artery. 50% diameter stenosis of the proximal internal carotid artery.  Approximately 15 mm above this, there is a more focal area of stenosis with a focal area of severe narrowing measuring 1.1 mm in diameter,  this corresponds to 80% diameter stenosis.  Right internal carotid artery is patent to the skull base.  Left carotid:  The left common carotid artery is widely patent. Calcified and soft plaque involving the left carotid bulb narrowing the lumen by approximately 40% diameter stenosis.  Left internal carotid artery is patent to the skull base without additional stenosis.  Mild stenosis at the origin of the external carotid artery.  Vertebral artery:  Calcified plaque at the origin of the left vertebral artery with a 50% diameter stenosis.  Left vertebral artery is patent without additional stenosis.  40% diameter stenosis at the origin of the right vertebral artery.  Right vertebral artery is patent without additional stenosis.   Review of the MIP images confirms the above findings.  IMPRESSION: High-grade stenosis proximal right internal carotid artery measuring up to 80% diameter stenosis.  40% diameter stenosis proximal left internal carotid artery.  Approximately 40% diameter stenosis of the proximal right vertebral artery and a 50% diameter stenosis of the proximal left vertebral artery.  Original Report Authenticated By: [name], M.D.   Medications: I have reviewed the patient's current medications. Scheduled Meds:    . aspirin  325 mg Oral Daily  . atorvastatin  40 mg Oral q1800  .   ceFAZolin (ANCEF) IV  1 g Intravenous On Call  . enoxaparin  40 mg Subcutaneous Q24H  . sodium chloride  500 mL Intravenous Once   Continuous Infusions:    . sodium chloride 100 mL/hr at 08/02/11 1113  . DISCONTD: sodium chloride     PRN Meds:. Assessment/Plan: The patient is a 66 yo man, presenting with an acute stroke, with R-sided carotid stenosis seen on duplex.   # Acute Stroke - MRI shows a 4mm acute R parietal cortex infarct. Lipid panel reveals hyperlipidemia, but patient has no prior history of HTN (though BP somewhat elevated since admission), DM (A1C = 5.6), FH of CVA/MI, or smoking. Stroke likely from R carotid stenosis.  -appreciate neuro assistance  -aspirin 325  -Echo shows no wall motion abnormalities, EF 55-60% -continue telemetry   -risk factor modification (see below)   # R Carotid Stenosis - Carotid duplex preliminary report shows >80% R carotid stenosis, which is likely the etiology for the patient's acute  stroke. CTA neck confirms findings, with 40% L carotid stenosis as well. -vascular surgery consulting, greatly appreciate management -plan for R CEA tomorrow  # Elevated Blood Pressure - the patient's BP has been somewhat elevated since admission.  However, in light of carotid stenosis, will wait on lowering BP until after CEA. -no intervention at this time   # Hyperlipidemia - found on admission, LDL 141  -continue atorvastatin  # Prophy - lovenox   LOS: 3 days   Janalyn Harder 08/02/2011, 12:22 PM

## 2011-08-02 NOTE — Progress Notes (Signed)
UR COMPLETED  

## 2011-08-02 NOTE — Progress Notes (Signed)
Admission History: Eduardo Shaw is an 66 y.o. male who reports that on Tuesday of this week he had an episode of numbness and tingling along the left side of the face and the left arm. Symptoms resolved. Symptoms returned on Wednesday and Thursday and became associated with the inability to perform fine motor movements, such as buttoning his shirt with his left hand as well. Even the repeated symptoms lasted no more than 20 minutes at a time. They all resolved spontaneously. Patient presented for evaluation at that time. Was sent to [facility] for TIA work up. In work up MRI of the brain was performed and shows a 4mm acute right parietal cortex infarct. MRA of the brain was unremarkable. Preliminary carotid doppler report shows greater than 80% stenosis of the RICA. LICA is patent.  LSN: 07/27/2011  tPA Given: No: Resolution of symptoms  Subjective: Wife at bedside.No recurrent symptoms.  Objective: BP 132/81  Pulse 69  Temp 97.8 F (36.6 C) (Oral)  Resp 18  Ht 6\' 2"  (1.88 m)  Wt 90.719 kg (200 lb)  BMI 25.68 kg/m2  SpO2 96%  CBGs Basename 07/30/11 1604  GLUCAP 73   Diet: regular, thin  Activity: up ad lib  DVT Prophylaxis: lovenox  Medications: Scheduled:    . aspirin  325 mg Oral Daily  . atorvastatin  40 mg Oral q1800  .  ceFAZolin (ANCEF) IV  1 g Intravenous On Call  . enoxaparin  40 mg Subcutaneous Q24H  . sodium chloride  500 mL Intravenous Once   Neurologic Exam: Mental Status: Alert, oriented, thought content appropriate.  Speech fluent without evidence of aphasia. Able to follow 3 step commands without difficulty. Cranial Nerves: II- Visual fields grossly intact.Fundi not visualized. Visual acuity intact. III/IV/VI-Extraocular movements intact.  Pupils reactive bilaterally. V/VII-Smile symmetric VIII-hearing grossly intact XI-bilateral shoulder shrug XII-midline tongue extension Motor: 5/5 bilaterally with normal tone and bulk Sensory: Light touch intact  throughout, bilaterally Deep Tendon Reflexes: 2+ and symmetric throughout Plantars: Downgoing bilaterally Cerebellar: Normal finger-to-nose, normal rapid alternating movements and normal heel-to-shin test.   Lab Results: Basic Metabolic Panel:  Lab 08/02/11 1610 08/01/11 0729 07/31/11 1521  NA 143 144 --  K 3.8 4.3 --  CL 108 109 --  CO2 25 26 --  GLUCOSE 99 110* --  BUN 23 22 --  CREATININE 1.27 1.20 --  CALCIUM 8.9 9.1 --  MG -- -- 2.1  PHOS -- -- --   Liver Function Tests:  Lab 07/30/11 1531  AST 18  ALT 11  ALKPHOS 57  BILITOT 0.5  PROT 7.5  ALBUMIN 4.3   CBC:  Lab 08/02/11 0615 08/01/11 0729 07/30/11 1531  WBC 6.6 6.3 --  NEUTROABS -- -- 4.0  HGB 14.7 14.9 --  HCT 42.5 43.1 --  MCV 89.7 89.6 --  PLT 175 171 --   Cardiac Enzymes:  Lab 07/30/11 1531  CKTOTAL 129  CKMB 2.9  CKMBINDEX --  TROPONINI <0.30   CBG:  Lab 07/30/11 1604  GLUCAP 73   Hemoglobin A1C:  Lab 07/30/11 1531  HGBA1C 5.6   Fasting Lipid Panel:  Lab 07/30/11 1531  CHOL 219*  HDL 64  LDLCALC 141*  TRIG 71  CHOLHDL 3.4  LDLDIRECT --   Coagulation:  Lab 07/30/11 1531  LABPROT 13.4  INR 1.00   Urine Drug Screen: Drugs of Abuse     Component Value Date/Time   LABOPIA NONE DETECTED 07/30/2011 1751   COCAINSCRNUR NONE DETECTED 07/30/2011 1751  LABBENZ NONE DETECTED 07/30/2011 1751   AMPHETMU NONE DETECTED 07/30/2011 1751   THCU NONE DETECTED 07/30/2011 1751   LABBARB NONE DETECTED 07/30/2011 1751    Misc. Labs  Study Results: 07/31/2011   CT HEAD WITHOUT CONTRAST   Findings: Ventricle size is normal.  Negative for intracranial hemorrhage.  MRI revealed a small area of acute infarction in the right parietal lobe, this is not seen on CT.  No infarct or mass is identified.  Calvarium intact.  IMPRESSION: No significant abnormality.  Small acute infarct right parietal lobe on MRI is not visualized on CT.   [name], M.D.   07/30/2011 CT HEAD WITHOUT CONTRAST   Findings:  No acute intracranial abnormalities are identified, including mass lesion or mass effect, hydrocephalus, extra-axial fluid collection, midline shift, hemorrhage, or acute infarction.  The visualized bony calvarium is unremarkable.  IMPRESSION: No evidence of acute intracranial abnormality.   [name], M.D.   07/30/2011 MRI HEAD  Findings:  4 mm focus of restricted diffusion in the right parietal cortex compatible with acute infarct.  No other acute infarct.  Scattered tiny white matter hyperintensities bilaterally compatible with chronic microvascular ischemia.  No cortical infarct. Brainstem and cerebellum are normal.  Negative for hemorrhage or mass lesion.  Chronic sinusitis.  Mucous retention cyst left maxillary sinus.  IMPRESSION: 4 mm acute infarct right parietal cortex.  Mild chronic microvascular ischemia. [name], M.D   07/30/2011 MRA HEAD  Findings: Both vertebral arteries are patent to the basilar.  PICA is patent bilaterally.  Superior cerebellar and posterior cerebral arteries are patent.  Fetal origin of the right posterior cerebral artery.  Internal carotid artery is widely patent bilaterally without stenosis.  Anterior and middle cerebral arteries are patent bilaterally.  Hypoplastic right A1 segment.  Negative for cerebral aneurysm.  IMPRESSION: Negative  [name], M.D.   2D echo EF 55-60% with no source of embolus.   Therapies: pending  Assessment/Plan: 66 y.o. male without known stroke risk factors who presents with a small right parietal infarct. Exam nonfocal. Carotid US revealed a greater than 80% right ICA stenosis. Based on acute infarct this stenosis would be considered symptomatic. [name] was consulted. Plans for OR prior to discharge. Was not on antiplatelet therapy prior to admission. Now on ASA 325 mg daily.  - Symptomatic right ICA stenosis -Hyperlipidemia, LDL 141, on lipitor  Plan:  -agree with R CEA for tomorrow -continue  Aspirin - dose 325mg   daily  -ongoing risk factor modification -Consider ACCELERATE trial, use of Cholesteryl Ester Transfer Protein (CETP) Inhibitor: Potential of Evacetrapib to treat atherosclerosis and CAD. [facility] will contact patient with information about trial, screen for inclusion.    LOS: 3 days   SHARON BIBY, AVNP, ANP-BC, GNP-BC [facility] Pager: [phone] 08/02/2011 12:00 PM  Dr. Delia Heady, Stroke Center Medical Director, has personally reviewed chart, pertinent data, examined the patient and developed the plan of care. Pager:  [phone]

## 2011-08-02 NOTE — Progress Notes (Signed)
Speech Pathology Screen Note  Screening of cognitive-linguistic abilities revealed all to be [facility].  No testing necessary.  No skilled SLP services.  Myra Rude, M.S.,CCC-SLP Pager [phone] 08/02/2011, 3:46 PM

## 2011-08-02 NOTE — Progress Notes (Addendum)
Internal Medicine Attending  Date: 08/02/2011  Patient name: Eduardo Shaw Medical record number: [id number] Date of birth: 1945-12-01 Age: 66 y.o. Gender: male  I saw and evaluated the patient on a.m. rounds with house staff. I reviewed the resident's note by Dr. Manson Passey and I agree with the resident's findings and plans as documented in his note.

## 2011-08-02 NOTE — Progress Notes (Signed)
VASCULAR PROGRESS NOTE  SUBJECTIVE: No complaints. No new symptoms.  PHYSICAL EXAM: Filed Vitals:   08/01/11 2200 08/02/11 0200 08/02/11 0548 08/02/11 1026  BP: 136/78 135/74 151/82 132/81  Pulse: 74 75 74 69  Temp: 97.9 F (36.6 C) 97.9 F (36.6 C) 98 F (36.7 C) 97.8 F (36.6 C)  TempSrc: Oral Oral Oral Oral  Resp: 18 18 20 18  Height:      Weight:      SpO2: 93% 93% 94% 96%   Neuro exam: good strength in upper and lower extremities bilaterally.  LABS: Lab Results  Component Value Date   WBC 6.6 08/02/2011   HGB 14.7 08/02/2011   HCT 42.5 08/02/2011   MCV 89.7 08/02/2011   PLT 175 08/02/2011   Lab Results  Component Value Date   CREATININE 1.27 08/02/2011   Lab Results  Component Value Date   INR 1.00 07/30/2011   CBG (last 3)   Basename 07/30/11 1604  GLUCAP 73   ASSESSMENT/PLAN: 1. Plan to proceed with right CEA tomorrow. I have reviewed the indications for carotid endarterectomy, that is to lower the risk of future stroke. I have also reviewed the potential complications of surgery, including but not limited to: bleeding, stroke (perioperative risk 1-2%), MI, nerve injury of other unpredictable medical problems. All of the patients questions were answered and they are agreeable to proceed with surgery.   Kerry Chisolm, MD, FACS Beeper: [phone] 08/02/2011    

## 2011-08-03 ENCOUNTER — Telehealth: Payer: Self-pay | Admitting: Vascular Surgery

## 2011-08-03 ENCOUNTER — Inpatient Hospital Stay (HOSPITAL_COMMUNITY): Payer: Medicare Other | Admitting: Anesthesiology

## 2011-08-03 ENCOUNTER — Encounter (HOSPITAL_COMMUNITY): Payer: Self-pay | Admitting: Anesthesiology

## 2011-08-03 ENCOUNTER — Encounter (HOSPITAL_COMMUNITY)
Admission: EM | Disposition: A | Discharge status: Home / Self Care | Payer: Self-pay | Source: Home / Self Care | Attending: Internal Medicine

## 2011-08-03 DIAGNOSIS — I63239 Cerebral infarction due to unspecified occlusion or stenosis of unspecified carotid arteries: Secondary | ICD-10-CM

## 2011-08-03 DIAGNOSIS — G8190 Hemiplegia, unspecified affecting unspecified side: Secondary | ICD-10-CM

## 2011-08-03 HISTORY — PX: ENDARTERECTOMY: SHX5162

## 2011-08-03 HISTORY — PX: CAROTID ENDARTERECTOMY: SUR193

## 2011-08-03 LAB — BASIC METABOLIC PANEL
CO2: 28 mEq/L (ref 19–32)
Calcium: 8.8 mg/dL (ref 8.4–10.5)
Chloride: 108 mEq/L (ref 96–112)
Creatinine, Ser: 1.29 mg/dL (ref 0.50–1.35)
Glucose, Bld: 101 mg/dL — ABNORMAL HIGH (ref 70–99)

## 2011-08-03 LAB — CBC
HCT: 41.2 % (ref 39.0–52.0)
MCH: 31.2 pg (ref 26.0–34.0)
MCV: 90 fL (ref 78.0–100.0)
Platelets: 161 10*3/uL (ref 150–400)
RBC: 4.58 MIL/uL (ref 4.22–5.81)

## 2011-08-03 SURGERY — ENDARTERECTOMY, CAROTID
Anesthesia: General | Site: Neck | Laterality: Right | Wound class: Clean

## 2011-08-03 MED ORDER — FENTANYL CITRATE 0.05 MG/ML IJ SOLN: 50.0000 ug | INTRAMUSCULAR | Status: DC | PRN

## 2011-08-03 MED ORDER — ONDANSETRON HCL 4 MG/2ML IJ SOLN
INTRAMUSCULAR | Status: DC | PRN
  Administered 2011-08-03: 4 mg via INTRAVENOUS

## 2011-08-03 MED ORDER — MORPHINE SULFATE 2 MG/ML IJ SOLN
INTRAMUSCULAR | Status: AC
  Filled 2011-08-03: qty 1

## 2011-08-03 MED ORDER — PROPOFOL 10 MG/ML IV EMUL
INTRAVENOUS | Status: DC | PRN
  Administered 2011-08-03: 170 mg via INTRAVENOUS

## 2011-08-03 MED ORDER — LABETALOL HCL 5 MG/ML IV SOLN: 10.0000 mg | INTRAVENOUS | Status: DC | PRN

## 2011-08-03 MED ORDER — DEXTRAN 40 IN SALINE 10-0.9 % IV SOLN
INTRAVENOUS | Status: AC
  Filled 2011-08-03: qty 500

## 2011-08-03 MED ORDER — LACTATED RINGERS IV SOLN
INTRAVENOUS | Status: DC | PRN
  Administered 2011-08-03 (×2): via INTRAVENOUS

## 2011-08-03 MED ORDER — DOPAMINE-DEXTROSE 3.2-5 MG/ML-% IV SOLN
3.0000 ug/kg/min | INTRAVENOUS | Status: DC
  Administered 2011-08-03: 5 ug/kg/min via INTRAVENOUS
  Filled 2011-08-03: qty 250

## 2011-08-03 MED ORDER — NEOSTIGMINE METHYLSULFATE 1 MG/ML IJ SOLN
INTRAMUSCULAR | Status: DC | PRN
  Administered 2011-08-03: 3 mg via INTRAVENOUS

## 2011-08-03 MED ORDER — HYDROMORPHONE HCL PF 1 MG/ML IJ SOLN
0.2500 mg | INTRAMUSCULAR | Status: DC | PRN
  Administered 2011-08-03: 0.5 mg via INTRAVENOUS

## 2011-08-03 MED ORDER — PROMETHAZINE HCL 25 MG/ML IJ SOLN
6.2500 mg | INTRAMUSCULAR | Status: DC | PRN
  Filled 2011-08-03: qty 1

## 2011-08-03 MED ORDER — ATORVASTATIN CALCIUM 40 MG PO TABS: 40.0000 mg | ORAL_TABLET | Freq: Every day | ORAL | Status: DC

## 2011-08-03 MED ORDER — ASPIRIN 325 MG PO TABS: 325.0000 mg | ORAL_TABLET | Freq: Every day | ORAL | Status: DC

## 2011-08-03 MED ORDER — PANTOPRAZOLE SODIUM 40 MG PO TBEC
40.0000 mg | DELAYED_RELEASE_TABLET | Freq: Every day | ORAL | Status: DC
  Administered 2011-08-03: 40 mg via ORAL
  Filled 2011-08-03: qty 1

## 2011-08-03 MED ORDER — ACETAMINOPHEN 325 MG PO TABS
325.0000 mg | ORAL_TABLET | ORAL | Status: DC | PRN
  Administered 2011-08-04: 650 mg via ORAL
  Filled 2011-08-03: qty 2

## 2011-08-03 MED ORDER — EPHEDRINE SULFATE 50 MG/ML IJ SOLN
INTRAMUSCULAR | Status: DC | PRN
  Administered 2011-08-03: 5 mg via INTRAVENOUS

## 2011-08-03 MED ORDER — MEPERIDINE HCL 25 MG/ML IJ SOLN: 6.2500 mg | INTRAMUSCULAR | Status: DC | PRN

## 2011-08-03 MED ORDER — DOPAMINE-DEXTROSE 3.2-5 MG/ML-% IV SOLN
INTRAVENOUS | Status: AC
  Filled 2011-08-03: qty 250

## 2011-08-03 MED ORDER — TEMAZEPAM 15 MG PO CAPS: 15.0000 mg | ORAL_CAPSULE | Freq: Every evening | ORAL | Status: DC | PRN

## 2011-08-03 MED ORDER — SODIUM CHLORIDE 0.9 % IR SOLN
Status: DC | PRN
  Administered 2011-08-03: 09:00:00

## 2011-08-03 MED ORDER — OXYCODONE HCL 5 MG PO TABS: 5.0000 mg | ORAL_TABLET | ORAL | Status: DC | PRN

## 2011-08-03 MED ORDER — MIDAZOLAM HCL 2 MG/2ML IJ SOLN: 1.0000 mg | INTRAMUSCULAR | Status: DC | PRN

## 2011-08-03 MED ORDER — DOCUSATE SODIUM 100 MG PO CAPS
100.0000 mg | ORAL_CAPSULE | Freq: Every day | ORAL | Status: DC
  Administered 2011-08-04: 100 mg via ORAL
  Filled 2011-08-03: qty 1

## 2011-08-03 MED ORDER — ALUM & MAG HYDROXIDE-SIMETH 200-200-20 MG/5ML PO SUSP: 15.0000 mL | ORAL | Status: DC | PRN

## 2011-08-03 MED ORDER — GUAIFENESIN-DM 100-10 MG/5ML PO SYRP: 15.0000 mL | ORAL_SOLUTION | ORAL | Status: DC | PRN

## 2011-08-03 MED ORDER — HYDROMORPHONE HCL PF 1 MG/ML IJ SOLN
INTRAMUSCULAR | Status: AC
  Filled 2011-08-03: qty 1

## 2011-08-03 MED ORDER — MORPHINE SULFATE 2 MG/ML IJ SOLN: 2.0000 mg | INTRAMUSCULAR | Status: DC | PRN

## 2011-08-03 MED ORDER — ONDANSETRON HCL 4 MG/2ML IJ SOLN
4.0000 mg | Freq: Four times a day (QID) | INTRAMUSCULAR | Status: DC | PRN
  Administered 2011-08-03: 4 mg via INTRAVENOUS
  Filled 2011-08-03: qty 2

## 2011-08-03 MED ORDER — FENTANYL CITRATE 0.05 MG/ML IJ SOLN
INTRAMUSCULAR | Status: DC | PRN
  Administered 2011-08-03 (×2): 50 ug via INTRAVENOUS

## 2011-08-03 MED ORDER — POTASSIUM CHLORIDE CRYS ER 20 MEQ PO TBCR: 20.0000 meq | EXTENDED_RELEASE_TABLET | Freq: Once | ORAL | Status: AC | PRN

## 2011-08-03 MED ORDER — PROTAMINE SULFATE 10 MG/ML IV SOLN
INTRAVENOUS | Status: DC | PRN
  Administered 2011-08-03 (×4): 10 mg via INTRAVENOUS

## 2011-08-03 MED ORDER — LIDOCAINE-EPINEPHRINE (PF) 1 %-1:200000 IJ SOLN
INTRAMUSCULAR | Status: AC
  Filled 2011-08-03: qty 10

## 2011-08-03 MED ORDER — LIDOCAINE HCL (CARDIAC) 20 MG/ML IV SOLN
INTRAVENOUS | Status: DC | PRN
  Administered 2011-08-03: 100 mg via INTRAVENOUS

## 2011-08-03 MED ORDER — HYDRALAZINE HCL 20 MG/ML IJ SOLN: 10.0000 mg | INTRAMUSCULAR | Status: DC | PRN

## 2011-08-03 MED ORDER — LIDOCAINE-EPINEPHRINE (PF) 1 %-1:200000 IJ SOLN
INTRAMUSCULAR | Status: DC | PRN
  Administered 2011-08-03: 30 mL

## 2011-08-03 MED ORDER — ENOXAPARIN SODIUM 40 MG/0.4ML ~~LOC~~ SOLN
40.0000 mg | SUBCUTANEOUS | Status: DC
  Filled 2011-08-03: qty 0.4

## 2011-08-03 MED ORDER — HYDROMORPHONE HCL PF 1 MG/ML IJ SOLN: 1.0000 mg | INTRAMUSCULAR | Status: DC | PRN

## 2011-08-03 MED ORDER — HEPARIN SODIUM (PORCINE) 1000 UNIT/ML IJ SOLN
INTRAMUSCULAR | Status: DC | PRN
  Administered 2011-08-03: 9000 [IU] via INTRAVENOUS

## 2011-08-03 MED ORDER — ROCURONIUM BROMIDE 100 MG/10ML IV SOLN
INTRAVENOUS | Status: DC | PRN
  Administered 2011-08-03: 10 mg via INTRAVENOUS
  Administered 2011-08-03: 55 mg via INTRAVENOUS

## 2011-08-03 MED ORDER — METOPROLOL TARTRATE 1 MG/ML IV SOLN: 2.0000 mg | INTRAVENOUS | Status: DC | PRN

## 2011-08-03 MED ORDER — LACTATED RINGERS IV SOLN: INTRAVENOUS | Status: DC

## 2011-08-03 MED ORDER — OXYCODONE HCL 5 MG PO TABS: 5.0000 mg | ORAL_TABLET | Freq: Four times a day (QID) | ORAL | Status: DC | PRN

## 2011-08-03 MED ORDER — ACETAMINOPHEN 650 MG RE SUPP: 325.0000 mg | RECTAL | Status: DC | PRN

## 2011-08-03 MED ORDER — PHENOL 1.4 % MT LIQD
1.0000 | OROMUCOSAL | Status: DC | PRN
  Filled 2011-08-03: qty 177

## 2011-08-03 MED ORDER — ACETAMINOPHEN 325 MG PO TABS
ORAL_TABLET | ORAL | Status: AC
  Filled 2011-08-03: qty 1

## 2011-08-03 MED ORDER — DEXTROSE 5 % IV SOLN
1.5000 g | Freq: Two times a day (BID) | INTRAVENOUS | Status: AC
  Administered 2011-08-03 – 2011-08-04 (×2): 1.5 g via INTRAVENOUS
  Filled 2011-08-03 (×2): qty 1.5

## 2011-08-03 MED ORDER — GLYCOPYRROLATE 0.2 MG/ML IJ SOLN
INTRAMUSCULAR | Status: DC | PRN
  Administered 2011-08-03: 0.2 mg via INTRAVENOUS
  Administered 2011-08-03: 0.4 mg via INTRAVENOUS

## 2011-08-03 MED ORDER — DOPAMINE-DEXTROSE 3.2-5 MG/ML-% IV SOLN: 3.0000 ug/kg/min | INTRAVENOUS | Status: DC

## 2011-08-03 MED ORDER — DEXTRAN 40 IN SALINE 10-0.9 % IV SOLN
INTRAVENOUS | Status: DC | PRN
  Administered 2011-08-03: 500 mL

## 2011-08-03 MED ORDER — KETOROLAC TROMETHAMINE 30 MG/ML IJ SOLN: 15.0000 mg | Freq: Once | INTRAMUSCULAR | Status: DC | PRN

## 2011-08-03 MED ORDER — PHENYLEPHRINE HCL 10 MG/ML IJ SOLN
10.0000 mg | INTRAVENOUS | Status: DC | PRN
  Administered 2011-08-03: 20 ug/min via INTRAVENOUS

## 2011-08-03 MED ORDER — SODIUM CHLORIDE 0.9 % IV SOLN
500.0000 mL | Freq: Once | INTRAVENOUS | Status: AC | PRN
  Administered 2011-08-03: 500 mL via INTRAVENOUS

## 2011-08-03 MED ORDER — 0.9 % SODIUM CHLORIDE (POUR BTL) OPTIME
TOPICAL | Status: DC | PRN
  Administered 2011-08-03: 2000 mL

## 2011-08-03 MED ORDER — CEFAZOLIN SODIUM 1-5 GM-% IV SOLN
INTRAVENOUS | Status: AC
  Filled 2011-08-03: qty 50

## 2011-08-03 SURGICAL SUPPLY — 48 items
BAG DECANTER FOR FLEXI CONT (MISCELLANEOUS) ×2 IMPLANT
CANISTER SUCTION 2500CC (MISCELLANEOUS) ×2 IMPLANT
CANNULA VESSEL W/WING WO/VALVE (CANNULA) ×2 IMPLANT
CATH ROBINSON RED A/P 18FR (CATHETERS) ×2 IMPLANT
CLIP TI MEDIUM 24 (CLIP) ×2 IMPLANT
CLIP TI WIDE RED SMALL 24 (CLIP) ×2 IMPLANT
CLOTH BEACON ORANGE TIMEOUT ST (SAFETY) ×2 IMPLANT
COVER SURGICAL LIGHT HANDLE (MISCELLANEOUS) ×4 IMPLANT
CRADLE DONUT ADULT HEAD (MISCELLANEOUS) ×2 IMPLANT
DERMABOND ADVANCED (GAUZE/BANDAGES/DRESSINGS) ×1
DERMABOND ADVANCED .7 DNX12 (GAUZE/BANDAGES/DRESSINGS) ×1 IMPLANT
DRAIN CHANNEL 15F RND FF W/TCR (WOUND CARE) IMPLANT
DRAPE WARM FLUID 44X44 (DRAPE) ×2 IMPLANT
ELECT REM PT RETURN 9FT ADLT (ELECTROSURGICAL) ×2
ELECTRODE REM PT RTRN 9FT ADLT (ELECTROSURGICAL) ×1 IMPLANT
EVACUATOR SILICONE 100CC (DRAIN) IMPLANT
GLOVE BIO SURGEON STRL SZ 6.5 (GLOVE) ×4 IMPLANT
GLOVE BIO SURGEON STRL SZ7.5 (GLOVE) ×2 IMPLANT
GLOVE BIOGEL PI IND STRL 6.5 (GLOVE) ×3 IMPLANT
GLOVE BIOGEL PI IND STRL 7.5 (GLOVE) ×1 IMPLANT
GLOVE BIOGEL PI INDICATOR 6.5 (GLOVE) ×3
GLOVE BIOGEL PI INDICATOR 7.5 (GLOVE) ×1
GLOVE ECLIPSE 6.5 STRL STRAW (GLOVE) ×2 IMPLANT
GOWN STRL NON-REIN LRG LVL3 (GOWN DISPOSABLE) ×6 IMPLANT
KIT BASIN OR (CUSTOM PROCEDURE TRAY) ×2 IMPLANT
KIT ROOM TURNOVER OR (KITS) ×2 IMPLANT
NEEDLE HYPO 25X1 1.5 SAFETY (NEEDLE) ×4 IMPLANT
NS IRRIG 1000ML POUR BTL (IV SOLUTION) ×4 IMPLANT
PACK CAROTID (CUSTOM PROCEDURE TRAY) ×2 IMPLANT
PAD ARMBOARD 7.5X6 YLW CONV (MISCELLANEOUS) ×4 IMPLANT
PATCH HEMASHIELD 8X150 (Vascular Products) ×2 IMPLANT
SHUNT CAROTID BYPASS 10 (VASCULAR PRODUCTS) ×2 IMPLANT
SHUNT CAROTID BYPASS 12FRX15.5 (VASCULAR PRODUCTS) IMPLANT
SPECIMEN JAR SMALL (MISCELLANEOUS) ×2 IMPLANT
SPONGE SURGIFOAM ABS GEL 100 (HEMOSTASIS) IMPLANT
SUT PROLENE 6 0 BV (SUTURE) ×4 IMPLANT
SUT PROLENE 7 0 BV 1 (SUTURE) ×2 IMPLANT
SUT SILK 2 0 FS (SUTURE) IMPLANT
SUT SILK 3 0 (SUTURE) ×1
SUT SILK 3-0 18XBRD TIE 12 (SUTURE) ×1 IMPLANT
SUT VIC AB 3-0 SH 27 (SUTURE) ×1
SUT VIC AB 3-0 SH 27X BRD (SUTURE) ×1 IMPLANT
SUT VICRYL 4-0 PS2 18IN ABS (SUTURE) ×2 IMPLANT
SYR CONTROL 10ML LL (SYRINGE) ×4 IMPLANT
TOWEL OR 17X24 6PK STRL BLUE (TOWEL DISPOSABLE) ×4 IMPLANT
TOWEL OR 17X26 10 PK STRL BLUE (TOWEL DISPOSABLE) ×2 IMPLANT
TRAY FOLEY CATH 14FRSI W/METER (CATHETERS) ×2 IMPLANT
WATER STERILE IRR 1000ML POUR (IV SOLUTION) ×2 IMPLANT

## 2011-08-03 NOTE — Telephone Encounter (Signed)
Message copied by Fredrich Birks on Tue Aug 03, 2011 12:37 PM ------      Message from: Melene Plan      Created: Tue Aug 03, 2011 12:11 PM      Regarding: RE: charge and follow up       YA THINK??? YES      ----- Message -----         From: Fredrich Birks         Sent: 08/03/2011  11:28 AM           To: Melene Plan, RN      Subject: RE: charge and follow up                                 Ok to schedule 3 wks? CSD overbooked in 2 wks. (i know this is getting old).            Annabelle Harman            ----- Message -----         From: Melene Plan, RN         Sent: 08/03/2011  10:40 AM           To: Cheryll Dessert Pool      Subject: FW: charge and follow up                                             ----- Message -----         From: Chuck Hint, MD         Sent: 08/03/2011  10:23 AM           To: Reuel Derby, Melene Plan, RN      Subject: charge and follow up                                     PROCEDURE: right carotid endarterectomy with Dacron patch angioplasty            SURGEON: Di Kindle. Edilia Bo, MD, FACS            ASSIST: Doreatha Massed, PA            He will need a follow up visit in 2 weeks. Thank you.

## 2011-08-03 NOTE — Progress Notes (Signed)
Dopamine decreased from 5 mcg/kg/min to 4 MCG/KG/MIN for art BP 201/84 and cuff BP of 161/54

## 2011-08-03 NOTE — Preoperative (Signed)
Beta Blockers   Reason not to administer Beta Blockers:Not Applicable 

## 2011-08-03 NOTE — Progress Notes (Signed)
Stroke Team Progress Note    Admission History: Eduardo Shaw is an 66 y.o. male who reports that on Tuesday of this week he had an episode of numbness and tingling along the left side of the face and the left arm. Symptoms resolved. Symptoms returned on Wednesday and Thursday and became associated with the inability to perform fine motor movements, such as buttoning his shirt with his left hand as well. Even the repeated symptoms lasted no more than 20 minutes at a time. They all resolved spontaneously. Patient presented for evaluation at that time. Was sent to [facility] for TIA work up. In work up MRI of the brain was performed and shows a 4mm acute right parietal cortex infarct. MRA of the brain was unremarkable. Preliminary carotid doppler report shows greater than 80% stenosis of the RICA. LICA is patent.  LSN: 07/27/2011  tPA Given: No: Resolution of symptoms  Subjective: Currently, the patient is recovering in PACU, has no acute complaints. Rt CEA went well.  Interval Events: - Patient underwent right carotid endarterectomy this AM.   Objective: BP 146/74  Pulse 72  Temp 97.6 F (36.4 C) (Oral)  Resp 18  Ht 6\' 2"  (1.88 m)  Wt 200 lb (90.719 kg)  BMI 25.68 kg/m2  SpO2 95%  CBGsNo results found for this basename: GLUCAP:10 in the last 72 hours Diet: regular, thin  Activity: up ad lib  DVT Prophylaxis: lovenox  Medications: Scheduled:    . aspirin  325 mg Oral Daily  . atorvastatin  40 mg Oral q1800  .  ceFAZolin (ANCEF) IV  1 g Intravenous On Call  . enoxaparin  40 mg Subcutaneous Q24H  . sodium chloride  500 mL Intravenous Once   Neurologic Exam: Mental Status: Alert, oriented, thought content appropriate.  Speech fluent without evidence of aphasia. Able to follow 3 step commands without difficulty. Cranial Nerves: II- Visual fields grossly intact. Visual acuity intact. III/IV/VI-Extraocular movements intact.  Pupils reactive bilaterally. V/VII-Smile  symmetric VIII-hearing grossly intact XI-bilateral shoulder shrug XII-midline tongue extension Motor: 5/5 bilaterally with normal tone and bulk Sensory: Light touch intact throughout, bilaterally Deep Tendon Reflexes: 2+ and symmetric throughout Plantars: Downgoing bilaterally Cerebellar: Normal finger-to-nose, normal rapid alternating movements and normal heel-to-shin test.   Lab Results: Basic Metabolic Panel:  Lab 08/03/11 5366 08/02/11 0615 07/31/11 1521  NA 142 143 --  K 4.0 3.8 --  CL 108 108 --  CO2 28 25 --  GLUCOSE 101* 99 --  BUN 23 23 --  CREATININE 1.29 1.27 --  CALCIUM 8.8 8.9 --  MG -- -- 2.1  PHOS -- -- --   Liver Function Tests:  Lab 07/30/11 1531  AST 18  ALT 11  ALKPHOS 57  BILITOT 0.5  PROT 7.5  ALBUMIN 4.3   CBC:  Lab 08/03/11 0540 08/02/11 0615 07/30/11 1531  WBC 7.2 6.6 --  NEUTROABS -- -- 4.0  HGB 14.3 14.7 --  HCT 41.2 42.5 --  MCV 90.0 89.7 --  PLT 161 175 --   Cardiac Enzymes:  Lab 07/30/11 1531  CKTOTAL 129  CKMB 2.9  CKMBINDEX --  TROPONINI <0.30   CBG:  Lab 07/30/11 1604  GLUCAP 73   Hemoglobin A1C:  Lab 07/30/11 1531  HGBA1C 5.6   Fasting Lipid Panel:  Lab 07/30/11 1531  CHOL 219*  HDL 64  LDLCALC 141*  TRIG 71  CHOLHDL 3.4  LDLDIRECT --   Coagulation:  Lab 07/30/11 1531  LABPROT 13.4  INR 1.00  Urine Drug Screen: Drugs of Abuse     Component Value Date/Time   LABOPIA NONE DETECTED 07/30/2011 1751   COCAINSCRNUR NONE DETECTED 07/30/2011 1751   LABBENZ NONE DETECTED 07/30/2011 1751   AMPHETMU NONE DETECTED 07/30/2011 1751   THCU NONE DETECTED 07/30/2011 1751   LABBARB NONE DETECTED 07/30/2011 1751    Misc. Labs  Study Results: 07/31/2011   CT HEAD WITHOUT CONTRAST   Findings: Ventricle size is normal.  Negative for intracranial hemorrhage.  MRI revealed a small area of acute infarction in the right parietal lobe, this is not seen on CT.  No infarct or mass is identified.  Calvarium intact.  IMPRESSION:  No significant abnormality.  Small acute infarct right parietal lobe on MRI is not visualized on CT.   [name], M.D.   07/30/2011 CT HEAD WITHOUT CONTRAST   Findings: No acute intracranial abnormalities are identified, including mass lesion or mass effect, hydrocephalus, extra-axial fluid collection, midline shift, hemorrhage, or acute infarction.  The visualized bony calvarium is unremarkable.  IMPRESSION: No evidence of acute intracranial abnormality.   [name], M.D.   07/30/2011 MRI HEAD  Findings:  4 mm focus of restricted diffusion in the right parietal cortex compatible with acute infarct.  No other acute infarct.  Scattered tiny white matter hyperintensities bilaterally compatible with chronic microvascular ischemia.  No cortical infarct. Brainstem and cerebellum are normal.  Negative for hemorrhage or mass lesion.  Chronic sinusitis.  Mucous retention cyst left maxillary sinus.  IMPRESSION: 4 mm acute infarct right parietal cortex.  Mild chronic microvascular ischemia. [name], M.D   07/30/2011 MRA HEAD  Findings: Both vertebral arteries are patent to the basilar.  PICA is patent bilaterally.  Superior cerebellar and posterior cerebral arteries are patent.  Fetal origin of the right posterior cerebral artery.  Internal carotid artery is widely patent bilaterally without stenosis.  Anterior and middle cerebral arteries are patent bilaterally.  Hypoplastic right A1 segment.  Negative for cerebral aneurysm.  IMPRESSION: Negative  [name], M.D.   2D echo EF 55-60% with no source of embolus.   Therapies: pending  Assessment/Plan: 66 y.o. male without known stroke risk factors who presents with a small right parietal infarct. Exam nonfocal. Carotid US revealed a greater than 80% right ICA stenosis. Based on acute infarct this stenosis would be considered symptomatic. [name] was consulted. Right carotid endarterectomy performed on 08/03/2011. Was not on antiplatelet therapy  prior to admission. Now on ASA 325 mg daily.  - Symptomatic right ICA stenosis s/p CEA  - Hyperlipidemia, LDL 141, on lipitor (newly started on admission)   Plan:  -continue  Aspirin - dose 325mg  daily and Lipitor -ongoing risk factor modification -Consider ACCELERATE trial, use of Cholesteryl Ester Transfer Protein (CETP) Inhibitor: Potential of Evacetrapib to treat atherosclerosis and CAD. [facility] will contact patient with information about trial, screen for inclusion.  -Recommend follow-up with Dr. Pearlean Brownie as an outpatient, 2 months after discharge, patient should call for an appointment.   LOS: 4 days   Signed: Johnette Abraham, Erline Levine, Internal Medicine Resident Pager: [phone] (7AM-5PM) 08/03/2011, 7:44 AM     Dr. Delia Heady, Stroke Center Medical Director, has personally reviewed chart, pertinent data, examined the patient and developed the plan of care.   Delia Heady, MD Medical Director [facility] Pager: [phone] 08/03/2011 2:28 PM

## 2011-08-03 NOTE — Progress Notes (Signed)
VASCULAR POST OP NOTE  SUBJECTIVE: nausea after MSO4.  PHYSICAL EXAM: Filed Vitals:   08/03/11 1430 08/03/11 1440 08/03/11 1445 08/03/11 1455  BP:  120/48  133/51  Pulse: 54 52 53 56  Temp:      TempSrc:      Resp: 10 13 11 13   Height:      Weight:      SpO2: 92% 91% 96% 97%   Incision fine Neuro intact  LABS: Lab Results  Component Value Date   WBC 7.2 08/03/2011   HGB 14.3 08/03/2011   HCT 41.2 08/03/2011   MCV 90.0 08/03/2011   PLT 161 08/03/2011   ASSESSMENT/PLAN: 1. Doing well post op. 2. Ween Dopamine off.  3. Change MSO4 to Dilaudid  Waverly Ferrari, MD, FACS Beeper: [phone] 08/03/2011

## 2011-08-03 NOTE — Anesthesia Postprocedure Evaluation (Signed)
  Anesthesia Post-op Note  Patient: Eduardo Shaw  Procedure(s) Performed: Procedure(s) (LRB): ENDARTERECTOMY CAROTID (Right)  Patient Location: PACU  Anesthesia Type: General  Level of Consciousness: awake  Airway and Oxygen Therapy: Patient Spontanous Breathing  Post-op Pain: mild  Post-op Assessment: Post-op Vital signs reviewed  Post-op Vital Signs: stable  Complications: No apparent anesthesia complications

## 2011-08-03 NOTE — Progress Notes (Signed)
Subjective: Patient s/p CEA today.  Patient having post-op bradycardia to the 40's, which has now improved to the 50's, on a dopamine drip.  Patient fully alert and oriented.  Patient moved from PACU to stepdown.  Objective: Vital signs in last 24 hours: Filed Vitals:   08/03/11 1430 08/03/11 1440 08/03/11 1445 08/03/11 1455  BP:  120/48  133/51  Pulse: 54 52 53 56  Temp:      TempSrc:      Resp: 10 13 11 13   Height:      Weight:      SpO2: 92% 91% 96% 97%   Weight change:   Intake/Output Summary (Last 24 hours) at 08/03/11 1535 Last data filed at 08/03/11 1430  Gross per 24 hour  Intake   1600 ml  Output   1320 ml  Net    280 ml    PEX General: drowsy, cooperative, NAD HEENT: pupils equal round and reactive to light, vision grossly intact, oropharynx clear and non-erythematous. Neck: supple, right neck incision clean/dry/intact Lungs: clear to ascultation bilaterally, normal work of respiration, no wheezes, rales, ronchi Heart: bradycardic, regular rhythm, no murmurs, gallops, or rubs Abdomen: soft, non-tender, non-distended, normal bowel sounds  Extremities: no cyanosis, clubbing, or edema Neurologic: alert & oriented X3, cranial nerves II-XII intact, strength 5/5 throughout, sensation intact to light touch throughout, reflexes 2+ throughout  Lab Results: Basic Metabolic Panel:  Lab 08/03/11 0454 08/02/11 0615 07/31/11 1521  NA 142 143 --  K 4.0 3.8 --  CL 108 108 --  CO2 28 25 --  GLUCOSE 101* 99 --  BUN 23 23 --  CREATININE 1.29 1.27 --  CALCIUM 8.8 8.9 --  MG -- -- 2.1  PHOS -- -- --   Liver Function Tests:  Lab 07/30/11 1531  AST 18  ALT 11  ALKPHOS 57  BILITOT 0.5  PROT 7.5  ALBUMIN 4.3   CBC:  Lab 08/03/11 0540 08/02/11 0615 07/30/11 1531  WBC 7.2 6.6 --  NEUTROABS -- -- 4.0  HGB 14.3 14.7 --  HCT 41.2 42.5 --  MCV 90.0 89.7 --  PLT 161 175 --   Cardiac Enzymes:  Lab 07/30/11 1531  CKTOTAL 129  CKMB 2.9  CKMBINDEX --  TROPONINI  <0.30   CBG:  Lab 07/30/11 1604  GLUCAP 73   Hemoglobin A1C:  Lab 07/30/11 1531  HGBA1C 5.6   Fasting Lipid Panel:  Lab 07/30/11 1531  CHOL 219*  HDL 64  LDLCALC 141*  TRIG 71  CHOLHDL 3.4  LDLDIRECT --   Coagulation:  Lab 07/30/11 1531  LABPROT 13.4  INR 1.00   Urine Drug Screen: Drugs of Abuse     Component Value Date/Time   LABOPIA NONE DETECTED 07/30/2011 1751   COCAINSCRNUR NONE DETECTED 07/30/2011 1751   LABBENZ NONE DETECTED 07/30/2011 1751   AMPHETMU NONE DETECTED 07/30/2011 1751   THCU NONE DETECTED 07/30/2011 1751   LABBARB NONE DETECTED 07/30/2011 1751     Studies/Results: No results found. Medications: I have reviewed the patient's current medications. Scheduled Meds:    . acetaminophen      . aspirin  325 mg Oral Daily  . atorvastatin  40 mg Oral q1800  .  ceFAZolin (ANCEF) IV  1 g Intravenous On Call  . DOPamine      . enoxaparin  40 mg Subcutaneous Q24H  . HYDROmorphone       Continuous Infusions:    . sodium chloride 500 mL (08/03/11 1148)  . DOPamine    .  DISCONTD: DOPamine 5 mcg/kg/min (08/03/11 1149)   PRN Meds:. Assessment/Plan: The patient is a 66 yo man, presenting with an acute stroke, with R-sided carotid stenosis seen on duplex.   # Acute Stroke - MRI shows a 4mm acute R parietal cortex infarct. Lipid panel reveals hyperlipidemia, but patient has no prior history of HTN (though BP somewhat elevated since admission), DM (A1C = 5.6), FH of CVA/MI, or smoking. Stroke likely from R carotid stenosis.  -appreciate neuro assistance  -aspirin 325  -Echo shows no wall motion abnormalities, EF 55-60% -continue telemetry   -risk factor modification (see below)   # R Carotid Stenosis - Carotid duplex and CTA show >80% R carotid stenosis, which is likely the etiology for the patient's acute stroke. 40% L carotid stenosis as well.  Patient is POD #0 s/p R CEA. -vascular surgery consulting, greatly appreciate management -plan per vascular  surgery  # Bradycardia - post-op bradycardia, likely representing lingering effects of anesthesia vs baroreceptor trauma during neck surgery.  Patient currently stable on dopamine drip. -continue dopamine drip, started by vascular surgery, titrate to BP and HR goals -continue IVF -if patient becomes less responsive, low threshold for calling PCCM.  But patient currently fully stable.  # Elevated Blood Pressure - the patient's BP has been somewhat elevated since admission.  However, in light of carotid stenosis, will wait on lowering BP until after CEA. -no intervention at this time   # Hyperlipidemia - found on admission, LDL 141  -continue atorvastatin  # Prophy - lovenox   LOS: 4 days   Janalyn Harder 08/03/2011, 3:35 PM

## 2011-08-03 NOTE — Transfer of Care (Signed)
Immediate Anesthesia Transfer of Care Note  Patient: Eduardo Shaw  Procedure(s) Performed: Procedure(s) (LRB): ENDARTERECTOMY CAROTID (Right)  Patient Location: PACU  Anesthesia Type: General  Level of Consciousness: awake, alert  and oriented  Airway & Oxygen Therapy: Patient Spontanous Breathing and Patient connected to nasal cannula oxygen  Post-op Assessment: Report given to PACU RN, Post -op Vital signs reviewed and stable, Patient moving all extremities and Patient able to stick tongue midline  Post vital signs: Reviewed and stable  Complications: No apparent anesthesia complications

## 2011-08-03 NOTE — Anesthesia Preprocedure Evaluation (Addendum)
Anesthesia Evaluation  Patient identified by MRN, date of birth, ID band Patient awake    Reviewed: Allergy & Precautions, H&P , NPO status , Patient's Chart, lab work & pertinent test results  Airway Mallampati: II  Neck ROM: Full    Dental   Pulmonary neg pulmonary ROS,  breath sounds clear to auscultation        Cardiovascular Rhythm:Regular Rate:Normal     Neuro/Psych CVA negative psych ROS   GI/Hepatic negative GI ROS, Neg liver ROS,   Endo/Other    Renal/GU negative Renal ROS     Musculoskeletal negative musculoskeletal ROS (+)   Abdominal   Peds  Hematology negative hematology ROS (+)   Anesthesia Other Findings   Reproductive/Obstetrics                         Anesthesia Physical Anesthesia Plan  ASA: II  Anesthesia Plan: General   Post-op Pain Management:    Induction: Intravenous  Airway Management Planned: Oral ETT  Additional Equipment: Arterial line  Intra-op Plan:   Post-operative Plan: Extubation in OR  Informed Consent: I have reviewed the patients History and Physical, chart, labs and discussed the procedure including the risks, benefits and alternatives for the proposed anesthesia with the patient or authorized representative who has indicated his/her understanding and acceptance.   Dental advisory given  Plan Discussed with: Anesthesiologist, Surgeon and CRNA  Anesthesia Plan Comments:       Anesthesia Quick Evaluation

## 2011-08-03 NOTE — Telephone Encounter (Signed)
Left message for patient regarding appt, dpm

## 2011-08-03 NOTE — Discharge Summary (Signed)
[facility] Discharge Summary  SCHYLAR WUEBKER 1945/08/28 66 y.o. male  [id number]  Admission Date: 07/30/2011  Discharge Date: 07/31/11  Physician: Farley Ly, MD  Admission Diagnosis: CVA (cerebral infarction) [434.91] CVA Right ICA Stenosis CVA   HPI:   This is a 66 y.o. male who who on Wednesday experienced an episode of left-sided face arm and hand numbness. He also had difficulty using his left hand to button his shirt. He had a similar episode on Thursday and on Friday which prompted him to seek medical evaluation. His symptoms continue to resolve after the acute episode. He underwent a MRI of the brain which showed an acute right parietal cortex infarct, approximately 4 mm with no surrounding blood or edema. A carotid duplex revealed greater than 80% right carotid stenosis. He was started on aspirin. He is also found to have elevated cholesterol and was started on a statin. Yesterday he had one additional symptom which was similar to his original symptoms. A CT scan was performed which was negative. He denies any other symptoms such as dysarthria or amaurosis. He has had elevated blood pressure while in the hospital. He has no history of coronary artery disease. He is a nonsmoker and has never smoked.   Hospital Course:  The patient was admitted to the hospital and taken to the operating room on 07/30/2011 - 08/03/2011 and underwent right carotid endarterectomy.  The pt tolerated the procedure well and was transported to the PACU in good condition.   By POD 1, the pt neuro status is in tact and he is doing well.  He did have nausea post operatively due to the morphine, but this resolved when the morphine was discontinued.  The remainder of the hospital course consisted of increasing mobilization and increasing intake of solids without difficulty.    Basename 08/03/11 0540 08/02/11 0615  NA 142 143  K 4.0 3.8  CL 108 108  CO2 28 25  GLUCOSE 101* 99    BUN 23 23  CALCIUM 8.8 8.9    Basename 08/03/11 0540 08/02/11 0615  WBC 7.2 6.6  HGB 14.3 14.7  HCT 41.2 42.5  PLT 161 175   No results found for this basename: INR:2 in the last 72 hours   Discharge Instructions:   The patient is discharged to home with extensive instructions on wound care and progressive ambulation.  They are instructed not to drive or perform any heavy lifting until returning to see the physician in his office.  Discharge Orders    Future Orders Please Complete By Expires   Resume previous diet      Driving Restrictions      Comments:   No driving for 2 weeks   Lifting restrictions      Comments:   No lifting for 6 weeks   Call MD for:  temperature >100.5      Call MD for:  redness, tenderness, or signs of infection (pain, swelling, bleeding, redness, odor or green/yellow discharge around incision site)      Call MD for:  severe or increased pain, loss or decreased feeling  in affected limb(s)      Discharge wound care:      Comments:   Shower daily with soap and water starting 08/05/11   CAROTID Sugery: Call MD for difficulty swallowing or speaking; weakness in arms or legs that is a new symtom; severe headache.  If you have increased swelling in the neck and/or  are having difficulty  breathing, CALL 911         Discharge Diagnosis:  CVA (cerebral infarction) [434.91] CVA Right ICA Stenosis CVA  Secondary Diagnosis: Patient Active Problem List  Diagnosis  . SARCOIDOSIS  . Nonspecific (abnormal) findings on radiological and other examination of body structure  . ABNORMAL RADIOLOGIAL EXAMINATION  . Stroke  . Carotid stenosis   Past Medical History  Diagnosis Date  . Hypercholesterolemia   . Arthritis of foot      Bentley, Fissel  Home Medication Instructions [id number]   Printed on:08/03/11 1008  Medication Information                    aspirin 325 MG tablet Take 1 tablet (325 mg total) by mouth daily.           atorvastatin  (LIPITOR) 40 MG tablet Take 1 tablet (40 mg total) by mouth daily at 6 PM.           oxyCODONE (ROXICODONE) 5 MG immediate release tablet Take 1 tablet (5 mg total) by mouth every 6 (six) hours as needed for pain. #30 NR            Disposition: home  Patient's condition: is Good  Follow up: 1. Dr.  Edilia Bo in 3 weeks. 2. Dr. Pearlean Brownie in 2 months.   Doreatha Massed, PA-C [facility] [phone] 08/03/2011  10:08 AM

## 2011-08-03 NOTE — Progress Notes (Signed)
Paged and spoke with Dr. Pearlean Brownie, who is following [name].  Dr. Pearlean Brownie said that the low HR is often a sign of a vagal response to the surgery, and that it will correct in time.  No new orders were received.

## 2011-08-03 NOTE — Interval H&P Note (Signed)
History and Physical Interval Note:  08/03/2011 7:21 AM  Eduardo Shaw  has presented today for surgery, with the diagnosis of Right ICA Stenosis CVA  The various methods of treatment have been discussed with the patient and family. After consideration of risks, benefits and other options for treatment, the patient has consented to: RIGHT  CAROTID ENDARTERECTOMY.  The patient's history has been reviewed, patient examined, no change in status, stable for surgery.  I have reviewed the patients' chart and labs.  Questions were answered to the patient's satisfaction.     [name] S

## 2011-08-03 NOTE — Progress Notes (Signed)
Dr. Edilia Bo notified of low BP after 500 cc NS bolus.  Ordered to give second 500 cc NS bolus, with Dopamine at 63mcg/kg/min.  These orders were initiated immediately.  Have noted an increase in BP without a concurrent increase in heart rate.   Monitoring closely for theraputic increase in HR.

## 2011-08-03 NOTE — Consult Note (Signed)
ANTIBIOTIC CONSULT NOTE - INITIAL  Pharmacy Consult for Cefuroxime Indication: s/p CEA  No Known Allergies  Patient Measurements: Height: 6\' 2"  (188 cm) Weight: 200 lb (90.719 kg) IBW/kg (Calculated) : 82.2    Vital Signs: Temp: 98.7 F (37.1 C) (06/25 1025) Temp src: Oral (06/25 0600) BP: 133/51 mmHg (06/25 1455) Pulse Rate: 56  (06/25 1455)  Labs:  Basename 08/03/11 0540 08/02/11 0615 08/01/11 0729  WBC 7.2 6.6 6.3  HGB 14.3 14.7 14.9  PLT 161 175 171  LABCREA -- -- --  CREATININE 1.29 1.27 1.20   Estimated Creatinine Clearance: 64.6 ml/min (by C-G formula based on Cr of 1.29).    Microbiology: Recent Results (from the past 720 hour(s))  MRSA PCR SCREENING     Status: Normal   Collection Time   08/02/11  4:57 PM      Component Value Range Status Comment   MRSA by PCR NEGATIVE  NEGATIVE Final     Medical History: Past Medical History  Diagnosis Date  . Hypercholesterolemia   . Arthritis of foot     Medications:  Scheduled:    . acetaminophen      . aspirin  325 mg Oral Daily  . atorvastatin  40 mg Oral q1800  .  ceFAZolin (ANCEF) IV  1 g Intravenous On Call  . cefUROXime (ZINACEF)  IV  1.5 g Intravenous Q12H  . docusate sodium  100 mg Oral Daily  . DOPamine      . enoxaparin  40 mg Subcutaneous Q24H  . HYDROmorphone      . morphine      . pantoprazole  40 mg Oral Q1200  . DISCONTD: enoxaparin  40 mg Subcutaneous Q24H   Anti-infectives     Start     Dose/Rate Route Frequency Ordered Stop   08/03/11 1700   cefUROXime (ZINACEF) 1.5 g in dextrose 5 % 50 mL IVPB        1.5 g 100 mL/hr over 30 Minutes Intravenous Every 12 hours 08/03/11 1610 08/04/11 1659   08/03/11 0000   ceFAZolin (ANCEF) IVPB 1 g/50 mL premix     Comments: Send with pt to OR      1 g 100 mL/hr over 30 Minutes Intravenous On call 08/02/11 1159 08/03/11 0727          Assessment:  66 y/o male s/p R-CEA ordered Cefuroxime post-op.  Pharmacy to adjust doses if  required. Estimated Creatinine Clearance: 64.6 ml/min (by C-G formula based on Cr of 1.29).  No dosing adjustments needed at this time.  Plan:   Continue Cefuroxime 1.5 gm IV q 12 hours as ordered.  Pharmacy will sign off.    Please re-consult if further assistance is needed.  Mykel Mohl, Elisha Headland, Pharm.D.   08/03/2011 4:23 PM

## 2011-08-03 NOTE — Op Note (Signed)
NAME: SIMONE TUCKEY   MRN: [id number] DOB: [birth year]    DATE OF OPERATION: 08/03/2011  PREOP DIAGNOSIS: symptomatic right carotid stenosis  POSTOP DIAGNOSIS: same  PROCEDURE: right carotid endarterectomy with Dacron patch angioplasty  SURGEON: Di Kindle. Edilia Bo, MD, FACS  ASSIST: Doreatha Massed, PA  ANESTHESIA: Gen.   EBL: minimal  INDICATIONS: TALLY MCKINNON is a 66 y.o. male presented with the sudden onset of left-sided weakness and was found to have a small right brain stroke by MRI. Carotid duplex scan showed a critical right carotid stenosis which was confirmed by CT angiogram. Of note the stenosis was noted to be fairly high.  FINDINGS: greater than 90% right carotid stenosis which extended very high and extended over a length of approximately 4 cm.  TECHNIQUE: The patient was brought to the operating room and she did general anesthetic. Arterial line had been placed by anesthesia. After careful positioning, the right neck was prepped and draped in the usual sterile fashion. An incision was made along the anterior border of the sternocleidomastoid and the dissection carried down to the common carotid artery which was dissected free and controlled with a Rummel tourniquet. The facial vein was divided between 2-0 silk ties. The internal carotid artery was noted to have plaque that extended up fairly high up the internal carotid artery. I was able to get above this ultimately. The external carotid artery and superior thyroid arteries were controlled. The patient was then heparinized. After the heparin circulated, clamps were then placed on the internal then the external then the common carotid artery. A longitudinal arteriotomy was made in the common carotid artery and this was extended through the plaque into the internal carotid artery above the plaque. A 10 Bard shunt was placed into the internal carotid artery, backbled and then placed into the common carotid artery and secured  with a Rummel tourniquet. Flow was reestablished to the shunt. Endarterectomy plane was established proximally and the plaque was sharply divided. The version endarterectomy was performed of the extraoral carotid artery. Distally there was nice tapering the plaque and no tacking sutures were required. The artery was irrigated with copious amounts of heparin and dextran all loose debris removed. A Dacron patch was sewn using continuous 6-0 Prolene suture. Prior to completing the patch closure the shunt was removed. The artery was backbled and flushed properly and the anastomosis completed. Flow was reestablished first to the external carotid artery and then to  the internal carotid artery. At the completion there was an excellent Doppler signal distal to the patch a palpable pulse. Hemostasis was obtained and the wounds and the heparin was partially reversed with protamine. Wounds closed the deep layer of 3-0 Vicryl. The platysma was closed with running 3-0 Vicryl. The skin was closed with 4-0 subcuticular stitch. Dermabond was applied. The patient tolerated the procedure well and awoke neurologically intact. He was transferred to the recovery room in stable condition. All needle and sponge counts were correct.  Waverly Ferrari, MD, FACS [facility]  DATE OF DICTATION:   08/03/2011

## 2011-08-03 NOTE — Progress Notes (Signed)
Called Dr. Edilia Bo in surgery to report that patients BP is now in 161/54 range, but heart rate remains low in the 40's.  Dr. Edilia Bo ordered to contact the internal medicine group that is following him here in the hospital.  I am attempting to find that number.

## 2011-08-03 NOTE — H&P (View-Only) (Signed)
VASCULAR PROGRESS NOTE  SUBJECTIVE: No complaints. No new symptoms.  PHYSICAL EXAM: Filed Vitals:   08/01/11 2200 08/02/11 0200 08/02/11 0548 08/02/11 1026  BP: 136/78 135/74 151/82 132/81  Pulse: 74 75 74 69  Temp: 97.9 F (36.6 C) 97.9 F (36.6 C) 98 F (36.7 C) 97.8 F (36.6 C)  TempSrc: Oral Oral Oral Oral  Resp: 18 18 20 18   Height:      Weight:      SpO2: 93% 93% 94% 96%   Neuro exam: good strength in upper and lower extremities bilaterally.  LABS: Lab Results  Component Value Date   WBC 6.6 08/02/2011   HGB 14.7 08/02/2011   HCT 42.5 08/02/2011   MCV 89.7 08/02/2011   PLT 175 08/02/2011   Lab Results  Component Value Date   CREATININE 1.27 08/02/2011   Lab Results  Component Value Date   INR 1.00 07/30/2011   CBG (last 3)   Basename 07/30/11 1604  GLUCAP 73   ASSESSMENT/PLAN: 1. Plan to proceed with right CEA tomorrow. I have reviewed the indications for carotid endarterectomy, that is to lower the risk of future stroke. I have also reviewed the potential complications of surgery, including but not limited to: bleeding, stroke (perioperative risk 1-2%), MI, nerve injury of other unpredictable medical problems. All of the patients questions were answered and they are agreeable to proceed with surgery.   Waverly Ferrari, MD, FACS Beeper: [phone] 08/02/2011

## 2011-08-03 NOTE — Anesthesia Procedure Notes (Signed)
Procedure Name: Intubation Date/Time: 08/03/2011 7:48 AM Performed by: Julianne Rice K Pre-anesthesia Checklist: Emergency Drugs available, Suction available, Patient being monitored, Patient identified and Timeout performed Patient Re-evaluated:Patient Re-evaluated prior to inductionOxygen Delivery Method: Circle system utilized Preoxygenation: Pre-oxygenation with 100% oxygen Intubation Type: IV induction Ventilation: Mask ventilation without difficulty Laryngoscope Size: Mac and 3 Grade View: Grade II Tube type: Oral Tube size: 8.0 mm Number of attempts: 1 Airway Equipment and Method: Stylet Placement Confirmation: ETT inserted through vocal cords under direct vision,  breath sounds checked- equal and bilateral and positive ETCO2 Secured at: 24 cm Tube secured with: Tape Dental Injury: Teeth and Oropharynx as per pre-operative assessment

## 2011-08-04 LAB — CBC
MCH: 30.4 pg (ref 26.0–34.0)
MCV: 90.1 fL (ref 78.0–100.0)
Platelets: 145 10*3/uL — ABNORMAL LOW (ref 150–400)
RBC: 4.24 MIL/uL (ref 4.22–5.81)

## 2011-08-04 LAB — BASIC METABOLIC PANEL
BUN: 15 mg/dL (ref 6–23)
CO2: 24 mEq/L (ref 19–32)
Calcium: 8.5 mg/dL (ref 8.4–10.5)
Creatinine, Ser: 1.1 mg/dL (ref 0.50–1.35)
Glucose, Bld: 110 mg/dL — ABNORMAL HIGH (ref 70–99)

## 2011-08-04 MED ORDER — OXYCODONE HCL 5 MG PO TABS: 5.0000 mg | ORAL_TABLET | Freq: Four times a day (QID) | ORAL | Status: AC | PRN

## 2011-08-04 MED ORDER — ATORVASTATIN CALCIUM 40 MG PO TABS: 40.0000 mg | ORAL_TABLET | Freq: Every day | ORAL | Status: DC

## 2011-08-04 MED ORDER — ASPIRIN 325 MG PO TABS: 325.0000 mg | ORAL_TABLET | Freq: Every day | ORAL | Status: AC

## 2011-08-04 NOTE — Discharge Summary (Signed)
Agree with plans for D/C today.  Di Kindle. Edilia Bo, MD, FACS Beeper [phone] 08/04/2011 '

## 2011-08-04 NOTE — Progress Notes (Addendum)
Subjective: S/p CEA yesterday.  Patient had some bradycardia to the 40's yesterday, started on dopamine and transferred to stepdown.  Overnight, dopamine was weaned off, and HR in 80's this morning.  Objective: Vital signs in last 24 hours: Filed Vitals:   08/04/11 0000 08/04/11 0100 08/04/11 0410 08/04/11 0750  BP:   125/54   Pulse:  63    Temp: 98.2 F (36.8 C)  97.9 F (36.6 C) 98 F (36.7 C)  TempSrc: Oral  Oral   Resp:  17 18   Height:      Weight:      SpO2:  95%     Weight change:   Intake/Output Summary (Last 24 hours) at 08/04/11 0801 Last data filed at 08/04/11 0750  Gross per 24 hour  Intake   1600 ml  Output   2995 ml  Net  -1395 ml    PEX General: drowsy, cooperative, NAD HEENT: pupils equal round and reactive to light, vision grossly intact, oropharynx clear and non-erythematous. Neck: supple, right neck incision clean/dry/intact Lungs: clear to ascultation bilaterally, normal work of respiration, no wheezes, rales, ronchi Heart: regular rate and rhythm, no murmurs, gallops, or rubs Abdomen: soft, non-tender, non-distended, normal bowel sounds  Extremities: no cyanosis, clubbing, or edema Neurologic: alert & oriented X3, cranial nerves II-XII intact, strength grossly intact, sensation intact  Lab Results: Basic Metabolic Panel:  Lab 08/04/11 1610 08/03/11 0540 07/31/11 1521  NA 140 142 --  K 3.7 4.0 --  CL 107 108 --  CO2 24 28 --  GLUCOSE 110* 101* --  BUN 15 23 --  CREATININE 1.10 1.29 --  CALCIUM 8.5 8.8 --  MG -- -- 2.1  PHOS -- -- --   Liver Function Tests:  Lab 07/30/11 1531  AST 18  ALT 11  ALKPHOS 57  BILITOT 0.5  PROT 7.5  ALBUMIN 4.3   CBC:  Lab 08/04/11 0400 08/03/11 0540 07/30/11 1531  WBC 8.9 7.2 --  NEUTROABS -- -- 4.0  HGB 12.9* 14.3 --  HCT 38.2* 41.2 --  MCV 90.1 90.0 --  PLT 145* 161 --   Cardiac Enzymes:  Lab 07/30/11 1531  CKTOTAL 129  CKMB 2.9  CKMBINDEX --  TROPONINI <0.30   CBG:  Lab 07/30/11 1604   GLUCAP 73   Hemoglobin A1C:  Lab 07/30/11 1531  HGBA1C 5.6   Fasting Lipid Panel:  Lab 07/30/11 1531  CHOL 219*  HDL 64  LDLCALC 141*  TRIG 71  CHOLHDL 3.4  LDLDIRECT --   Coagulation:  Lab 07/30/11 1531  LABPROT 13.4  INR 1.00   Urine Drug Screen: Drugs of Abuse     Component Value Date/Time   LABOPIA NONE DETECTED 07/30/2011 1751   COCAINSCRNUR NONE DETECTED 07/30/2011 1751   LABBENZ NONE DETECTED 07/30/2011 1751   AMPHETMU NONE DETECTED 07/30/2011 1751   THCU NONE DETECTED 07/30/2011 1751   LABBARB NONE DETECTED 07/30/2011 1751     Studies/Results: No results found. Medications: I have reviewed the patient's current medications. Scheduled Meds:    . acetaminophen      . aspirin  325 mg Oral Daily  . atorvastatin  40 mg Oral q1800  . cefUROXime (ZINACEF)  IV  1.5 g Intravenous Q12H  . docusate sodium  100 mg Oral Daily  . DOPamine      . enoxaparin  40 mg Subcutaneous Q24H  . HYDROmorphone      . morphine      . pantoprazole  40 mg  Oral Q1200  . DISCONTD: enoxaparin  40 mg Subcutaneous Q24H   Continuous Infusions:    . sodium chloride 100 mL/hr at 08/04/11 0200  . lactated ringers    . DISCONTD: DOPamine 5 mcg/kg/min (08/03/11 1149)  . DISCONTD: DOPamine    . DISCONTD: DOPamine 1.5 mcg/kg/min (08/03/11 2100)   PRN Meds:. Assessment/Plan: The patient is a 66 yo man, presenting with an acute stroke, with R-sided carotid stenosis seen on duplex.   # Acute Stroke - MRI shows a 4mm acute R parietal cortex infarct. Lipid panel reveals hyperlipidemia, but patient has no prior history of HTN (though BP somewhat elevated since admission), DM (A1C = 5.6), FH of CVA/MI, or smoking. Stroke likely from R carotid stenosis.  -appreciate neuro assistance  -aspirin 325  -Echo shows no wall motion abnormalities, EF 55-60% -continue telemetry   -risk factor modification (see below)   # R Carotid Stenosis - Carotid duplex and CTA show >80% R carotid stenosis, which  is likely the etiology for the patient's acute stroke. 40% L carotid stenosis as well.  Patient is POD #1 s/p R CEA. -vascular surgery consulting, greatly appreciate management -plan per vascular surgery  # Bradycardia - Resolved, post-op bradycardia, likely represented lingering effects of anesthesia vs baroreceptor trauma during neck surgery.   -dopamine drip discontinued overnight  # Elevated Blood Pressure - the patient's BP has been somewhat elevated since admission.  However, in light of carotid stenosis, will wait on lowering BP until after CEA. -no intervention at this time, consider starting BP medication at outpatient follow-up  # Hyperlipidemia - found on admission, LDL 141  -continue atorvastatin  # Prophy - lovenox  # Dispo - per vascular surgery team   LOS: 5 days   Janalyn Harder 08/04/2011, 8:01 AM  Addendum 8:25 am:  Patient discharged per vascular surgery team.  Since patient is stable from their standpoint, we have no objection to discharge today.  We will make the patient an appointment to follow-up in our outpatient clinic, as the patient has no PCP.

## 2011-08-04 NOTE — Discharge Summary (Signed)
Internal Medicine Teaching [facility] Discharge Note  Name: BENTLEY FISSEL MRN: [id number] DOB: 1945/08/16 66 y.o.  Date of Admission: 07/30/2011  2:35 PM Date of Discharge: 08/04/2011 Attending Physician: Farley Ly, MD  Discharge Diagnosis: 1. Acute Ischemic stroke - 4 mm R parietal cortex infarct 2. Carotid stenosis - R carotid stenosis 80%, underwent R CEA 6/25 3. Hyperlipidemia - discovered on admission, started statin 4. Elevated Blood Pressure - borderline HTN during admission  Discharge Medications: Medication List  As of 08/08/2011  9:08 AM   TAKE these medications         aspirin 325 MG tablet   Take 1 tablet (325 mg total) by mouth daily.      atorvastatin 40 MG tablet   Commonly known as: LIPITOR   Take 1 tablet (40 mg total) by mouth daily at 6 PM.      oxyCODONE 5 MG immediate release tablet   Commonly known as: Oxy IR/ROXICODONE   Take 1 tablet (5 mg total) by mouth every 6 (six) hours as needed for pain.            Disposition and follow-up:   Mr.Torion F Andal was discharged from [facility] in stable and improved condition.  The patient will follow-up with Dr. Pearlean Brownie for stroke follow-up, and with Dr. Edilia Bo for CEA follow-up.  The patient has no PCP, but has made an appointment to establish with a private MD for September.  Until then, the patient will present to the [facility] for a 1-time follow-up on 7/1 with Dr. Shirlee Latch, to address the following issues: 1. Order CMP to check liver enzymes after starting statin 2. Check BP, and consider adding antihypertensive if elevated (ie beta blocker) 3. Ensure patient has had no new focal neuro findings since discharge  Follow-up Appointments: Discharge Orders    Future Appointments: Provider: Department: Dept Phone: Center:   08/25/2011 1:00 PM Chuck Hint, MD [facility] [phone] [facility]     Future Orders Please Complete By Expires   Resume  previous diet      Driving Restrictions      Comments:   No driving for 2 weeks   Lifting restrictions      Comments:   No lifting for 6 weeks   Call MD for:  temperature >100.5      Call MD for:  redness, tenderness, or signs of infection (pain, swelling, bleeding, redness, odor or green/yellow discharge around incision site)      Call MD for:  severe or increased pain, loss or decreased feeling  in affected limb(s)      Discharge wound care:      Comments:   Shower daily with soap and water starting 08/05/11   CAROTID Sugery: Call MD for difficulty swallowing or speaking; weakness in arms or legs that is a new symtom; severe headache.  If you have increased swelling in the neck and/or  are having difficulty breathing, CALL 911        Follow-up Information    Follow up with Gates Rigg, MD in 2 months. (pt should call to arrange.)    Contact information:   [address] [facility] [place] [address] [phone]       Follow up with DICKSON,CHRISTOPHER S, MD in 3 weeks. (Office will call you to arrange your appt (sent))    Contact information:   [address] [place] [address] [phone]  Follow up with Annett Gula, MD on 08/09/2011. (2:00 pm)    Contact information:   [address] [place] [address] [phone]          Consultations: Neurology Pearlean Brownie), Vascular Surgery Edilia Bo)  Procedures Performed:  Ct Head Wo Contrast  07/31/2011  *RADIOLOGY REPORT*  Clinical Data: Stroke.  Left facial numbness  CT HEAD WITHOUT CONTRAST  Technique:  Contiguous axial images were obtained from the base of the skull through the vertex without contrast.  Comparison: MRI head 07/30/2011  Findings: Ventricle size is normal.  Negative for intracranial hemorrhage.  MRI revealed a small area of acute infarction in the right parietal lobe, this is not seen on CT.  No infarct or mass  is identified.  Calvarium intact.  IMPRESSION: No significant abnormality.  Small acute infarct right parietal lobe on MRI is not visualized on CT.  Original Report Authenticated By: [name], M.D.   Ct Head Wo Contrast  07/30/2011  *RADIOLOGY REPORT*  Clinical Data: 66 year old male with left arm weakness.  CT HEAD WITHOUT CONTRAST  Technique:  Contiguous axial images were obtained from the base of the skull through the vertex without contrast.  Comparison: None  Findings: No acute intracranial abnormalities are identified, including mass lesion or mass effect, hydrocephalus, extra-axial fluid collection, midline shift, hemorrhage, or acute infarction.  The visualized bony calvarium is unremarkable.  IMPRESSION: No evidence of acute intracranial abnormality.  Original Report Authenticated By: Rosendo Gros, M.D.   Ct Angio Neck W/cm &/or Wo/cm  08/01/2011  *RADIOLOGY REPORT*  Clinical Data:  Left facial numbness.  CVA  CT ANGIOGRAPHY NECK  Technique:  Multidetector CT imaging of the neck was performed using the standard protocol during bolus administration of intravenous contrast.  Multiplanar CT image reconstructions including MIPs were obtained to evaluate the vascular anatomy. Carotid stenosis measurements (when applicable) are obtained utilizing NASCET criteria, using the distal internal carotid diameter as the denominator.  Contrast: 80mL OMNIPAQUE IOHEXOL 350 MG/ML SOLN  Comparison:  MRI 07/30/2011  Findings:  Negative for mass or adenopathy.  Ground-glass density the lungs bilaterally may represent edema or possibly infection.  Standard branching pattern of the aortic arch.  Proximal great vessels are patent with without significant stenosis.  Right carotid:  Right common carotid artery is patent.  Mixed density calcified and noncalcified plaque in the proximal right internal carotid artery. 50% diameter stenosis of the proximal internal carotid artery.  Approximately 15 mm above this, there is a  more focal area of stenosis with a focal area of severe narrowing measuring 1.1 mm in diameter,  this corresponds to 80% diameter stenosis.  Right internal carotid artery is patent to the skull base.  Left carotid:  The left common carotid artery is widely patent. Calcified and soft plaque involving the left carotid bulb narrowing the lumen by approximately 40% diameter stenosis.  Left internal carotid artery is patent to the skull base without additional stenosis.  Mild stenosis at the origin of the external carotid artery.  Vertebral artery:  Calcified plaque at the origin of the left vertebral artery with a 50% diameter stenosis.  Left vertebral artery is patent without additional stenosis.  40% diameter stenosis at the origin of the right vertebral artery.  Right vertebral artery is patent without additional stenosis.   Review of the MIP images confirms the above findings.  IMPRESSION: High-grade stenosis proximal right internal carotid artery measuring up to 80% diameter stenosis.  40% diameter  stenosis proximal left internal carotid artery.  Approximately 40% diameter stenosis of the proximal right vertebral artery and a 50% diameter stenosis of the proximal left vertebral artery.  Original Report Authenticated By: [name], M.D.   Mr Brain Wo Contrast  07/30/2011    *RADIOLOGY REPORT*  Clinical Data:  Left arm numbness.  Rule out CVA.  MRI HEAD WITHOUT CONTRAST MRA HEAD WITHOUT CONTRAST  Technique:  Multiplanar, multiecho pulse sequences of the brain and surrounding structures were obtained without intravenous contrast. Angiographic images of the head were obtained using MRA technique without contrast.  Comparison:  CT 07/30/2011  MRI HEAD  Findings:  4 mm focus of restricted diffusion in the right parietal cortex compatible with acute infarct.  No other acute infarct.  Scattered tiny white matter hyperintensities bilaterally compatible with chronic microvascular ischemia.  No cortical infarct.  Brainstem and cerebellum are normal.  Negative for hemorrhage or mass lesion.  Chronic sinusitis.  Mucous retention cyst left maxillary sinus.  IMPRESSION: 4 mm acute infarct right parietal cortex.  Mild chronic microvascular ischemia.  MRA HEAD  Findings: Both vertebral arteries are patent to the basilar.  PICA is patent bilaterally.  Superior cerebellar and posterior cerebral arteries are patent.  Fetal origin of the right posterior cerebral artery.  Internal carotid artery is widely patent bilaterally without stenosis.  Anterior and middle cerebral arteries are patent bilaterally.  Hypoplastic right A1 segment.  Negative for cerebral aneurysm.  IMPRESSION: Negative  Original Report Authenticated By: [name], M.D.   Mr Mra Head/brain Wo Cm  07/30/2011    *RADIOLOGY REPORT*  Clinical Data:  Left arm numbness.  Rule out CVA.  MRI HEAD WITHOUT CONTRAST MRA HEAD WITHOUT CONTRAST  Technique:  Multiplanar, multiecho pulse sequences of the brain and surrounding structures were obtained without intravenous contrast. Angiographic images of the head were obtained using MRA technique without contrast.  Comparison:  CT 07/30/2011  MRI HEAD  Findings:  4 mm focus of restricted diffusion in the right parietal cortex compatible with acute infarct.  No other acute infarct.  Scattered tiny white matter hyperintensities bilaterally compatible with chronic microvascular ischemia.  No cortical infarct. Brainstem and cerebellum are normal.  Negative for hemorrhage or mass lesion.  Chronic sinusitis.  Mucous retention cyst left maxillary sinus.  IMPRESSION: 4 mm acute infarct right parietal cortex.  Mild chronic microvascular ischemia.  MRA HEAD  Findings: Both vertebral arteries are patent to the basilar.  PICA is patent bilaterally.  Superior cerebellar and posterior cerebral arteries are patent.  Fetal origin of the right posterior cerebral artery.  Internal carotid artery is widely patent bilaterally without stenosis.   Anterior and middle cerebral arteries are patent bilaterally.  Hypoplastic right A1 segment.  Negative for cerebral aneurysm.  IMPRESSION: Negative  Original Report Authenticated By: [name], M.D.    2D Echo: 08/01/11 - Left ventricle: The cavity size was normal. Systolic function was normal. The estimated ejection fraction was in the range of 55% to 60%. Wall motion was normal; there were no regional wall motion abnormalities. - Atrial septum: No defect or patent foramen ovale was identified.  Admission HPI:  This is a 66 year old male with PMH significant for Hyperlipidemia who presented to the ED [place] with tingling on his left face and left arm. Three days prior to admission, the patient experienced left-sided face, arm, and hand tingling, constant and unchanged since that time. He notes no other tingling in his body, no weakness,  and no pain. No blurry vision, hearing changes, facial droop, slurred speech. He notes no history of prior stroke. MRI was performed, which showed an acute right parietal cortex infarct. Carotid dopplers revealed a >80% R carotid artery stenosis. The patient notes no chest pain, SOB, nausea/vomiting, abd pain, diarrhea/constipation, melena, or dysuria.  The patient also notes a several month history of calf cramping, described as a "cramping" sensation in the back of both legs, typically occurring when lying down at night, with no alleviating factors. He notes no leg cramping when walking up stairs or performing other physical activity. No history of CAD or PVD.  Admission Physical Exam Blood pressure 152/61, pulse 68, temperature 97.9 F (36.6 C), temperature source Oral, resp. rate 20, SpO2 99.00%.  General: alert, cooperative, and in no apparent distress HEENT: pupils equal round and reactive to light, vision grossly intact, oropharynx clear and non-erythematous  Neck: supple, no lymphadenopathy, right carotid bruit appreciated Lungs: clear to  ascultation bilaterally, normal work of respiration, no wheezes, rales, ronchi Heart: regular rate and rhythm, no murmurs, gallops, or rubs Abdomen: soft, non-tender, non-distended, normal bowel sounds  Extremities: no cyanosis, clubbing, or edema Neurologic: alert & oriented X3, cranial nerves II-XII intact, strength 5/5 throughout, sensation intact to light touch throughout, reflexes 2+ throughout  Admission Labs Basic Metabolic Panel:  [facility]  07/30/11 1531   NA  140   K  4.0   CL  104   CO2  25   GLUCOSE  87   BUN  22   CREATININE  1.10   CALCIUM  9.6   MG  --   PHOS  --    Liver Function Tests:  Basename  07/30/11 1531   AST  18   ALT  11   ALKPHOS  57   BILITOT  0.5   PROT  7.5   ALBUMIN  4.3    CBC:  Basename  07/30/11 1531   WBC  6.8   NEUTROABS  4.0   HGB  15.7   HCT  44.4   MCV  87.9   PLT  203     Hospital Course by problem list: 1. Acute Ischemic stroke - The patient presented with right-sided face and upper extremity numbness/tingling, with MRI evidence of a 4 mm acute R parietal cortex infarct.  Carotid dopplers, and later CTA Neck, showed an 80% right carotid stenosis as the likely source of the patient's stroke.  The patient's symptoms improved over the course of 2 days, but then recurred on 6/22, raising concern for hemorrhagic conversion of ischemic infarct, though follow-up head CT at that time was negative for a bleed, and symptoms resolved within a matter of hours.  The patient was found to have hyperlipidemia on admission, and also had somewhat elevated blood pressures during hospitalization, but had no personal history of stroke or FH of stroke.  The patient was started on a statin for risk factor modification.  2. Carotid stenosis - The patient was found to have right carotid artery stenosis of 80% on carotid dopplers and on CTA Neck, as well as 40% stenosis of the left carotid artery and bilateral vertebral arteries.  The patient underwent right  carotid endarterectomy on 6/25, with some post-op bradycardia requiring a dopamine drip for several hours.  Otherwise the patient experienced no complications from the procedure, and recovered well.  The patient will follow-up in the outpatient setting.  3. Hyperlipidemia - The patient was found to have an LDL of 141  on admission, with no prior known history of hyperlipidemia.  The patient was started on atorvastatin 40, which was continued at discharge.  4. Elevated Blood Pressure - the patient was noted to have elevated blood pressures during admission.  However, it was unclear whether this represented true hypertension or a response to pain/stress given acute presentation.  Also, we were hesitant to lower his BP in the patient setting given his acute ischemic stroke and significant carotid stenosis.  The patient was not started on an antihypertensive during admission, but will follow-up in the outpatient clinic, and if found to be hypertensive will be started on appropriate medical treatment.  Time spent on discharge: 45 minutes  Discharge Vitals:  BP 140/53  Pulse 70  Temp 98 F (36.7 C) (Oral)  Resp 16  Ht 6\' 2"  (1.88 m)  Wt 200 lb (90.719 kg)  BMI 25.68 kg/m2  SpO2 95%  Discharge Labs:  Results for orders placed during the hospital encounter of 07/30/11 (from the past 24 hour(s))  CBC     Status: Abnormal   Collection Time   08/04/11  4:00 AM      Component Value Range   WBC 8.9  4.0 - 10.5 K/uL   RBC 4.24  4.22 - 5.81 MIL/uL   Hemoglobin 12.9 (*) 13.0 - 17.0 g/dL   HCT 16.1 (*) 09.6 - 04.5 %   MCV 90.1  78.0 - 100.0 fL   MCH 30.4  26.0 - 34.0 pg   MCHC 33.8  30.0 - 36.0 g/dL   RDW 40.9  81.1 - 91.4 %   Platelets 145 (*) 150 - 400 K/uL  BASIC METABOLIC PANEL     Status: Abnormal   Collection Time   08/04/11  4:00 AM      Component Value Range   Sodium 140  135 - 145 mEq/L   Potassium 3.7  3.5 - 5.1 mEq/L   Chloride 107  96 - 112 mEq/L   CO2 24  19 - 32 mEq/L   Glucose,  Bld 110 (*) 70 - 99 mg/dL   BUN 15  6 - 23 mg/dL   Creatinine, Ser 7.82  0.50 - 1.35 mg/dL   Calcium 8.5  8.4 - 95.6 mg/dL   GFR calc non Af Amer 68 (*) >90 mL/min   GFR calc Af Amer 78 (*) >90 mL/min    Signed: Janalyn Harder 08/04/2011, 8:25 AM

## 2011-08-04 NOTE — Progress Notes (Signed)
[facility] Progress Note  08/04/2011 7:41 AM POD 1  Subjective:  "feeling better by the hour"  Nausea improved.  Afebrile x 24 hours    VSS    95%RA Filed Vitals:   08/04/11 0410  BP: 125/54  Pulse:   Temp: 97.9 F (36.6 C)  Resp: 18     Physical Exam: Neuro:  In tact without deficit. Incision:  C/d/i.  CBC    Component Value Date/Time   WBC 8.9 08/04/2011 0400   RBC 4.24 08/04/2011 0400   HGB 12.9* 08/04/2011 0400   HCT 38.2* 08/04/2011 0400   PLT 145* 08/04/2011 0400   MCV 90.1 08/04/2011 0400   MCH 30.4 08/04/2011 0400   MCHC 33.8 08/04/2011 0400   RDW 13.0 08/04/2011 0400   LYMPHSABS 2.1 07/30/2011 1531   MONOABS 0.5 07/30/2011 1531   EOSABS 0.1 07/30/2011 1531   BASOSABS 0.0 07/30/2011 1531    BMET    Component Value Date/Time   NA 140 08/04/2011 0400   K 3.7 08/04/2011 0400   CL 107 08/04/2011 0400   CO2 24 08/04/2011 0400   GLUCOSE 110* 08/04/2011 0400   BUN 15 08/04/2011 0400   CREATININE 1.10 08/04/2011 0400   CALCIUM 8.5 08/04/2011 0400   GFRNONAA 68* 08/04/2011 0400   GFRAA 78* 08/04/2011 0400     Intake/Output Summary (Last 24 hours) at 08/04/11 0741 Last data filed at 08/04/11 0404  Gross per 24 hour  Intake   1600 ml  Output   2995 ml  Net  -1395 ml      Assessment/Plan:  This is a 66 y.o. male who is s/p right CEA POD 1  -doing well this am.  Neuro is in tact. -his nausea is improved. -d/c home -f/u with Dr. Edilia Bo in 2 weeks.   Doreatha Massed, PA-C [facility] [phone]

## 2011-08-04 NOTE — Discharge Instructions (Signed)
STROKE/TIA DISCHARGE INSTRUCTIONS SMOKING Cigarette smoking nearly doubles your risk of having a stroke & is the single most alterable risk factor  If you smoke or have smoked in the last 12 months, you are advised to quit smoking for your health.  Most of the excess cardiovascular risk related to smoking disappears within a year of stopping.  Ask you doctor about anti-smoking medications  [place] Quit Line: 1-800-QUIT NOW  Free Smoking Cessation Classes [phone]  CHOLESTEROL Know your levels; limit fat & cholesterol in your diet  Lipid Panel     Component Value Date/Time   CHOL 219* 07/30/2011 1531   TRIG 71 07/30/2011 1531   HDL 64 07/30/2011 1531   CHOLHDL 3.4 07/30/2011 1531   VLDL 14 07/30/2011 1531   LDLCALC 141* 07/30/2011 1531      Many patients benefit from treatment even if their cholesterol is at goal.  Goal: Total Cholesterol (CHOL) less than 160  Goal:  Triglycerides (TRIG) less than 150  Goal:  HDL greater than 40  Goal:  LDL (LDLCALC) less than 100   BLOOD PRESSURE American Stroke Association blood pressure target is less that 120/80 mm/Hg  Your discharge blood pressure is:  BP: 151/82 mmHg  Monitor your blood pressure  Limit your salt and alcohol intake  Many individuals will require more than one medication for high blood pressure  DIABETES (A1c is a blood sugar average for last 3 months) Goal HGBA1c is under 7% (HBGA1c is blood sugar average for last 3 months)  Diabetes: {STROKE DC DIABETES:22357}    Lab Results  Component Value Date   HGBA1C 5.6 07/30/2011     Your HGBA1c can be lowered with medications, healthy diet, and exercise.  Check your blood sugar as directed by your physician  Call your physician if you experience unexplained or low blood sugars.  PHYSICAL ACTIVITY/REHABILITATION Goal is 30 minutes at least 4 days per week    {STROKE DC ACTIVITY/REHAB:22359}  Activity decreases your risk of heart attack and stroke and makes your heart  stronger.  It helps control your weight and blood pressure; helps you relax and can improve your mood.  Participate in a regular exercise program.  Talk with your doctor about the best form of exercise for you (dancing, walking, swimming, cycling).  DIET/WEIGHT Goal is to maintain a healthy weight  Your discharge diet is: Cardiac *** liquids Your height is:  Height: 6\' 2"  (188 cm) Your current weight is: Weight: 90.719 kg (200 lb) Your Body Mass Index (BMI) is:  BMI (Calculated): 25.7   Following the type of diet specifically designed for you will help prevent another stroke.  Your goal weight range is:  ***  Your goal Body Mass Index (BMI) is 19-24.  Healthy food habits can help reduce 3 risk factors for stroke:  High cholesterol, hypertension, and excess weight.  RESOURCES Stroke/Support Group:  Call [phone]  they meet the 3rd Sunday of the month on the Rehab Unit at [facility], [place] ( no meetings June, July & Aug).  STROKE EDUCATION PROVIDED/REVIEWED AND GIVEN TO PATIENT Stroke warning signs and symptoms How to activate emergency medical system (call 911). Medications prescribed at discharge. Need for follow-up after discharge. Personal risk factors for stroke. Pneumonia vaccine given:   {STROKE DC YES/NO/DATE:22363} Flu vaccine given:   {STROKE DC YES/NO/DATE:22363} My questions have been answered, the writing is legible, and I understand these instructions.  I will adhere to these goals & educational materials that have been provided  to me after my discharge from the hospital.    RESOURCE GUIDE  Chronic Pain Problems: Contact [facility]  [phone] Patients need to be referred by their primary care doctor.  Insufficient Money for Medicine: Contact United Way:  call "211" or [facility] 6306180394.  No Primary Care Doctor: - Call [facility]  [phone] - can help you locate a primary care doctor that  accepts your insurance, provides certain  services, etc. - Physician Referral Service- [phone]  Agencies that provide inexpensive medical care: - [facility]  [phone] - [facility]  [phone] - [facility]  [phone] - Women's Clinic  [phone] - Planned Parenthood  [phone] [facility]  [phone]  Medicaid-accepting [facility] Providers: - [facility]- [address]  [phone], Mon-Fri 9am-7pm, Sat 9am-1pm - [facility]- [address] [place], [address] [place]  [phone] - [facility]- [address], [address] [place]  [phone] [facility]- [address]  [phone] - Renaye Rakers- [address] [place], [address], [phone]  Only accepts [place] patients after they have their name  applied to their card  Self Pay (no insurance) in [place]: - Sickle Cell Patients: Dr Willey Blade, [facility]  [address] [place], [phone] - [facility]- [address] [place]  [phone]       [facility]- [address]       -     [facility]- see information above (Speak to [employer] if you do not have insurance)       -  [facility]- [address] [place], [phone]       -  [facility]- [address],  [phone]       -  [facility]- [address] [phone]       -  Dr Julio Sicks-  [address], [place], [phone]       -  [facility]- [address], [phone]       -  [facility]- [address], [phone], also [address] [phone]       -    [facility]- [address] [place], [phone], 1st & 3rd Saturday   every month, 10am-1pm  1) Find a Doctor and Pay Out of Pocket Although you won't have to find out who is covered by your insurance plan, it is a good idea to ask around and get  recommendations. You will then need to call the office and see if the doctor you have chosen will accept you as a new patient and what types of options they offer for patients who are self-pay. Some doctors offer discounts or will set up payment plans for their patients who do not have insurance, but you will need to ask so you aren't surprised when you get to your appointment.  2) Contact Your Local Health Department Not all health departments have doctors that can see patients for sick visits, but many do, so it is worth a call to see if yours does. If you don't know where your local health department is, you can check in your phone book. The CDC also has a tool to help  you locate your state's health department, and many state websites also have listings of all of their local health departments.  3) Find a Walk-in Clinic If your illness is not likely to be very severe or complicated, you may want to try a walk in clinic. These are popping up all over the country in pharmacies, drugstores, and shopping centers. They're usually staffed by nurse practitioners or physician assistants that have been trained to treat common illnesses and complaints. They're usually fairly quick and inexpensive. However, if you have serious medical issues or chronic medical problems, these are probably not your best option  STD Testing - [facility] Department of [facility] [place], STD Clinic, [address], [place], phone [phone] or [phone].  Monday - Friday, call for an appointment. [facility] Department of [employer], STD Clinic, [address], [place], phone [phone] or [phone].  Monday - Friday, call for an appointment.  Abuse/Neglect: [facility] Child Abuse Hotline (475) 733-0256 [facility] Child Abuse Hotline (430)722-1574 (After Hours)  Emergency Shelter:  [facility] 201-037-6605  Maternity Homes: - Room at the [place] [facility] the  [facility] [phone] - [facility] ([phone]  MRSA Hotline #:   2230248721  [facility] Resources  [facility] [place]  United Way [facility] Dept. [address]                 [address]         [address] [place] [address]  Blondell Reveal Phone:  [phone]                                  Phone:  [phone]                   Phone:  [phone]  [facility], [phone] - [facility] - [phone]       -     [facility] in [place], [address],                                  [phone], [facility] Child Abuse Hotline [phone] or 928-570-2342 (After Hours)   Behavioral Health Services  Substance Abuse Resources: - [facility] and Drug Services  [phone] - [facility] [phone] - [facility] [phone] Floydene Flock [phone] - Residential & Outpatient Substance Abuse Program  ([phone]  Psychological Services: [facility]  [phone] Services  [phone] - [facility], [phone] [place]. [address], [place], ACCESS LINE: [phone] or [phone], [email or web address]  Dental Assistance  If unable to pay or uninsured, contact:  [facility] or [facility]. to become qualified for the adult dental clinic.  Patients with Medicaid: [facility] [phone] [address]  Joellyn Qu[address], [phone] [address], [phone]  If unable to pay, or uninsured, contact [facility] [phone]) or [facility] Department [phone] in [place], [phone] in [facility]) to become qualified for the adult dental clinic  Other Low-Cost Community Dental Services: - [facility]- [address] [place],  [place], [place], [address], [phone], [phone], 2nd and 4th Thursday of the month at 6:30am.  10 clients each day by appointment, can sometimes see walk-in patients if someone does not show for an appointment. [facility]- [address] Ether Griffins [place], [place], [address], [phone] - [facility]- [address], [place], [place], [address], [phone] - [facility]- [phone] [facility] Department- [phone] [facility] Department- [phone]

## 2011-08-04 NOTE — Progress Notes (Addendum)
Pt ambulating and voiding independently and eating without nausea.  Discharge instructions (including cardiac diet education) given to pt.  All questions answered.  Pt discharged home with spouse.  Roselie Awkward, RN

## 2011-08-04 NOTE — Progress Notes (Signed)
Stroke Team Progress Note    Admission History: Eduardo Shaw is an 66 y.o. male who reports that on Tuesday of this week he had an episode of numbness and tingling along the left side of the face and the left arm. Symptoms resolved. Symptoms returned on Wednesday and Thursday and became associated with the inability to perform fine motor movements, such as buttoning his shirt with his left hand as well. Even the repeated symptoms lasted no more than 20 minutes at a time. They all resolved spontaneously. Patient presented for evaluation at that time. Was sent to [facility] for TIA work up. In work up MRI of the brain was performed and shows a 4mm acute right parietal cortex infarct. MRA of the brain was unremarkable. Preliminary carotid doppler report shows greater than 80% stenosis of the RICA. LICA is patent.  LSN: 07/27/2011  tPA Given: No: Resolution of symptoms  Subjective: Feeling well without acute complaints, no difficulty with swallowing or difficulty with breathing.Wants to go home.  Interval Events: - Patient underwent right carotid endarterectomy 08/02/11- Bradycardic yesterday, requiring DA drip, has been since weaned.  Objective: BP 125/54  Pulse 63  Temp 97.9 F (36.6 C) (Oral)  Resp 18  Ht 6\' 2"  (1.88 m)  Wt 200 lb (90.719 kg)  BMI 25.68 kg/m2  SpO2 95%  CBGsNo results found for this basename: GLUCAP:10 in the last 72 hours Diet: regular, thin  Activity: up ad lib  DVT Prophylaxis: lovenox  Medications: Scheduled:    . acetaminophen      . aspirin  325 mg Oral Daily  . atorvastatin  40 mg Oral q1800  . cefUROXime (ZINACEF)  IV  1.5 g Intravenous Q12H  . docusate sodium  100 mg Oral Daily  . DOPamine      . enoxaparin  40 mg Subcutaneous Q24H  . HYDROmorphone      . morphine      . pantoprazole  40 mg Oral Q1200  . DISCONTD: enoxaparin  40 mg Subcutaneous Q24H   Neurologic Exam: Mental Status: Alert, oriented, thought content appropriate.  Speech fluent  without evidence of aphasia. Able to follow 3 step commands without difficulty. Cranial Nerves: II- Visual fields grossly intact. Visual acuity intact. III/IV/VI-Extraocular movements intact.  Pupils reactive bilaterally. V/VII-Smile symmetric VIII-hearing grossly intact XI-bilateral shoulder shrug XII-midline tongue extension Motor: 5/5 bilaterally with normal tone and bulk Sensory: Light touch intact throughout, bilaterally Deep Tendon Reflexes: 2+ and symmetric throughout Plantars: Downgoing bilaterally Cerebellar: Normal finger-to-nose, normal rapid alternating movements and normal heel-to-shin test.   Lab Results: Basic Metabolic Panel:  Lab 08/04/11 1610 08/03/11 0540 07/31/11 1521  NA 140 142 --  K 3.7 4.0 --  CL 107 108 --  CO2 24 28 --  GLUCOSE 110* 101* --  BUN 15 23 --  CREATININE 1.10 1.29 --  CALCIUM 8.5 8.8 --  MG -- -- 2.1  PHOS -- -- --   Liver Function Tests:  Lab 07/30/11 1531  AST 18  ALT 11  ALKPHOS 57  BILITOT 0.5  PROT 7.5  ALBUMIN 4.3   CBC:  Lab 08/04/11 0400 08/03/11 0540 07/30/11 1531  WBC 8.9 7.2 --  NEUTROABS -- -- 4.0  HGB 12.9* 14.3 --  HCT 38.2* 41.2 --  MCV 90.1 90.0 --  PLT 145* 161 --   Cardiac Enzymes:  Lab 07/30/11 1531  CKTOTAL 129  CKMB 2.9  CKMBINDEX --  TROPONINI <0.30   CBG:  Lab 07/30/11 1604  GLUCAP 73  Hemoglobin A1C:  Lab 07/30/11 1531  HGBA1C 5.6   Fasting Lipid Panel:  Lab 07/30/11 1531  CHOL 219*  HDL 64  LDLCALC 141*  TRIG 71  CHOLHDL 3.4  LDLDIRECT --   Coagulation:  Lab 07/30/11 1531  LABPROT 13.4  INR 1.00   Urine Drug Screen: Drugs of Abuse     Component Value Date/Time   LABOPIA NONE DETECTED 07/30/2011 1751   COCAINSCRNUR NONE DETECTED 07/30/2011 1751   LABBENZ NONE DETECTED 07/30/2011 1751   AMPHETMU NONE DETECTED 07/30/2011 1751   THCU NONE DETECTED 07/30/2011 1751   LABBARB NONE DETECTED 07/30/2011 1751    Misc. Labs  Study Results: 07/31/2011   CT HEAD WITHOUT CONTRAST    Findings: Ventricle size is normal.  Negative for intracranial hemorrhage.  MRI revealed a small area of acute infarction in the right parietal lobe, this is not seen on CT.  No infarct or mass is identified.  Calvarium intact.  IMPRESSION: No significant abnormality.  Small acute infarct right parietal lobe on MRI is not visualized on CT.   [name], M.D.   07/30/2011 CT HEAD WITHOUT CONTRAST   Findings: No acute intracranial abnormalities are identified, including mass lesion or mass effect, hydrocephalus, extra-axial fluid collection, midline shift, hemorrhage, or acute infarction.  The visualized bony calvarium is unremarkable.  IMPRESSION: No evidence of acute intracranial abnormality.   [name], M.D.   07/30/2011 MRI HEAD  Findings:  4 mm focus of restricted diffusion in the right parietal cortex compatible with acute infarct.  No other acute infarct.  Scattered tiny white matter hyperintensities bilaterally compatible with chronic microvascular ischemia.  No cortical infarct. Brainstem and cerebellum are normal.  Negative for hemorrhage or mass lesion.  Chronic sinusitis.  Mucous retention cyst left maxillary sinus.  IMPRESSION: 4 mm acute infarct right parietal cortex.  Mild chronic microvascular ischemia. [name], M.D   07/30/2011 MRA HEAD  Findings: Both vertebral arteries are patent to the basilar.  PICA is patent bilaterally.  Superior cerebellar and posterior cerebral arteries are patent.  Fetal origin of the right posterior cerebral artery.  Internal carotid artery is widely patent bilaterally without stenosis.  Anterior and middle cerebral arteries are patent bilaterally.  Hypoplastic right A1 segment.  Negative for cerebral aneurysm.  IMPRESSION: Negative  [name], M.D.   2D echo EF 55-60% with no source of embolus.   Therapies: pending  Assessment/Plan: 67 y.o. male without known stroke risk factors who presents with a small right parietal infarct. Exam  nonfocal. Carotid US revealed a greater than 80% right ICA stenosis. Based on acute infarct this stenosis would be considered symptomatic. [name] was consulted. Right carotid endarterectomy performed on 08/03/2011. Was not on antiplatelet therapy prior to admission. Now on ASA 325 mg daily.  - Symptomatic right ICA stenosis s/p CEA  - Hyperlipidemia, LDL 141, on lipitor (newly started on admission)   Plan:  -continue  Aspirin - dose 325mg  daily and Lipitor -ongoing risk factor modification -Consider ACCELERATE trial, use of Cholesteryl Ester Transfer Protein (CETP) Inhibitor: Potential of Evacetrapib to treat atherosclerosis and CAD. [facility] will contact patient with information about trial, screen for inclusion.  -Recommend follow-up with Dr. Pearlean Brownie as an outpatient, 2 months after discharge, patient should call for an appointment.   LOS: 5 days   Signed: Johnette Abraham, Erline Levine, Internal Medicine Resident Pager: [phone] (7AM-5PM) 08/04/2011, 7:39 AM     Dr. Delia Heady, Stroke Center Medical  Director, has personally reviewed chart, pertinent data, examined the patient and developed the plan of care.   Delia Heady, MD Medical Director [facility] Stroke Center Pager: [phone] 08/04/2011 7:39 AM

## 2011-08-05 ENCOUNTER — Encounter (HOSPITAL_COMMUNITY): Payer: Self-pay | Admitting: Vascular Surgery

## 2011-08-09 ENCOUNTER — Encounter: Payer: Medicare Other | Admitting: Internal Medicine

## 2011-08-24 ENCOUNTER — Encounter: Payer: Self-pay | Admitting: Vascular Surgery

## 2011-08-25 ENCOUNTER — Encounter: Payer: Self-pay | Admitting: Vascular Surgery

## 2011-08-25 ENCOUNTER — Ambulatory Visit: Payer: Medicare Other | Admitting: Vascular Surgery

## 2011-08-25 VITALS — BP 139/87 | HR 99 | Resp 18 | Ht 74.0 in | Wt 197.0 lb

## 2011-08-25 DIAGNOSIS — I6529 Occlusion and stenosis of unspecified carotid artery: Secondary | ICD-10-CM | POA: Insufficient documentation

## 2011-08-25 NOTE — Progress Notes (Unsigned)
VASCULAR AND VEIN SURGERY POST - OP CEA PROGRESS NOTE  Date of Surgery: * No surgery found * Surgeon:   right Carotid Endarterectomy .  HPI: Eduardo Shaw is a 66 y.o. male who is  right Carotid Endarterectomy . Patient is doing well. Pre-operative symptoms are Improved Patient denies headache; Patient denies difficulty swallowing; denies weakness in upper or lower extremities; Pt. denies other symptoms of stroke or TIA.  IMAGING: No results found.  Significant Diagnostic Studies: CBC Lab Results  Component Value Date   WBC 8.9 08/04/2011   HGB 12.9* 08/04/2011   HCT 38.2* 08/04/2011   MCV 90.1 08/04/2011   PLT 145* 08/04/2011    BMET    Component Value Date/Time   NA 140 08/04/2011 0400   K 3.7 08/04/2011 0400   CL 107 08/04/2011 0400   CO2 24 08/04/2011 0400   GLUCOSE 110* 08/04/2011 0400   BUN 15 08/04/2011 0400   CREATININE 1.10 08/04/2011 0400   CALCIUM 8.5 08/04/2011 0400   GFRNONAA 68* 08/04/2011 0400   GFRAA 78* 08/04/2011 0400    COAG Lab Results  Component Value Date   INR 1.00 07/30/2011   No results found for this basename: PTT     @IOBRIEF @  Physical Exam:  BP Readings from Last 3 Encounters:  08/25/11 139/87  08/04/11 140/53  08/04/11 140/53   Temp Readings from Last 3 Encounters:  08/04/11 98 F (36.7 C) Oral  08/04/11 98 F (36.7 C) Oral   SpO2 Readings from Last 3 Encounters:  08/04/11 95%  08/04/11 95%  09/20/07 95%   Pulse Readings from Last 3 Encounters:  08/25/11 99  08/04/11 70  08/04/11 70    Pt is A&O x 3 Gait is normal Speech is fluent right Neck Wound is healing well Patient with Negative tongue deviation and Negative facial droop Pt has good and equal strength in all extremities  Assessment: Eduardo Shaw is a 66 y.o. male is S/P Right Carotid endarterectomy Pt is voiding, ambulating and taking po well   Plan: Discharge to: Home Follow-up in 6 months for carotid duplex months   Clinton Gallant  [facility] [phone] 08/25/2011 1:18 PM

## 2011-08-27 ENCOUNTER — Encounter: Payer: Self-pay | Admitting: Vascular Surgery

## 2011-09-06 ENCOUNTER — Other Ambulatory Visit: Payer: Self-pay | Admitting: Dermatology

## 2011-10-26 ENCOUNTER — Encounter: Payer: Self-pay | Admitting: Family Medicine

## 2011-10-26 ENCOUNTER — Other Ambulatory Visit: Payer: Self-pay | Admitting: Family Medicine

## 2011-10-26 ENCOUNTER — Ambulatory Visit (INDEPENDENT_AMBULATORY_CARE_PROVIDER_SITE_OTHER): Payer: Medicare Other | Admitting: Family Medicine

## 2011-10-26 VITALS — BP 122/70 | Temp 98.2°F | Ht 74.0 in | Wt 205.0 lb

## 2011-10-26 DIAGNOSIS — I6350 Cerebral infarction due to unspecified occlusion or stenosis of unspecified cerebral artery: Secondary | ICD-10-CM

## 2011-10-26 DIAGNOSIS — I6529 Occlusion and stenosis of unspecified carotid artery: Secondary | ICD-10-CM

## 2011-10-26 DIAGNOSIS — M25572 Pain in left ankle and joints of left foot: Secondary | ICD-10-CM

## 2011-10-26 DIAGNOSIS — N529 Male erectile dysfunction, unspecified: Secondary | ICD-10-CM

## 2011-10-26 DIAGNOSIS — M25579 Pain in unspecified ankle and joints of unspecified foot: Secondary | ICD-10-CM

## 2011-10-26 DIAGNOSIS — Z0000 Encounter for general adult medical examination without abnormal findings: Secondary | ICD-10-CM

## 2011-10-26 DIAGNOSIS — E785 Hyperlipidemia, unspecified: Secondary | ICD-10-CM

## 2011-10-26 DIAGNOSIS — I639 Cerebral infarction, unspecified: Secondary | ICD-10-CM

## 2011-10-26 DIAGNOSIS — Z125 Encounter for screening for malignant neoplasm of prostate: Secondary | ICD-10-CM

## 2011-10-26 DIAGNOSIS — Z85828 Personal history of other malignant neoplasm of skin: Secondary | ICD-10-CM

## 2011-10-26 DIAGNOSIS — Z23 Encounter for immunization: Secondary | ICD-10-CM

## 2011-10-26 LAB — BASIC METABOLIC PANEL
CO2: 29 mEq/L (ref 19–32)
GFR: 62.31 mL/min (ref >60.00)
Glucose, Bld: 71 mg/dL (ref 70–99)
Potassium: 4.4 mEq/L (ref 3.5–5.1)
Sodium: 143 mEq/L (ref 135–145)

## 2011-10-26 LAB — LIPID PANEL
LDL Cholesterol: 79 mg/dL (ref 0–99)
Total CHOL/HDL Ratio: 3

## 2011-10-26 LAB — HEPATIC FUNCTION PANEL
Albumin: 3.9 g/dL (ref 3.5–5.2)
Alkaline Phosphatase: 76 U/L (ref 39–117)
Bilirubin, Direct: 0.2 mg/dL (ref 0.0–0.3)

## 2011-10-26 MED ORDER — ATORVASTATIN CALCIUM 40 MG PO TABS: 40.0000 mg | ORAL_TABLET | Freq: Every day | ORAL | Status: DC

## 2011-10-26 MED ORDER — SILDENAFIL CITRATE 50 MG PO TABS: 50.0000 mg | ORAL_TABLET | ORAL | Status: DC | PRN

## 2011-10-26 NOTE — Patient Instructions (Signed)
Continue your current medication  Call Dr. Toni Arthurs at [facility] orthopedics for evaluation of your left ankle  We will get you set up to see Dr. Excell Seltzer in cardiology for a stress test  I will call you I gets her lab work back  Viagra 50 mg,,,,,,,,,,,,, one half tablet one to 2 hours prior to sex  Return in one year for general physical exam sooner if any problems  I would recommend Dr. Gweneth Dimitri for an eye exam

## 2011-10-26 NOTE — Progress Notes (Signed)
Subjective:    Patient ID: Eduardo Shaw, male    DOB: [birth year], 66 y.o.   MRN: [id number]  HPI Eduardo Shaw is a delightful 66 year old married male nonsmoker who comes in today as a new patient for general physical examination  He is always been in excellent health until this past June when he experienced some numbness and tingling in his right arm. One morning he woke up and it was totally he therefore went to the hospital had a complete evaluation and turned out to have a right carotid stenosis. He also had a small infarct. He underwent right carotid endarterectomy by Dr. Durwin Nora and did well no complications. Neurologically intact  Workup showed a 40% left carotid but otherwise no other vascular abnormalities.  He's also had a long-standing history of pain and swelling of his left ankle etiology unknown  He's also had a history of skin cancer. He's had multiple procedures and is followed on a regular basis by Dr. Linton Rump  He's not had an eye exam recently however he does get regular dental care. No history of any cardiac pulmonary GI disease. Had a colonoscopy about 10 years ago was normal. He has a history of lactose intolerance. No genitourinary problems except for some recent issues with erectile dysfunction  No thyroid problems weight steady musculoskeletal negative except for the pain in his left ankle. Vascular negative except for above. Social history he's married he lives in [facility] [place] he has 4 children. He works for an [employer]. Family history was recorded vaccinations not sure.   Review of Systems  Constitutional: Negative.   HENT: Negative.   Eyes: Negative.   Respiratory: Negative.   Cardiovascular: Negative.   Gastrointestinal: Negative.   Genitourinary: Negative.   Musculoskeletal: Positive for joint swelling.  Skin: Negative.   Neurological: Negative.   Hematological: Negative.   Psychiatric/Behavioral: Negative.        Objective:   Physical Exam  Constitutional: He is oriented to person, place, and time. He appears well-developed and well-nourished.  HENT:  Head: Normocephalic and atraumatic.  Right Ear: External ear normal.  Left Ear: External ear normal.  Nose: Nose normal.  Mouth/Throat: Oropharynx is clear and moist.  Eyes: Conjunctivae normal and EOM are normal. Pupils are equal, round, and reactive to light.  Neck: Normal range of motion. Neck supple. No JVD present. No tracheal deviation present. No thyromegaly present.  Cardiovascular: Normal rate, regular rhythm, normal heart sounds and intact distal pulses.  Exam reveals no gallop and no friction rub.   No murmur heard.      Right carotid no bruits,,,,,,,,,,, left carotid bruit very soft  Pulmonary/Chest: Effort normal and breath sounds normal. No stridor. No respiratory distress. He has no wheezes. He has no rales. He exhibits no tenderness.  Abdominal: Soft. Bowel sounds are normal. He exhibits no distension and no mass. There is no tenderness. There is no rebound and no guarding.  Genitourinary: Rectum normal, prostate normal and penis normal. Guaiac negative stool. No penile tenderness.  Musculoskeletal: Normal range of motion. He exhibits no edema and no tenderness.       Tenderness and swelling left ankle good circulation  Lymphadenopathy:    He has no cervical adenopathy.  Neurological: He is alert and oriented to person, place, and time. He has normal reflexes. No cranial nerve deficit. He exhibits normal muscle tone.  Skin: Skin is warm and dry. No rash noted. No erythema. No pallor.       Total  body skin exam shows a scar the right neck from previous carotid endarterectomy and some erythema of the shoulder worries recently had some treatment by Dr. [place] for skin cancer.  Psychiatric: He has a normal mood and affect. His behavior is normal. Judgment and thought content normal.          Assessment & Plan:  Healthy male  Hyperlipidemia  recheck labs  Pain left ankle etiology unknown consult with Dr. Toni Arthurs  Status post right carotid endarterectomy with a 40% left yearly followup by Dr. Durwin Nora  Also we'll set him up for a screening cardiac stress test because of his history as above. Even now he has no symptoms however he does not exert himself on his strenuous basis.  History of lactose intolerance

## 2011-11-09 ENCOUNTER — Ambulatory Visit (INDEPENDENT_AMBULATORY_CARE_PROVIDER_SITE_OTHER): Payer: Medicare Other | Admitting: Physician Assistant

## 2011-11-09 ENCOUNTER — Encounter: Payer: Self-pay | Admitting: Physician Assistant

## 2011-11-09 DIAGNOSIS — R9439 Abnormal result of other cardiovascular function study: Secondary | ICD-10-CM

## 2011-11-09 DIAGNOSIS — I6529 Occlusion and stenosis of unspecified carotid artery: Secondary | ICD-10-CM

## 2011-11-09 DIAGNOSIS — I7090 Unspecified atherosclerosis: Secondary | ICD-10-CM

## 2011-11-09 NOTE — Procedures (Signed)
Eduardo Shaw is a 66 y.o. male referred for routine ETT.  No hx of CAD.  He has a hx of stroke, carotid stenosis, s/p right CEA 6/13, HL.  No chest pain, dyspnea, syncope.  Exam unremarkable.   Exercise Treadmill Test  Pre-Exercise Testing Evaluation Rhythm: normal sinus  Rate: 75   PR:  .15 QRS:  .08  QT:  .38 QTc: .42     Test  Exercise Tolerance Test Ordering MD: Kelle Darting , MD  Interpreting MD: Tereso Newcomer , PA-C  Unique Test No: 1  Treadmill:  1  Indication for ETT: Atherosclerosis  Contraindication to ETT: No   Stress Modality: exercise - treadmill  Cardiac Imaging Performed: non   Protocol: standard Bruce - maximal  Max BP:  194/52  Max MPHR (bpm):  153 85% MPR (bpm):  130  MPHR obtained (bpm):  141 % MPHR obtained:  91%  Reached 85% MPHR (min:sec):  4:15 Total Exercise Time (min-sec):  5:00  Workload in METS:  7.0 Borg Scale: 13  Reason ETT Terminated:  desired heart rate attained    ST Segment Analysis At Rest: normal ST segments - no evidence of significant ST depression With Exercise: borderline ST changes  Other Information Arrhythmia:  Frequent PVCs, bigeminy at peak exercise and recovery Angina during ETT:  absent (0) Quality of ETT:  indeterminate  ETT Interpretation:  borderline (indeterminate) with non-specific ST changes  Comments: Fair exercise tolerance. Patient limited by ankle pain. No chest pain. Normal BP response to exercise. Borderline ST depression in peak exercise in V3-4.  Cannot completely rule out ischemia. Increased ventricular ectopy in recovery with some bigeminy.   Recommendations: With known vascular disease, recommend Lexiscan myoview. D/w Cletus Gash. We will schedule the test for him. Tereso Newcomer, PA-C  12:31 PM 11/09/2011

## 2011-11-09 NOTE — Patient Instructions (Addendum)
Your physician has requested that you have a lexiscan myoview. For further information please visit [email or web address] Please follow instruction sheet, as given. DX:  ABNORMAL STRESS TEST

## 2011-11-18 ENCOUNTER — Ambulatory Visit (HOSPITAL_COMMUNITY): Payer: Medicare Other | Attending: Cardiology | Admitting: Radiology

## 2011-11-18 VITALS — BP 121/78 | HR 64 | Ht 74.0 in | Wt 195.0 lb

## 2011-11-18 DIAGNOSIS — R9431 Abnormal electrocardiogram [ECG] [EKG]: Secondary | ICD-10-CM | POA: Insufficient documentation

## 2011-11-18 DIAGNOSIS — E785 Hyperlipidemia, unspecified: Secondary | ICD-10-CM

## 2011-11-18 DIAGNOSIS — R9439 Abnormal result of other cardiovascular function study: Secondary | ICD-10-CM

## 2011-11-18 DIAGNOSIS — I779 Disorder of arteries and arterioles, unspecified: Secondary | ICD-10-CM | POA: Insufficient documentation

## 2011-11-18 DIAGNOSIS — Z8673 Personal history of transient ischemic attack (TIA), and cerebral infarction without residual deficits: Secondary | ICD-10-CM | POA: Insufficient documentation

## 2011-11-18 DIAGNOSIS — I6529 Occlusion and stenosis of unspecified carotid artery: Secondary | ICD-10-CM

## 2011-11-18 MED ORDER — TECHNETIUM TC 99M SESTAMIBI GENERIC - CARDIOLITE
11.0000 | Freq: Once | INTRAVENOUS | Status: AC | PRN
  Administered 2011-11-18: 11 via INTRAVENOUS

## 2011-11-18 MED ORDER — TECHNETIUM TC 99M SESTAMIBI GENERIC - CARDIOLITE
33.0000 | Freq: Once | INTRAVENOUS | Status: AC | PRN
  Administered 2011-11-18: 33 via INTRAVENOUS

## 2011-11-18 MED ORDER — REGADENOSON 0.4 MG/5ML IV SOLN
0.4000 mg | Freq: Once | INTRAVENOUS | Status: AC
  Administered 2011-11-18: 0.4 mg via INTRAVENOUS

## 2011-11-18 NOTE — Progress Notes (Signed)
[facility] SITE 3 NUCLEAR MED [id number] [place] [address] [phone]  Cardiology Nuclear Med Study  Eduardo Shaw is a 66 y.o. male     MRN : [id number]     DOB: July 26, 1945  Procedure Date: 11/18/2011  Nuclear Med Background Indication for Stress Test:  Evaluation for Ischemia, and Abnormal GXT  History: 6/13 Echo: EF=55-60%, 11/09/11 [id number] Cardiac Risk Factors: Carotid Disease, CVA and Lipids  Symptoms:  No cardiac symptoms.   Nuclear Pre-Procedure Caffeine/Decaff Intake:  None > 12 hrs NPO After: 7:30pm   Lungs:  Clear. O2 Sat: 98% on room air. IV 0.9% NS with Angio Cath:  22g  IV Site: R Hand x 1, tolerated well IV Started by:  Irean Hong, RN  Chest Size (in):  44 Cup Size: n/a  Height: 6\' 2"  (1.88 m)  Weight:  195 lb (88.451 kg)  BMI:  Body mass index is 25.04 kg/(m^2). Tech Comments:  n/a    Nuclear Med Study 1 or 2 day study: 1 day  Stress Test Type:  Treadmill/Lexiscan  Reading MD: Marca Ancona, MD  Order Authorizing Provider:  Charlton Haws, MD and Tereso Newcomer, PA-C  Resting Radionuclide: Technetium 66m Sestamibi  Resting Radionuclide Dose: 11.0 mCi   Stress Radionuclide:  Technetium 63m Sestamibi  Stress Radionuclide Dose: 33.0 mCi           Stress Protocol Rest HR: 64 Stress HR: 112  Rest BP: 121/78 Stress BP: 176/58  Exercise Time (min): 2:00 METS: n/a   Predicted Max HR: 153 bpm % Max HR: 73.2 bpm Rate Pressure Product: 10272   Dose of Adenosine (mg):  n/a Dose of Lexiscan: 0.4 mg  Dose of Atropine (mg): n/a Dose of Dobutamine: n/a   Stress Test Technologist: Smiley Houseman, CMA-N  Nuclear Technologist:  Domenic Polite, CNMT     Rest Procedure:  Myocardial perfusion imaging was performed at rest 45 minutes following the intravenous administration of Technetium 77m Sestamibi.  Rest ECG: No acute changes.  Stress Procedure:  The patient received IV Lexiscan 0.4 mg over 15-seconds with  concurrent low level exercise and then Technetium 66m Sestamibi was injected at 30-seconds while the patient continued walking one more minute. There were no significant changes with Lexiscan. Quantitative spect images were obtained after a 45-minute delay.  Stress ECG: No significant change from baseline ECG  QPS Raw Data Images:  Normal; no motion artifact; normal heart/lung ratio. Stress Images:  Small, mild apical perfusion defect.  Rest Images:  Small, mild apical perfusion defect.  Subtraction (SDS):  Fixed small, mild apical perfusion defect.  Transient Ischemic Dilatation (Normal <1.22):  1.10 Lung/Heart Ratio (Normal <0.45):  0.30  Quantitative Gated Spect Images QGS EDV:  107 ml QGS ESV:  54 ml  Impression Exercise Capacity:  Lexiscan with low level exercise. BP Response:  Normal blood pressure response. Clinical Symptoms:  No symptoms. ECG Impression:  No significant ST changes suggestive of ischemia.  Comparison with Prior Nuclear Study: No previous nuclear study performed  Overall Impression:  Low risk stress nuclear study.  Small, fixed mild apical perfusion defect.  This most likely represents apical thinning.  There is mild global hypokinesis.  Would consider echocardiogram to confirm LV systolic function.   LV Ejection Fraction: 49%.  LV Wall Motion:  Mild global hypokinesis.   Marca Ancona 11/18/2011

## 2011-11-23 ENCOUNTER — Encounter (HOSPITAL_COMMUNITY): Payer: Self-pay

## 2011-11-23 ENCOUNTER — Telehealth: Payer: Self-pay | Admitting: *Deleted

## 2011-11-23 DIAGNOSIS — R9430 Abnormal result of cardiovascular function study, unspecified: Secondary | ICD-10-CM

## 2011-11-23 NOTE — Telephone Encounter (Signed)
lmptcb to go over [place] results and to schedule echo to assess EF per recommendations from [place] W. PAC; I placed echo order today

## 2011-11-23 NOTE — Telephone Encounter (Signed)
Message copied by Tarri Fuller on Tue Nov 23, 2011  2:26 PM ------      Message from: [place], [place] T      Created: Sun Nov 21, 2011  8:05 PM       Apical thinning      No ischemia      EF 49%      Low risk stress test      But needs echo to confirm EF measurement      Please arrange echocardiogram.      I will cc copy of stress test to TODD,JEFFREY Freida Busman, MD       Tereso Newcomer, PA-C  8:05 PM 11/21/2011

## 2011-11-24 NOTE — Telephone Encounter (Signed)
Follow-up: ° ° ° °Patient called returning your call. Please call back. °

## 2011-11-24 NOTE — Telephone Encounter (Signed)
S/w pt about myoview results and needing echo to assess EF. Pt asked why was EF 49%. I then stated can be due to other health conditions, which I then stated about Sarcoidosis which is listed in his problem list. When I told pt this he asked what it was and that he did not know he had this condition. I then explained that sarcoidosis is when a group of cells form together which are then called granulomas and that this may be in the heart or lungs or other organs as well. Pt said he does now anything about this being on his chart. I advised then that he should probably s/w PCP about sarcoidosis on chart. I had said I saw the name of the person who entered this dx back on 2009 but I did not know who this was. Pt then asked me for the name of who entered dx and I advised that I could disclose that but would still advise for him to s/w PCP about dx on chart. I told him that I will s/w my clinical manager and see if we can figure this out as to who, how, where, when that was was entered on chart. Pt coming in 10/17 for echo to assess EF. I explained that when I find out something about the sarcoidosis dx I will let him know, pt then said thank you to me.

## 2011-11-25 ENCOUNTER — Ambulatory Visit (HOSPITAL_COMMUNITY): Payer: Medicare Other | Attending: Cardiovascular Disease | Admitting: Radiology

## 2011-11-25 DIAGNOSIS — R9430 Abnormal result of cardiovascular function study, unspecified: Secondary | ICD-10-CM

## 2011-11-25 DIAGNOSIS — I369 Nonrheumatic tricuspid valve disorder, unspecified: Secondary | ICD-10-CM | POA: Insufficient documentation

## 2011-11-25 DIAGNOSIS — I059 Rheumatic mitral valve disease, unspecified: Secondary | ICD-10-CM | POA: Insufficient documentation

## 2011-11-25 NOTE — Progress Notes (Signed)
Echocardiogram performed.  

## 2012-02-22 ENCOUNTER — Encounter: Payer: Self-pay | Admitting: Vascular Surgery

## 2012-02-23 ENCOUNTER — Ambulatory Visit: Payer: Medicare Other | Admitting: Vascular Surgery

## 2012-02-23 ENCOUNTER — Other Ambulatory Visit: Payer: Medicare Other

## 2012-03-07 ENCOUNTER — Encounter: Payer: Self-pay | Admitting: Neurosurgery

## 2012-03-08 ENCOUNTER — Encounter: Payer: Self-pay | Admitting: Neurosurgery

## 2012-03-08 ENCOUNTER — Other Ambulatory Visit (INDEPENDENT_AMBULATORY_CARE_PROVIDER_SITE_OTHER): Payer: Medicare Other | Admitting: Vascular Surgery

## 2012-03-08 ENCOUNTER — Ambulatory Visit (INDEPENDENT_AMBULATORY_CARE_PROVIDER_SITE_OTHER): Payer: Medicare Other | Admitting: Neurosurgery

## 2012-03-08 VITALS — BP 145/80 | HR 58 | Resp 16 | Ht 74.0 in | Wt 197.0 lb

## 2012-03-08 DIAGNOSIS — I6529 Occlusion and stenosis of unspecified carotid artery: Secondary | ICD-10-CM

## 2012-03-08 DIAGNOSIS — Z48812 Encounter for surgical aftercare following surgery on the circulatory system: Secondary | ICD-10-CM

## 2012-03-08 NOTE — Progress Notes (Signed)
[facility] Carotid Office Note  CC: Carotid surveillance Referring Physician: Edilia Bo  History of Present Illness: 67 year old male patient of Dr. Edilia Bo status post right CEA in June 2013. The patient denies any signs or symptoms of CVA, TIA, amaurosis fugax or any neural deficit. The patient denies any new medical diagnoses or recent surgery.  Past Medical History  Diagnosis Date  . Hypercholesterolemia   . Arthritis of foot   . Skin cancer   . Lactose intolerance   . Carotid artery occlusion     ROS: [x]  Positive   [ ]  Denies    General: [ ]  Weight loss, [ ]  Fever, [ ]  chills Neurologic: [ ]  Dizziness, [ ]  Blackouts, [ ]  Seizure [ ]  Stroke, [ ]  "Mini stroke", [ ]  Slurred speech, [ ]  Temporary blindness; [ ]  weakness in arms or legs, [ ]  Hoarseness Cardiac: [ ]  Chest pain/pressure, [ ]  Shortness of breath at rest [ ]  Shortness of breath with exertion, [ ]  Atrial fibrillation or irregular heartbeat Vascular: [ ]  Pain in legs with walking, [ ]  Pain in legs at rest, [ ]  Pain in legs at night,  [ ]  Non-healing ulcer, [ ]  Blood clot in vein/DVT,   Pulmonary: [ ]  Home oxygen, [ ]  Productive cough, [ ]  Coughing up blood, [ ]  Asthma,  [ ]  Wheezing Musculoskeletal:  [ ]  Arthritis, [ ]  Low back pain, [ ]  Joint pain Hematologic: [ ]  Easy Bruising, [ ]  Anemia; [ ]  Hepatitis Gastrointestinal: [ ]  Blood in stool, [ ]  Gastroesophageal Reflux/heartburn, [ ]  Trouble swallowing Urinary: [ ]  chronic Kidney disease, [ ]  on HD - [ ]  MWF or [ ]  TTHS, [ ]  Burning with urination, [ ]  Difficulty urinating Skin: [ ]  Rashes, [ ]  Wounds Psychological: [ ]  Anxiety, [ ]  Depression   Social History History  Substance Use Topics  . Smoking status: Never Smoker   . Smokeless tobacco: Never Used  . Alcohol Use: Yes     Comment: 1-2 drinks on weekends    Family History Family History  Problem Relation Age of Onset  . Lupus Mother   . Stroke Mother   . Lupus Sister      Allergies  Allergen Reactions  . Morphine And Related     Gi upset    Current Outpatient Prescriptions  Medication Sig Dispense Refill  . aspirin 325 MG tablet Take 1 tablet (325 mg total) by mouth daily.      Marland Kitchen atorvastatin (LIPITOR) 40 MG tablet Take 1 tablet (40 mg total) by mouth daily at 6 PM.  100 tablet  3  . Multiple Vitamins-Minerals (CENTRUM SILVER ADULT 50+ PO) Take by mouth.      . sildenafil (VIAGRA) 50 MG tablet Take 1 tablet (50 mg total) by mouth as needed for erectile dysfunction.  6 tablet  11    Physical Examination  Filed Vitals:   03/08/12 1421  BP: 145/80  Pulse: 58  Resp:     Body mass index is 25.29 kg/(m^2).  General:  WDWN in NAD Gait: Normal HEENT: WNL Eyes: Pupils equal Pulmonary: normal non-labored breathing , without Rales, rhonchi,  wheezing Cardiac: RRR, without  Murmurs, rubs or gallops; Abdomen: soft, NT, no masses Skin: no rashes, ulcers noted  Vascular Exam Pulses: 3+ radial pulses bilaterally Carotid bruits: Carotid pulses to auscultation no bruits are heard Extremities without ischemic changes, no Gangrene , no cellulitis; no open wounds;  Musculoskeletal: no muscle wasting or  atrophy   Neurologic: A&O X 3; Appropriate Affect ; SENSATION: normal; MOTOR FUNCTION:  moving all extremities equally. Speech is fluent/normal  Non-Invasive Vascular Imaging CAROTID DUPLEX 03/08/2012  Right ICA 0 - 19% stenosis Left ICA 20 - 39 % stenosis   ASSESSMENT/PLAN: Asymptomatic patient that'll followup in 6 months with repeat carotid duplex. The patient understands that he is stable at that time we will move to one-year intervals. The patient's questions were encouraged and answered, he is in agreement with this plan.  Lauree Chandler ANP   Clinic MD: Edilia Bo

## 2012-03-09 ENCOUNTER — Encounter: Payer: Self-pay | Admitting: Neurosurgery

## 2012-03-09 NOTE — Addendum Note (Signed)
Addended by: Sharee Pimple on: 03/09/2012 08:18 AM   Modules accepted: Orders

## 2012-09-06 ENCOUNTER — Ambulatory Visit: Payer: Self-pay | Admitting: Neurosurgery

## 2012-09-06 ENCOUNTER — Other Ambulatory Visit: Payer: Self-pay

## 2012-09-26 ENCOUNTER — Encounter: Payer: Self-pay | Admitting: Vascular Surgery

## 2012-09-27 ENCOUNTER — Other Ambulatory Visit: Payer: Self-pay

## 2012-09-27 ENCOUNTER — Ambulatory Visit: Payer: Self-pay | Admitting: Family

## 2012-10-03 ENCOUNTER — Encounter: Payer: Self-pay | Admitting: Family

## 2012-10-03 ENCOUNTER — Ambulatory Visit (INDEPENDENT_AMBULATORY_CARE_PROVIDER_SITE_OTHER): Payer: Medicare Other | Admitting: Family

## 2012-10-03 ENCOUNTER — Telehealth: Payer: Self-pay | Admitting: Family Medicine

## 2012-10-03 DIAGNOSIS — R609 Edema, unspecified: Secondary | ICD-10-CM

## 2012-10-03 DIAGNOSIS — R600 Localized edema: Secondary | ICD-10-CM

## 2012-10-03 DIAGNOSIS — M25469 Effusion, unspecified knee: Secondary | ICD-10-CM

## 2012-10-03 DIAGNOSIS — T6391XA Toxic effect of contact with unspecified venomous animal, accidental (unintentional), initial encounter: Secondary | ICD-10-CM

## 2012-10-03 DIAGNOSIS — T63461A Toxic effect of venom of wasps, accidental (unintentional), initial encounter: Secondary | ICD-10-CM

## 2012-10-03 DIAGNOSIS — M25462 Effusion, left knee: Secondary | ICD-10-CM

## 2012-10-03 MED ORDER — PREDNISONE 20 MG PO TABS: 60.0000 mg | ORAL_TABLET | Freq: Every day | ORAL | Status: AC

## 2012-10-03 MED ORDER — METHYLPREDNISOLONE ACETATE 80 MG/ML IJ SUSP: 120.0000 mg | Freq: Once | INTRAMUSCULAR | Status: DC

## 2012-10-03 MED ORDER — METHYLPREDNISOLONE ACETATE 40 MG/ML IJ SUSP
80.0000 mg | Freq: Once | INTRAMUSCULAR | Status: AC
  Administered 2012-10-03: 80 mg via INTRAMUSCULAR

## 2012-10-03 NOTE — Patient Instructions (Addendum)
Bee, Wasp, or Hornet Sting °Your caregiver has diagnosed you as having an insect sting. An insect sting appears as a red lump in the skin that sometimes has a tiny hole in the center, or it may have a stinger in the center of the wound. The most common stings are from wasps, hornets and bees. °Individuals have different reactions to insect stings. °· A normal reaction may cause pain, swelling, and redness around the sting site. °· A localized allergic reaction may cause swelling and redness that extends beyond the sting site. °· A large local reaction may continue to develop over the next 12 to 36 hours. °· On occasion, the reactions can be severe (anaphylactic reaction). An anaphylactic reaction may cause wheezing; difficulty breathing; chest pain; fainting; raised, itchy, red patches on the skin; a sick feeling to your stomach (nausea); vomiting; cramping; or diarrhea. If you have had an anaphylactic reaction to an insect sting in the past, you are more likely to have one again. °HOME CARE INSTRUCTIONS  °· With bee stings, a small sac of poison is left in the wound. Brushing across this with something such as a credit card, or anything similar, will help remove this and decrease the amount of the reaction. This same procedure will not help a wasp sting as they do not leave behind a stinger and poison sac. °· Apply a cold compress for 10 to 20 minutes every hour for 1 to 2 days, depending on severity, to reduce swelling and itching. °· To lessen pain, a paste made of water and baking soda may be rubbed on the bite or sting and left on for 5 minutes. °· To relieve itching and swelling, you may use take medication or apply medicated creams or lotions as directed. °· Only take over-the-counter or prescription medicines for pain, discomfort, or fever as directed by your caregiver. °· Wash the sting site daily with soap and water. Apply antibiotic ointment on the sting site as directed. °· If you suffered a severe  reaction: °· If you did not require hospitalization, an adult will need to stay with you for 24 hours in case the symptoms return. °· You may need to wear a medical bracelet or necklace stating the allergy. °· You and your family need to learn when and how to use an anaphylaxis kit or epinephrine injection. °· If you have had a severe reaction before, always carry your anaphylaxis kit with you. °SEEK MEDICAL CARE IF:  °· None of the above helps within 2 to 3 days. °· The area becomes red, warm, tender, and swollen beyond the area of the bite or sting. °· You have an oral temperature above 102° F (38.9° C). °SEEK IMMEDIATE MEDICAL CARE IF:  °You have symptoms of an allergic reaction which are: °· Wheezing. °· Difficulty breathing. °· Chest pain. °· Lightheadedness or fainting. °· Itchy, raised, red patches on the skin. °· Nausea, vomiting, cramping or diarrhea. °ANY OF THESE SYMPTOMS MAY REPRESENT A SERIOUS PROBLEM THAT IS AN EMERGENCY. Do not wait to see if the symptoms will go away. Get medical help right away. Call your local emergency services (911 in [place] DO NOT drive yourself to the hospital. °MAKE SURE YOU:  °· Understand these instructions. °· Will watch your condition. °· Will get help right away if you are not doing well or get worse. °Document Released: 01/25/2005 Document Revised: 04/19/2011 Document Reviewed: 07/12/2009 °ExitCare® Patient Information ©2014 [employer]. ° °

## 2012-10-03 NOTE — Progress Notes (Signed)
  Subjective:    Patient ID: Eduardo Shaw, male    DOB: [birth year], 67 y.o.   MRN: [id number]  HPI 67 year old white male, patient of Dr. Tawanna Cooler is in today after stepping on the L. injected neck to yesterday and being stung x3 to the left knee, right hand, and right ankle. He subsequently took Benadryl for his symptoms and local this morning with worsening swelling. Denies any shortness of breath, chest pain or palpitations.   Review of Systems  Constitutional: Negative.   HENT: Negative.   Respiratory: Negative.  Negative for shortness of breath and wheezing.   Cardiovascular: Negative.  Negative for chest pain and palpitations.  Musculoskeletal: Negative for back pain.       Right ankle and hand swelling. Left knee swelling.   Skin: Positive for wound.       Left knee, right hand and ankle  Allergic/Immunologic:       Yellow jackets  Neurological: Negative.   Psychiatric/Behavioral: Negative.        Objective:   Physical Exam  Constitutional: He is oriented to person, place, and time. He appears well-developed and well-nourished.  Neck: Normal range of motion.  Cardiovascular: Normal rate, regular rhythm and normal heart sounds.   Pulmonary/Chest: Effort normal and breath sounds normal.  Musculoskeletal: He exhibits edema and tenderness.  Neurological: He is alert and oriented to person, place, and time.  Skin: Skin is warm and dry. There is erythema.     Psychiatric: He has a normal mood and affect.          Assessment & Plan:  Assessment: 1. Bee sting-allergic reaction 2. Right ankle swelling 3. Left knee swelling 4. Right hand swelling  Plan: Prednisone 60 mg by mouth daily x5 days. Discussed side effects. Patient called the office with any questions or concerns. Check as schedule, and as needed.

## 2012-10-03 NOTE — Telephone Encounter (Signed)
Patient Information:  Caller Name: Greg  Phone: [phone]  Patient: Eduardo Shaw, Eduardo Shaw  Gender: Male  DOB: 1945/05/24  Age: 67 Years  PCP: Kelle Darting [facility])  Office Follow Up:  Does the office need to follow up with this patient?: No  Instructions For The Office: N/A   Symptoms  Reason For Call & Symptoms: mulitple bee stings on right hand, left knee and right foot.  Caller states that bee sting sites are red, itchy and swollen  Reviewed Health History In EMR: Yes  Reviewed Medications In EMR: Yes  Reviewed Allergies In EMR: Yes  Reviewed Surgeries / Procedures: Yes  Date of Onset of Symptoms: 10/02/2012  Treatments Tried: Benadryl, ice  Treatments Tried Worked: No  Guideline(s) Used:  Warden/ranger  Disposition Per Guideline:   See Today in Office  Reason For Disposition Reached:   Patient wants to be seen  Advice Given:  N/A  Patient Will Follow Care Advice:  YES  Appointment Scheduled:  10/03/2012 14:45:00 Appointment Scheduled Provider:  Adline Mango (Family Practice)

## 2012-10-10 ENCOUNTER — Encounter: Payer: Self-pay | Admitting: Family

## 2012-10-11 ENCOUNTER — Encounter: Payer: Self-pay | Admitting: Family

## 2012-10-11 ENCOUNTER — Other Ambulatory Visit (INDEPENDENT_AMBULATORY_CARE_PROVIDER_SITE_OTHER): Payer: Medicare Other | Admitting: *Deleted

## 2012-10-11 ENCOUNTER — Ambulatory Visit (INDEPENDENT_AMBULATORY_CARE_PROVIDER_SITE_OTHER): Payer: Medicare Other | Admitting: Family

## 2012-10-11 DIAGNOSIS — I6529 Occlusion and stenosis of unspecified carotid artery: Secondary | ICD-10-CM

## 2012-10-11 DIAGNOSIS — Z48812 Encounter for surgical aftercare following surgery on the circulatory system: Secondary | ICD-10-CM

## 2012-10-11 NOTE — Progress Notes (Signed)
Established Carotid Patient  Previous Carotid surgery: Yes Surgeon: Dr. Edilia Bo  History of Present Illness  Eduardo Shaw is a 67 y.o. male who returns for scheduled carotid arteries surveillance s/p right CEA on 08/03/2011 by Dr. Edilia Bo.  Patient has Negative history of TIA or stroke symptom.  The patient denies amaurosis fugax or monocular blindness.  The patient  denies facial drooping.  Pt. denies hemiplegia.  The patient denies receptive or expressive aphasia.  Pt. denies extremity weakness. The patient's previous neurologic deficits are Unchanged.  Patient denies New Medical or Surgical History.  Pt Diabetic: No Pt smoker: non-smoker  Pt meds include: Statin : Yes Betablocker: No ASA: Yes, 325 mg daily. Other anticoagulants/antiplatelets: none   Past Medical History  Diagnosis Date  . Hypercholesterolemia   . Arthritis of foot   . Skin cancer   . Lactose intolerance   . Carotid artery occlusion     Social History History  Substance Use Topics  . Smoking status: Never Smoker   . Smokeless tobacco: Never Used  . Alcohol Use: Yes     Comment: 1-2 drinks on weekends    Family History Family History  Problem Relation Age of Onset  . Lupus Mother   . Stroke Mother   . Lupus Sister     Surgical History Past Surgical History  Procedure Laterality Date  . Mohs surgery      to face  . Endarterectomy  08/03/2011    Procedure: ENDARTERECTOMY CAROTID;  Surgeon: Chuck Hint, MD;  Location: [facility] OR;  Service: Vascular;  Laterality: Right;    Allergies  Allergen Reactions  . Morphine And Related     Gi upset  . Other     Bee sting    Current Outpatient Prescriptions  Medication Sig Dispense Refill  . atorvastatin (LIPITOR) 40 MG tablet Take 1 tablet (40 mg total) by mouth daily at 6 PM.  100 tablet  3  . Multiple Vitamins-Minerals (CENTRUM SILVER ADULT 50+ PO) Take by mouth.      . sildenafil (VIAGRA) 50 MG tablet Take 1 tablet (50 mg total) by  mouth as needed for erectile dysfunction.  6 tablet  11  . predniSONE (DELTASONE) 20 MG tablet Take 3 tablets (60 mg total) by mouth daily.  15 tablet  0   No current facility-administered medications for this visit.   No longer taking prednisone.   Review of Systems : [x]  Positive   [ ]  Denies  General:[ ]  Weight loss,  [ ]  Weight gain, [ ]  Loss of appetite, [ ]  Fever, [ ]  chills  Neurologic: [ ]  Dizziness, [ ]  Blackouts, [ ]  Headaches, [ ]  Seizure [ ]  Stroke, [ ]  "Mini stroke", [ ]  Slurred speech, [ ]  Temporary blindness;  [ ] weakness,  Ear/Nose/Throat: [ ]  Change in hearing, [ ]  Nose bleeds, [ ]  Hoarseness  Vascular:[ ]  Pain in legs with walking, [ ]  Pain in feet while lying flat , [ ]   Non-healing ulcer, [ ]  Blood clot in vein,    Pulmonary: [ ]  Home oxygen, [ ]   Productive cough, [ ]  Bronchitis, [ ]  Coughing up blood,  [ ]  Asthma, [ ]  Wheezing  Musculoskeletal:  [ X] Arthritis, [ ]  Joint pain, [ ]  low back pain  Cardiac: [ ]  Chest pain, [ ]  Shortness of breath when lying flat, [ ]  Shortness of breath with exertion, [ ]  Palpitations, [ ]  Heart murmur, [ ]   Atrial fibrillation  Hematologic:[ ]  Easy Bruising, [ ]   Anemia; [ ]  Hepatitis  Psychiatric: [ ]   Depression, [ ]  Anxiety   Gastrointestinal: [ ]  Black stool, [ ]  Blood in stool, [ ]  Peptic ulcer disease,  [ ]  Gastroesophageal Reflux, [ ]  Trouble swallowing, [ ]  Diarrhea, [ ]  Constipation  Urinary: [ ]  chronic Kidney disease, [ ]  on HD, [ ]  Burning with urination, [ ]  Frequent urination, [ ]  Difficulty urinating;   Skin: [ ]  Rashes, [ ]  Wounds    Physical Examination  Filed Vitals:   10/11/12 1416  BP: 133/74  Pulse: 74  Resp:     General: WDWN male in NAD GAIT: normal Eyes: PERRLA Pulmonary:  CTAB, Negative  Rales, Negative rhonchi, & Negative wheezing.  Cardiac: regular Rhythm ,  Negative Murmurs.  VASCULAR EXAM Carotid Bruits Left Right   Negative Negative                                                                                                                                 LE Pulses LEFT RIGHT       FEMORAL   palpable   palpable        POPLITEAL  not palpable   not palpable       POSTERIOR TIBIAL   palpable    palpable        DORSALIS PEDIS      ANTERIOR TIBIAL  palpable    palpable     Gastrointestinal: soft, nontender, BS WNL, no r/g,  negative masses.  Musculoskeletal: Negative muscle atrophy/wasting. M/S 5/5 throughout, Extremities without ischemic changes.  Neurologic: A&O X 3; Appropriate Affect ; SENSATION ;normal; MOTOR FUNCTION: normal 5/5 strength in all tested muscle groups Speech is normal CN 2-12 intact, Pain and light touch intact in extremities, Motor exam as listed above.   Non-Invasive Vascular Imaging CAROTID DUPLEX 10/11/2012   Right ICA patent with no evidence of restenosis, unchanged from 03/08/12. Left ICA 40 - 59 % stenosis, compared to 03/08/12 at 1%-39% (high end of range). The left ICA Duplex result today has not changed significantly from a year ago (high end of 1-39% a year ago and 40-59% stenosis today).   Assessment: Eduardo Shaw is a 67 y.o. male who presents for scheduled surveillance of bilateral carotid arteries. After discussing with Dr. Edilia Bo, and based on Duplex study and evaluation today, since he is asymptomatic will schedule to return in a year for bilateral carotid Duplex. The  ICA stenosis is  Unchanged from previous exam. Pt. is not a candidate for CEA at this time  Plan: Follow-up in 1 year with Carotid Duplex scan.  I discussed in depth with the patient the nature of atherosclerosis, and emphasized the importance of maximal medical management including strict control of blood pressure, blood glucose, and lipid levels, obtaining regular exercise, and continued cessation of smoking.  The patient is aware that without maximal medical management the underlying atherosclerotic disease process will progress, limiting the  benefit of any interventions. Pt was given information regarding stroke symptoms and prevention. Thank you for allowing [place] to participate in this patient's care.  Charisse March, RN, MSN, FNP-C [facility] [place] Office: [phone]  Clinic Physician: Edilia Bo  10/11/2012 2:23 PM  VASCULAR QUALITY INITIATIVE FOLLOW UP DATA:  Current smoker: [  ] yes  [ X ] no  Living status: [ X ]  Home  [  ] Nursing home  [  ] Homeless    MEDS:  ASA [ X ] yes  [  ] no- [  ] medical reason  [  ] non compliant  STATIN  [ X ] yes  [  ] no- [  ] medical reason  [  ] non compliant  Beta blocker [  ] yes  Arly.Keller  ] no- Arly.Keller  ] medical reason  [  ] non compliant  ACE inhibitor [  ] yes  Arly.Keller  ] no- [ X ] medical reason  [  ] non compliant  P2Y12 Antagonist Arly.Keller  ] none  [  ] clopidogrel-Plavix  [  ] ticlopidine-Ticlid   [  ] prasugrel-Effient  [  ] ticagrelor- Brilinta    Anticoagulant [ X ] None  [  ] warfarin  [  ] rivaroxaban-Xarelto [  ] dabigatran- Pradaxa  Neurologic event since D/C:  Arly.Keller  ] no  [  ] yes: [  ] eye event  [  ] cortical event  [  ] VB event  [  ] non specific event  [  ] right  [  ] left  [  ] TIA  [  ] stroke  Date:   Modified Rankin Score: 0

## 2012-10-11 NOTE — Patient Instructions (Signed)
Stroke Prevention Some medical conditions and behaviors are associated with an increased chance of having a stroke. You may prevent a stroke by making healthy choices and managing medical conditions. Reduce your risk of having a stroke by:  Staying physically active. Get at least 30 minutes of activity on most or all days.  Not smoking. It may also be helpful to avoid exposure to secondhand smoke.  Limiting alcohol use. Moderate alcohol use is considered to be:  No more than 2 drinks per day for men.  No more than 1 drink per day for nonpregnant women.  Eating healthy foods.  Include 5 or more servings of fruits and vegetables a day.  Certain diets may be prescribed to address high blood pressure, high cholesterol, diabetes, or obesity.  Managing your cholesterol levels.  A low-saturated fat, low-trans fat, low-cholesterol, and high-fiber diet may control cholesterol levels.  Take any prescribed medicines to control cholesterol as directed by your caregiver.  Managing your diabetes.  A controlled-carbohydrate, controlled-sugar diet is recommended to manage diabetes.  Take any prescribed medicines to control diabetes as directed by your caregiver.  Controlling your high blood pressure (hypertension).  A low-salt (sodium), low-saturated fat, low-trans fat, and low-cholesterol diet is recommended to manage high blood pressure.  Take any prescribed medicines to control hypertension as directed by your caregiver.  Maintaining a healthy weight.  A reduced-calorie, low-sodium, low-saturated fat, low-trans fat, low-cholesterol diet is recommended to manage weight.  Stopping drug abuse.  Avoiding birth control pills.  Talk to your caregiver about the risks of taking birth control pills if you are over 35 years old, smoke, get migraines, or have ever had a blood clot.  Getting evaluated for sleep disorders (sleep apnea).  Talk to your caregiver about getting a sleep evaluation  if you snore a lot or have excessive sleepiness.  Taking medicines as directed by your caregiver.  For some people, aspirin or blood thinners (anticoagulants) are helpful in reducing the risk of forming abnormal blood clots that can lead to stroke. If you have the irregular heart rhythm of atrial fibrillation, you should be on a blood thinner unless there is a good reason you cannot take them.  Understand all your medicine instructions. SEEK IMMEDIATE MEDICAL CARE IF:   You have sudden weakness or numbness of the face, arm, or leg, especially on one side of the body.  You have sudden confusion.  You have trouble speaking (aphasia) or understanding.  You have sudden trouble seeing in one or both eyes.  You have sudden trouble walking.  You have dizziness.  You have a loss of balance or coordination.  You have a sudden, severe headache with no known cause.  You have new chest pain or an irregular heartbeat. Any of these symptoms may represent a serious problem that is an emergency. Do not wait to see if the symptoms will go away. Get medical help right away. Call your local emergency services (911 in [place] Do not drive yourself to the hospital. Document Released: 03/04/2004 Document Revised: 04/19/2011 Document Reviewed: 09/14/2010 [employer] Patient Information 2014 [employer].  

## 2012-10-12 ENCOUNTER — Other Ambulatory Visit: Payer: Self-pay | Admitting: *Deleted

## 2012-10-12 DIAGNOSIS — Z48812 Encounter for surgical aftercare following surgery on the circulatory system: Secondary | ICD-10-CM

## 2012-10-24 ENCOUNTER — Encounter: Payer: Self-pay | Admitting: Family

## 2012-12-28 ENCOUNTER — Other Ambulatory Visit: Payer: Self-pay | Admitting: Family Medicine

## 2013-01-01 ENCOUNTER — Telehealth: Payer: Self-pay

## 2013-01-01 NOTE — Telephone Encounter (Signed)
Spoke with pt regarding [employer] project. Pt informed of the need for his LDL to be drawn. Instructed him to call the office to set up an appt for this lab. Note sent to [facility] also.

## 2013-01-02 ENCOUNTER — Telehealth: Payer: Self-pay | Admitting: Family Medicine

## 2013-01-02 DIAGNOSIS — E785 Hyperlipidemia, unspecified: Secondary | ICD-10-CM

## 2013-01-02 NOTE — Telephone Encounter (Signed)
Pt has been sch

## 2013-01-02 NOTE — Telephone Encounter (Signed)
Lab ordered please schedule 

## 2013-01-02 NOTE — Telephone Encounter (Signed)
Pt would like to have ldl drawn, Can I sch?

## 2013-01-03 ENCOUNTER — Other Ambulatory Visit (INDEPENDENT_AMBULATORY_CARE_PROVIDER_SITE_OTHER): Payer: Medicare Other

## 2013-01-03 DIAGNOSIS — E785 Hyperlipidemia, unspecified: Secondary | ICD-10-CM

## 2013-01-03 LAB — LIPID PANEL
HDL: 42 mg/dL (ref >39.00)
VLDL: 20.6 mg/dL (ref 0.0–40.0)

## 2013-01-11 ENCOUNTER — Telehealth: Payer: Self-pay | Admitting: *Deleted

## 2013-01-11 NOTE — Telephone Encounter (Signed)
Per dr todd-all labs were normal and continue present treatment

## 2013-01-11 NOTE — Progress Notes (Signed)
Left message on machine  To call back

## 2013-01-11 NOTE — Progress Notes (Signed)
Pt informed of labs

## 2013-04-02 ENCOUNTER — Other Ambulatory Visit: Payer: Self-pay | Admitting: Family

## 2013-04-02 DIAGNOSIS — Z48812 Encounter for surgical aftercare following surgery on the circulatory system: Secondary | ICD-10-CM

## 2013-04-02 DIAGNOSIS — I6529 Occlusion and stenosis of unspecified carotid artery: Secondary | ICD-10-CM

## 2013-06-13 ENCOUNTER — Other Ambulatory Visit: Payer: Self-pay | Admitting: Family Medicine

## 2013-08-10 IMAGING — CT CT HEAD W/O CM
1 series · 16 of 30 positions shown, 20 images · non-contrast
Comparison: MRI head 07/30/2011

CLINICAL DATA: Stroke.  Left facial numbness

CT HEAD WITHOUT CONTRAST
TECHNIQUE: Contiguous axial images were obtained from the base of
the skull through the vertex without contrast.

[Series 2: head routine 4.8 h37s · axial · 0.41mm/px · z∈[-18,+139]mm · 16 of 36 slices shown, 20 images]
[im 2/36  brain]
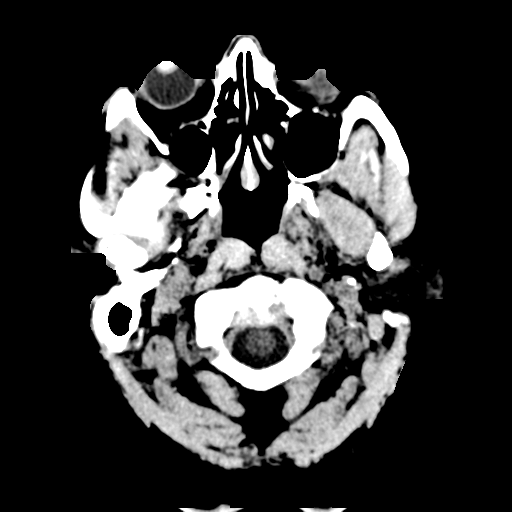
[im 2/36  bone]
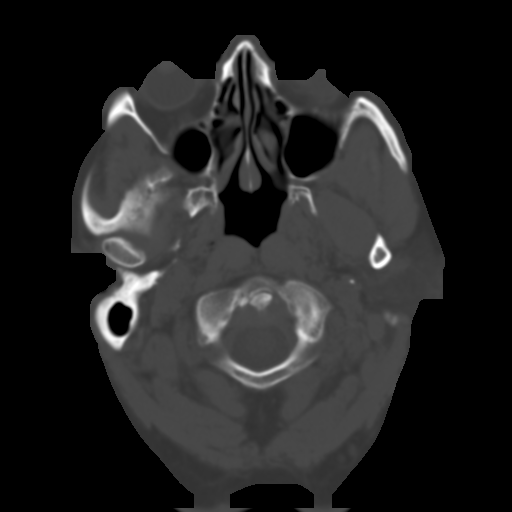
[im 4/36  brain]
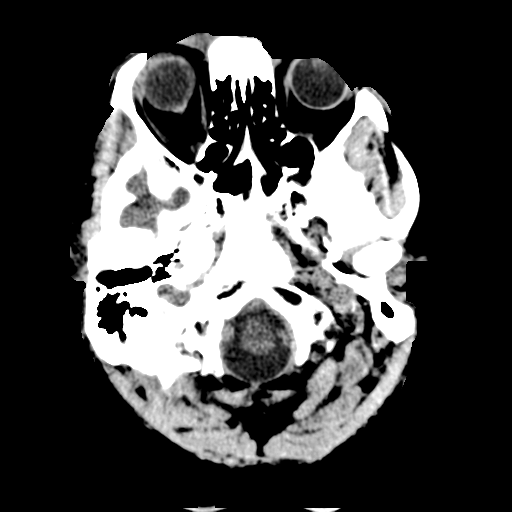
[im 7/36  brain]
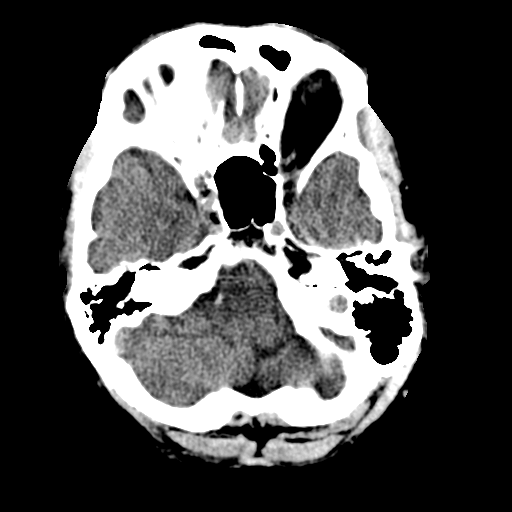
[im 9/36  brain]
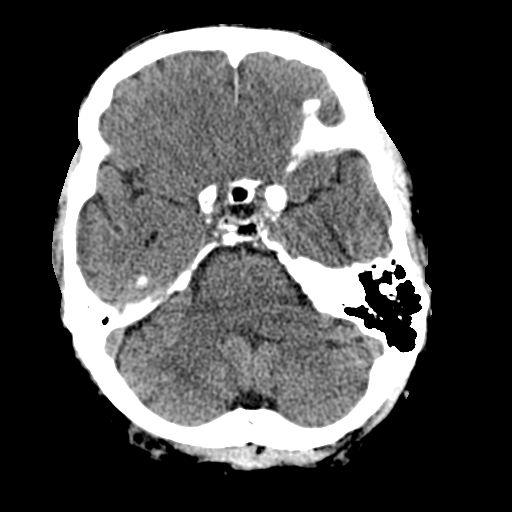
[im 10/36  brain]
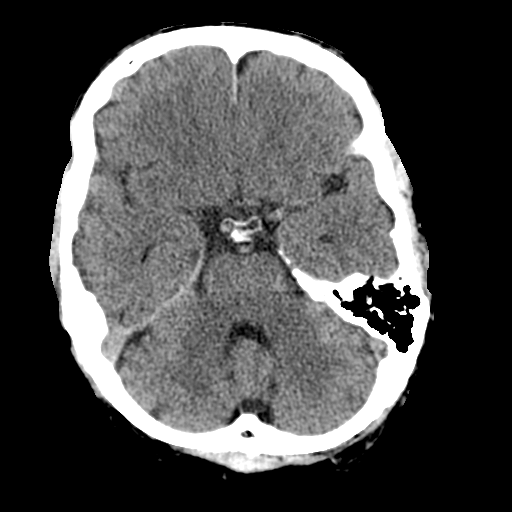
[im 10/36  bone]
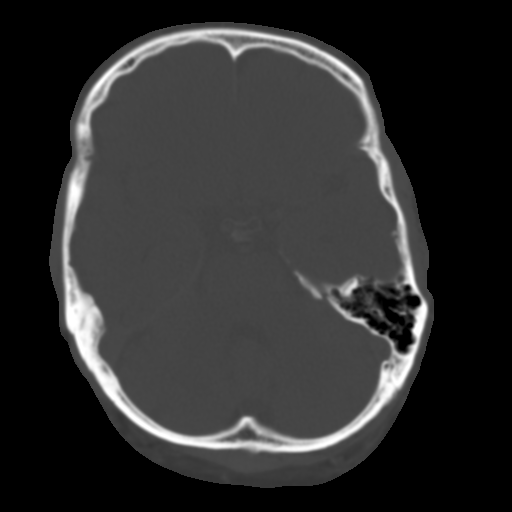
[im 13/36  brain]
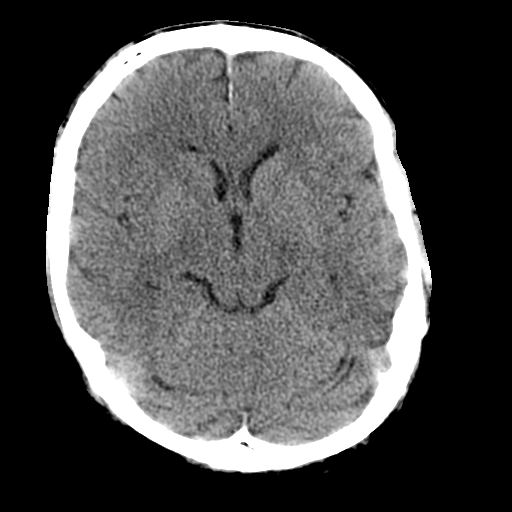
[im 15/36  brain]
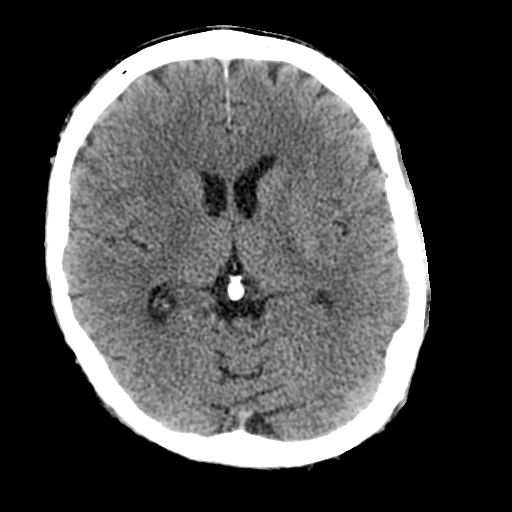
[im 17/36  brain]
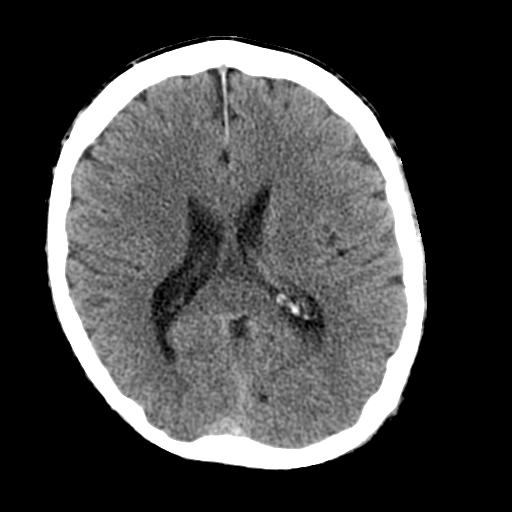
[im 19/36  brain]
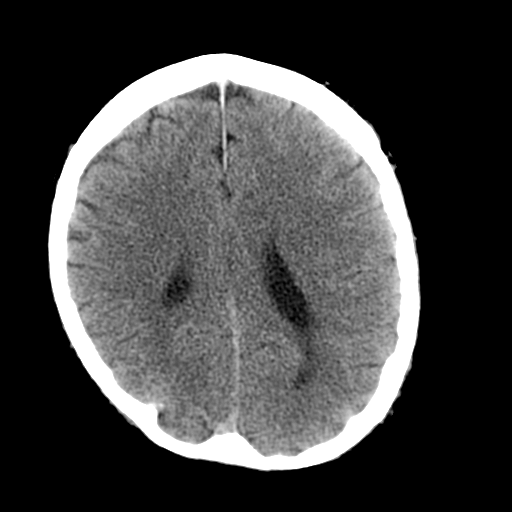
[im 19/36  bone]
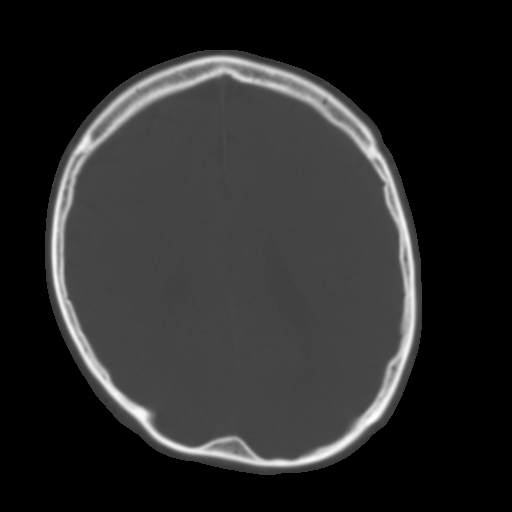
[im 21/36  brain]
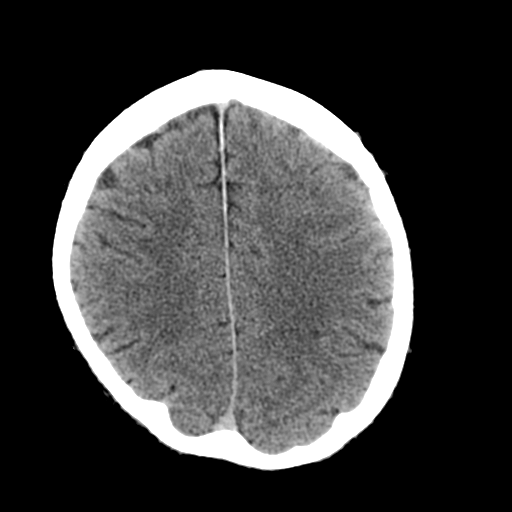
[im 23/36  brain]
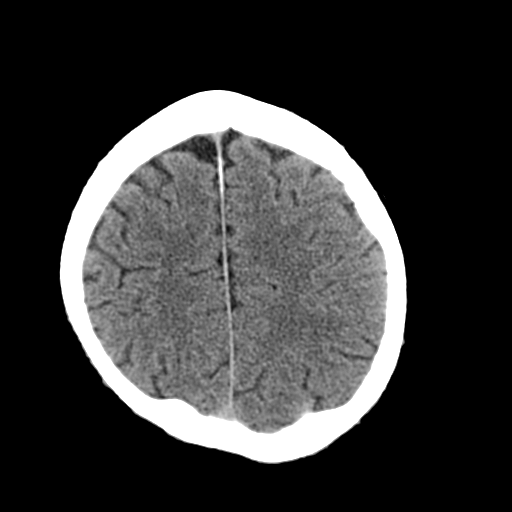
[im 26/36  brain]
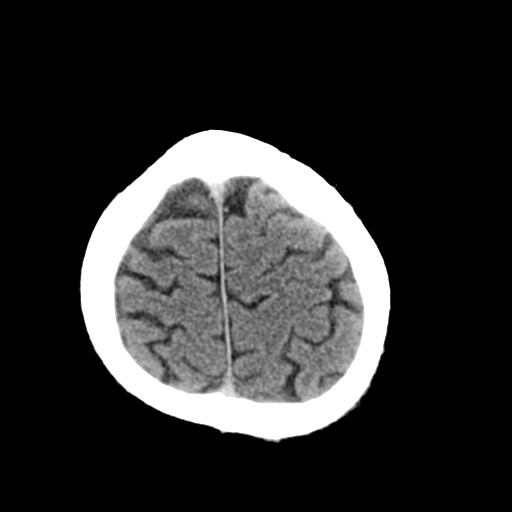
[im 27/36  brain]
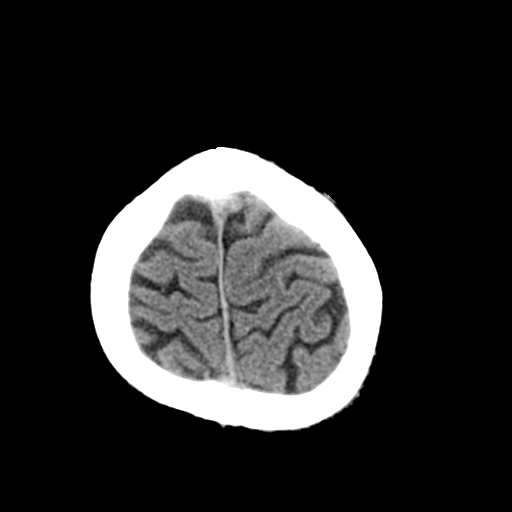
[im 27/36  bone]
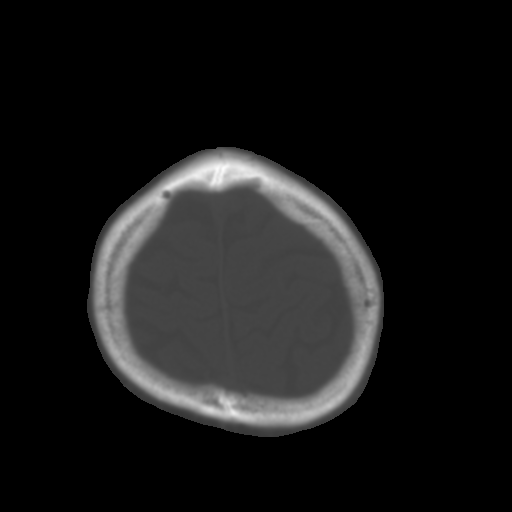
[im 29/36  brain]
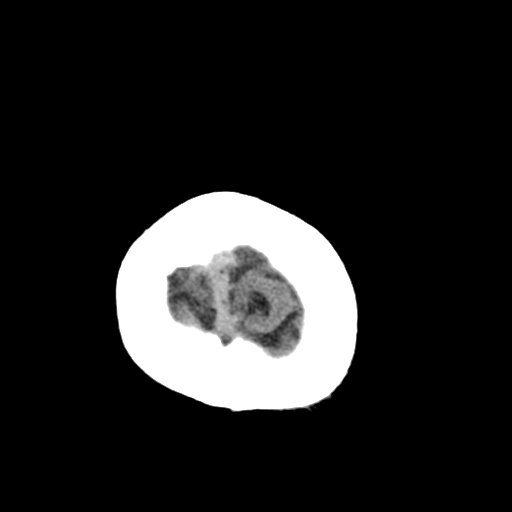
[im 32/36  brain]
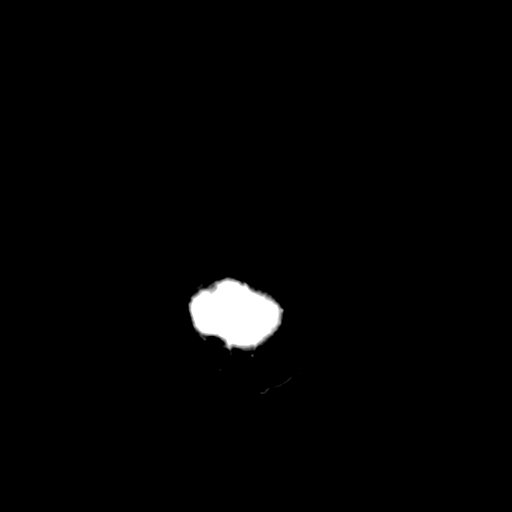
[im 34/36  brain]
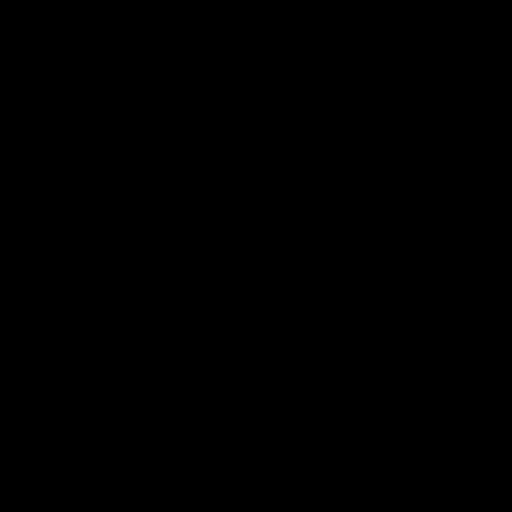

[16 of 30 positions shown; findings below may reference images not displayed]

FINDINGS: Ventricle size is normal.  Negative for intracranial
hemorrhage.  MRI revealed a small area of acute infarction in the
right parietal lobe, this is not seen on CT.  No infarct or mass is
identified.  Calvarium intact.
IMPRESSION: No significant abnormality.  Small acute infarct right parietal
lobe on MRI is not visualized on CT.

## 2013-10-06 ENCOUNTER — Other Ambulatory Visit: Payer: Self-pay | Admitting: Family Medicine

## 2013-10-16 ENCOUNTER — Encounter: Payer: Self-pay | Admitting: Family

## 2013-10-17 ENCOUNTER — Ambulatory Visit (INDEPENDENT_AMBULATORY_CARE_PROVIDER_SITE_OTHER): Payer: Medicare Other | Admitting: Family

## 2013-10-17 ENCOUNTER — Ambulatory Visit (HOSPITAL_COMMUNITY)
Admission: RE | Admit: 2013-10-17 | Discharge: 2013-10-17 | Disposition: A | Discharge status: Home / Self Care | Payer: Medicare Other | Source: Ambulatory Visit | Attending: Family | Admitting: Family

## 2013-10-17 ENCOUNTER — Encounter: Payer: Self-pay | Admitting: Family

## 2013-10-17 VITALS — BP 151/77 | HR 57 | Resp 14 | Ht 74.0 in | Wt 202.0 lb

## 2013-10-17 DIAGNOSIS — Z7982 Long term (current) use of aspirin: Secondary | ICD-10-CM | POA: Diagnosis not present

## 2013-10-17 DIAGNOSIS — Z48812 Encounter for surgical aftercare following surgery on the circulatory system: Secondary | ICD-10-CM

## 2013-10-17 DIAGNOSIS — I6529 Occlusion and stenosis of unspecified carotid artery: Secondary | ICD-10-CM | POA: Diagnosis present

## 2013-10-17 NOTE — Addendum Note (Signed)
Addended by: Mena Goes on: 10/17/2013 03:42 PM   Modules accepted: Orders

## 2013-10-17 NOTE — Progress Notes (Signed)
Established Carotid Patient   History of Present Illness  Eduardo Shaw is a 68 y.o. male patient of Dr. Scot Dock who returns for scheduled carotid arteries surveillance s/p right CEA on 08/03/2011 by Dr. Scot Dock.  Patient has Negative history of TIA or stroke symptom. The patient denies amaurosis fugax or monocular blindness. The patient denies facial drooping.  Pt. denies hemiplegia. The patient denies receptive or expressive aphasia. Pt. denies extremity weakness.  The patient's previous neurologic deficits are Unchanged.  He denies claudication symptoms in his legs with walking. Patient denies New Medical or Surgical History.   Pt Diabetic: No  Pt smoker: non-smoker   Pt meds include:  Statin : Yes  Betablocker: No  ASA: Yes, 325 mg daily.  Other anticoagulants/antiplatelets: none   Past Medical History  Diagnosis Date  . Hypercholesterolemia   . Arthritis of foot   . Skin cancer   . Lactose intolerance   . Carotid artery occlusion   . Stroke June 2013    Mini     Social History History  Substance Use Topics  . Smoking status: Never Smoker   . Smokeless tobacco: Never Used  . Alcohol Use: Yes     Comment: 1-2 drinks on weekends    Family History Family History  Problem Relation Age of Onset  . Lupus Mother   . Stroke Mother   . Lupus Sister     Surgical History Past Surgical History  Procedure Laterality Date  . Mohs surgery      to face  . Endarterectomy  08/03/2011    Procedure: ENDARTERECTOMY CAROTID;  Surgeon: Angelia Mould, MD;  Location: [facility] OR;  Service: Vascular;  Laterality: Right;  . Carotid endarterectomy Right August 03, 2011    CE    Allergies  Allergen Reactions  . Morphine And Related Nausea And Vomiting    Gi upset  . Other     Bee sting    Current Outpatient Prescriptions  Medication Sig Dispense Refill  . atorvastatin (LIPITOR) 40 MG tablet TAKE 1 TABLET BY MOUTH EVERY DAY AT 6PM  90 tablet  0  . Multiple  Vitamins-Minerals (CENTRUM SILVER ADULT 50+ PO) Take by mouth.      Marland Kitchen atorvastatin (LIPITOR) 40 MG tablet TAKE 1 TABLET BY MOUTH EVERY DAY AT 6PM  90 tablet  0  . sildenafil (VIAGRA) 50 MG tablet Take 1 tablet (50 mg total) by mouth as needed for erectile dysfunction.  6 tablet  11   No current facility-administered medications for this visit.    Review of Systems : See HPI for pertinent positives and negatives.  Physical Examination  Filed Vitals:   10/17/13 1215 10/17/13 1217  BP: 145/78 151/77  Pulse: 55 57  Resp:  14  Height:  6\' 2"  (1.88 m)  Weight:  202 lb (91.627 kg)  SpO2:  98%   Body mass index is 25.92 kg/(m^2).  General: WDWN male in NAD  GAIT: normal  Eyes: PERRLA  Pulmonary: CTAB, Negative Rales, Negative rhonchi, & Negative wheezing.  Cardiac: regular Rhythm , Negative Murmurs.   VASCULAR EXAM  Carotid Bruits  Left  Right    Negative  Negative    LE Pulses  LEFT  RIGHT   POPLITEAL  not palpable  not palpable   POSTERIOR TIBIAL  palpable  palpable   DORSALIS PEDIS  ANTERIOR TIBIAL  palpable  palpable   Gastrointestinal: soft, nontender, BS WNL, no r/g, negative masses.  Musculoskeletal: Negative muscle atrophy/wasting. M/S 5/5  throughout, Extremities without ischemic changes.  Neurologic: A&O X 3; Appropriate Affect ; SENSATION ;normal; MOTOR FUNCTION: normal 5/5 strength in all tested muscle groups  Speech is normal  CN 2-12 intact, Pain and light touch intact in extremities, Motor exam as listed above.   Non-Invasive Vascular Imaging CAROTID DUPLEX 10/17/2013   CEREBROVASCULAR DUPLEX EVALUATION    INDICATION: Carotid endarterectomy     PREVIOUS INTERVENTION(S): Right carotid endarterectomy on 08/03/11    DUPLEX EXAM:     RIGHT  LEFT  Peak Systolic Velocities (cm/s) End Diastolic Velocities (cm/s) Plaque LOCATION Peak Systolic Velocities (cm/s) End Diastolic Velocities (cm/s) Plaque  72 10  CCA PROXIMAL 71 14   66 15  CCA MID 65 17   63 12  CCA  DISTAL 54 15   77 7  ECA 149 12 HM  43 13  ICA PROXIMAL 108 33 HM  61 21  ICA MID 88 25   53 18  ICA DISTAL 72 27     Not Calculated ICA / CCA Ratio (PSV) 2.0  Antegrade Vertebral Flow Antegrade  124 Brachial Systolic Pressure (mmHg) 580  Multiphasic (subclavian artery) Brachial Artery Waveforms Multiphasic (subclavian artery)    Plaque Morphology:  HM = Homogeneous, HT = Heterogeneous, CP = Calcific Plaque, SP = Smooth Plaque, IP = Irregular Plaque     ADDITIONAL FINDINGS:   No significant stenosis of the right external or bilateral common carotid arteries.   Left external carotid artery stenosis noted.    IMPRESSION: 1. Patent right carotid endarterectomy site with no right internal carotid artery stenosis. 2. Doppler velocities suggest a less than 40% stenosis of the left proximal internal carotid artery.    Compared to the previous exam:  Velocities of the left proximal internal carotid artery appear less than previously recorded when compared to the previous exam on 10/11/12 with the right internal carotid artery remaining stable.    Assessment: Eduardo Shaw is a 68 y.o. male who presents with asymptomatic patent right carotid endarterectomy site with no right internal carotid artery stenosis and less than 40% stenosis of the left proximal internal carotid artery. Velocities of the left proximal internal carotid artery appear less than previously recorded when compared to the previous exam on 10/11/12 with the right internal carotid artery remaining stable.   Plan: Follow-up in 1 year with Carotid Duplex scan.   I discussed in depth with the patient the nature of atherosclerosis, and emphasized the importance of maximal medical management including strict control of blood pressure, blood glucose, and lipid levels, obtaining regular exercise, and continued cessation of smoking.  The patient is aware that without maximal medical management the underlying atherosclerotic disease  process will progress, limiting the benefit of any interventions. The patient was given information about stroke prevention and what symptoms should prompt the patient to seek immediate medical care. Thank you for allowing [place] to participate in this patient's care.  Clemon Chambers, RN, MSN, FNP-C [facility] [place] Office: [phone]  Clinic Physician: Scot Dock  10/17/2013 12:47 PM

## 2014-02-11 ENCOUNTER — Telehealth: Payer: Self-pay | Admitting: Family Medicine

## 2014-02-11 NOTE — Telephone Encounter (Signed)
Spoke with patient.  He declines a flu vaccine at this time.

## 2014-02-11 NOTE — Telephone Encounter (Signed)
Patient says he is returning a call to [place].

## 2014-02-13 ENCOUNTER — Encounter: Payer: Self-pay | Admitting: Family Medicine

## 2014-02-13 ENCOUNTER — Ambulatory Visit (INDEPENDENT_AMBULATORY_CARE_PROVIDER_SITE_OTHER): Payer: Medicare Other | Admitting: Family Medicine

## 2014-02-13 ENCOUNTER — Ambulatory Visit (INDEPENDENT_AMBULATORY_CARE_PROVIDER_SITE_OTHER)
Admission: RE | Admit: 2014-02-13 | Discharge: 2014-02-13 | Disposition: A | Discharge status: Home / Self Care | Payer: Medicare Other | Source: Ambulatory Visit | Attending: Family Medicine | Admitting: Family Medicine

## 2014-02-13 VITALS — BP 120/80 | Temp 97.3°F | Wt 207.5 lb

## 2014-02-13 DIAGNOSIS — M5116 Intervertebral disc disorders with radiculopathy, lumbar region: Secondary | ICD-10-CM

## 2014-02-13 DIAGNOSIS — M545 Low back pain: Secondary | ICD-10-CM | POA: Diagnosis not present

## 2014-02-13 DIAGNOSIS — M47816 Spondylosis without myelopathy or radiculopathy, lumbar region: Secondary | ICD-10-CM | POA: Diagnosis not present

## 2014-02-13 MED ORDER — CYCLOBENZAPRINE HCL 10 MG PO TABS: 10.0000 mg | ORAL_TABLET | Freq: Three times a day (TID) | ORAL | Status: DC | PRN

## 2014-02-13 MED ORDER — PREDNISONE 20 MG PO TABS: ORAL_TABLET | ORAL | Status: DC

## 2014-02-13 MED ORDER — HYDROCODONE-ACETAMINOPHEN 7.5-300 MG PO TABS: ORAL_TABLET | ORAL | Status: DC

## 2014-02-13 NOTE — Progress Notes (Signed)
Pre visit review using our clinic review tool, if applicable. No additional management support is needed unless otherwise documented below in the visit note. 

## 2014-02-13 NOTE — Progress Notes (Signed)
   Subjective:    Patient ID: Eduardo Shaw, male    DOB: 12-04-1945, 69 y.o.   MRN: [id number]  HPI Eduardo Shaw is a 69 year old married male nonsmoker who comes in today for evaluation of low back pain  A week ago he was lifting one of his children's dogs........ he was dog sitting while his children when on vacation....... and noticed a sudden onset of pain in the right lumbar area. He rates it an 8 on a scale of 1-10. Over the past week the pain has become diffuse. In the last day or 2 years no some numbness and tingling in his left leg that goes down to his left great toe. He's had no bowel or bladder dysfunction. No previous history of back pain or trauma.  He describes the pain now as sharp and dull, constant, varies from a 4 to an 8. It's increased by sitting and decreased by lying down.   Review of Systems    review of systems otherwise negative Objective:   Physical Exam  Well-developed well-nourished male no acute distress he has difficulty assuming the supine position. In the supine position his legs reveal equal length. Sensation muscle strength reflexes all within normal limits. Straight leg raising positive on the left 45      Assessment & Plan:  Lumbar disc disease with radiation down to his left great toe...Marland KitchenMarland KitchenMarland Kitchen consistent with a disc impingement L5-S1,,,,,,, no neurologic deficit,,,,,,,,, treat symptomatically begin physical therapy follow-up in one week

## 2014-02-13 NOTE — Patient Instructions (Signed)
Flexeril 10 mg..........Marland Kitchen 1/2-1 tablet 3 times daily  Vicodin ES..........Marland Kitchen 1/2-1 tablet 3 times daily  Complete bed rest today Thursday and Friday........... Saturday walk........ lie down...Marland KitchenMarland KitchenMarland Kitchen no sitting  We will get you set up for physical therapy ASAP  Prednisone 20 mg......... 2 tabs 3 days then taper as outlined  Follow-up in one week  Go to the main office now for x-rays of your spine

## 2014-02-14 ENCOUNTER — Ambulatory Visit: Payer: Medicare Other | Attending: Family Medicine

## 2014-02-14 DIAGNOSIS — M79662 Pain in left lower leg: Secondary | ICD-10-CM | POA: Diagnosis not present

## 2014-02-14 DIAGNOSIS — M5116 Intervertebral disc disorders with radiculopathy, lumbar region: Secondary | ICD-10-CM | POA: Insufficient documentation

## 2014-02-14 DIAGNOSIS — M25659 Stiffness of unspecified hip, not elsewhere classified: Secondary | ICD-10-CM | POA: Insufficient documentation

## 2014-02-14 DIAGNOSIS — M545 Low back pain: Secondary | ICD-10-CM | POA: Insufficient documentation

## 2014-02-15 ENCOUNTER — Other Ambulatory Visit: Payer: Self-pay | Admitting: Family Medicine

## 2014-02-15 ENCOUNTER — Ambulatory Visit: Payer: Medicare Other

## 2014-02-15 DIAGNOSIS — M545 Low back pain: Secondary | ICD-10-CM

## 2014-02-18 ENCOUNTER — Ambulatory Visit: Payer: Medicare Other | Admitting: Physical Therapy

## 2014-02-18 DIAGNOSIS — M545 Low back pain: Secondary | ICD-10-CM | POA: Diagnosis not present

## 2014-02-18 DIAGNOSIS — M5116 Intervertebral disc disorders with radiculopathy, lumbar region: Secondary | ICD-10-CM | POA: Diagnosis not present

## 2014-02-18 DIAGNOSIS — M25659 Stiffness of unspecified hip, not elsewhere classified: Secondary | ICD-10-CM | POA: Diagnosis not present

## 2014-02-18 DIAGNOSIS — M79662 Pain in left lower leg: Secondary | ICD-10-CM | POA: Diagnosis not present

## 2014-02-21 ENCOUNTER — Ambulatory Visit: Payer: Medicare Other | Admitting: Family Medicine

## 2014-02-21 DIAGNOSIS — M549 Dorsalgia, unspecified: Secondary | ICD-10-CM | POA: Diagnosis not present

## 2014-02-21 DIAGNOSIS — M4726 Other spondylosis with radiculopathy, lumbar region: Secondary | ICD-10-CM | POA: Diagnosis not present

## 2014-02-21 DIAGNOSIS — M546 Pain in thoracic spine: Secondary | ICD-10-CM | POA: Diagnosis not present

## 2014-02-21 DIAGNOSIS — M545 Low back pain: Secondary | ICD-10-CM | POA: Diagnosis not present

## 2014-02-21 DIAGNOSIS — M21371 Foot drop, right foot: Secondary | ICD-10-CM | POA: Diagnosis not present

## 2014-02-21 DIAGNOSIS — M5136 Other intervertebral disc degeneration, lumbar region: Secondary | ICD-10-CM | POA: Diagnosis not present

## 2014-02-22 ENCOUNTER — Encounter (HOSPITAL_COMMUNITY): Payer: Self-pay | Admitting: *Deleted

## 2014-02-22 DIAGNOSIS — M21371 Foot drop, right foot: Secondary | ICD-10-CM | POA: Diagnosis not present

## 2014-02-22 DIAGNOSIS — M545 Low back pain: Secondary | ICD-10-CM | POA: Diagnosis not present

## 2014-02-22 DIAGNOSIS — M5136 Other intervertebral disc degeneration, lumbar region: Secondary | ICD-10-CM | POA: Diagnosis not present

## 2014-02-22 DIAGNOSIS — M5416 Radiculopathy, lumbar region: Secondary | ICD-10-CM | POA: Diagnosis not present

## 2014-02-22 DIAGNOSIS — M5126 Other intervertebral disc displacement, lumbar region: Secondary | ICD-10-CM | POA: Diagnosis not present

## 2014-02-22 NOTE — Progress Notes (Signed)
Anesthesia Chart Review: SAME DAY WORK-UP.  Patient is 69 year old male added on for OR Saturday for bilateral L3-4 laminotomy with left L3-4 microdiskectomy and possible right L3-4 microdiskectomy and right L4-5 laminotomy and foraminotomy by Dr. Sherwood Gambler.  History includes non-smoker, hypercholesterolemia, CVA due to symptomatic right carotid occlusive disease s/p right CEA 08/03/11 (Dr. Scot Dock). Following recovery of surgery, his PCP Dr. Stevie Kern recommended routine cardiac testing due to his CAD risk factors (see below).    He underwent a nuclear stress test on 11/18/11 that showed: Overall Impression: Low risk stress nuclear study. Small, fixed mild apical perfusion defect. This most likely represents apical thinning. There is mild global hypokinesis. Would consider echocardiogram to confirm LV systolic function.  LV Ejection Fraction: 49%. LV Wall Motion: Mild global hypokinesis.  Subsequently echo was done on 11/25/11 to re-evaluate his EF, and showed normal EF and wall motion so no specific cardiology follow-up was recommended.  11/25/11 Echo: - Left ventricle: No apical wall motion abnormality The cavity size was normal. Systolic function was normal. The estimated ejection fraction was in the range of 55% to 60%. Wall motion was normal; there were no regional wall motion abnormalities. - Mitral valve: Mild regurgitation. - Atrial septum: No defect or patent foramen ovale was identified.  10/17/13 Carotid duplex: Patent right CEA site with no RICA stenosis. < 40% left proximal ICA stenosis.  He will need an EKG and labs on arrival.  These will be reviewed by his anesthesiologist.  If these are stable and no CV symptoms then I would anticipate that he could proceed as planned.  George Hugh [facility] Short Stay Center/Anesthesiology Phone [phone] 02/22/2014 12:10 PM

## 2014-02-22 NOTE — Progress Notes (Signed)
Pt denies SOB, chest pain, and being under the care of a cardiologist. Pt denies having an EKG and chest x ray within the last year and denies ever having a cardiac cath.

## 2014-02-23 ENCOUNTER — Inpatient Hospital Stay (HOSPITAL_COMMUNITY)
Admission: RE | Admit: 2014-02-23 | Discharge: 2014-02-24 | DRG: 520 | Disposition: A | Discharge status: Home / Self Care | Payer: Medicare Other | Source: Ambulatory Visit | Attending: Neurosurgery | Admitting: Neurosurgery

## 2014-02-23 ENCOUNTER — Encounter (HOSPITAL_COMMUNITY)
Admission: RE | Disposition: A | Discharge status: Home / Self Care | Payer: Self-pay | Source: Ambulatory Visit | Attending: Neurosurgery

## 2014-02-23 ENCOUNTER — Encounter (HOSPITAL_COMMUNITY): Payer: Self-pay | Admitting: Certified Registered"

## 2014-02-23 ENCOUNTER — Inpatient Hospital Stay (HOSPITAL_COMMUNITY): Payer: Medicare Other | Admitting: Anesthesiology

## 2014-02-23 ENCOUNTER — Inpatient Hospital Stay (HOSPITAL_COMMUNITY): Payer: Medicare Other

## 2014-02-23 DIAGNOSIS — Z8673 Personal history of transient ischemic attack (TIA), and cerebral infarction without residual deficits: Secondary | ICD-10-CM | POA: Diagnosis not present

## 2014-02-23 DIAGNOSIS — Z885 Allergy status to narcotic agent status: Secondary | ICD-10-CM

## 2014-02-23 DIAGNOSIS — E739 Lactose intolerance, unspecified: Secondary | ICD-10-CM | POA: Diagnosis not present

## 2014-02-23 DIAGNOSIS — Z91030 Bee allergy status: Secondary | ICD-10-CM | POA: Diagnosis not present

## 2014-02-23 DIAGNOSIS — M4806 Spinal stenosis, lumbar region: Secondary | ICD-10-CM | POA: Diagnosis not present

## 2014-02-23 DIAGNOSIS — E780 Pure hypercholesterolemia: Secondary | ICD-10-CM | POA: Diagnosis present

## 2014-02-23 DIAGNOSIS — M8448XA Pathological fracture, other site, initial encounter for fracture: Secondary | ICD-10-CM | POA: Diagnosis not present

## 2014-02-23 DIAGNOSIS — Z7982 Long term (current) use of aspirin: Secondary | ICD-10-CM

## 2014-02-23 DIAGNOSIS — M47896 Other spondylosis, lumbar region: Secondary | ICD-10-CM | POA: Diagnosis present

## 2014-02-23 DIAGNOSIS — I6521 Occlusion and stenosis of right carotid artery: Secondary | ICD-10-CM | POA: Diagnosis not present

## 2014-02-23 DIAGNOSIS — M5416 Radiculopathy, lumbar region: Secondary | ICD-10-CM

## 2014-02-23 DIAGNOSIS — M5126 Other intervertebral disc displacement, lumbar region: Secondary | ICD-10-CM | POA: Diagnosis present

## 2014-02-23 DIAGNOSIS — M21371 Foot drop, right foot: Secondary | ICD-10-CM | POA: Diagnosis not present

## 2014-02-23 DIAGNOSIS — Z7952 Long term (current) use of systemic steroids: Secondary | ICD-10-CM | POA: Diagnosis not present

## 2014-02-23 DIAGNOSIS — N529 Male erectile dysfunction, unspecified: Secondary | ICD-10-CM | POA: Diagnosis present

## 2014-02-23 DIAGNOSIS — E785 Hyperlipidemia, unspecified: Secondary | ICD-10-CM | POA: Diagnosis present

## 2014-02-23 DIAGNOSIS — M5116 Intervertebral disc disorders with radiculopathy, lumbar region: Secondary | ICD-10-CM | POA: Diagnosis not present

## 2014-02-23 DIAGNOSIS — M21372 Foot drop, left foot: Secondary | ICD-10-CM | POA: Diagnosis present

## 2014-02-23 DIAGNOSIS — Z85828 Personal history of other malignant neoplasm of skin: Secondary | ICD-10-CM

## 2014-02-23 DIAGNOSIS — D869 Sarcoidosis, unspecified: Secondary | ICD-10-CM | POA: Diagnosis not present

## 2014-02-23 DIAGNOSIS — Z823 Family history of stroke: Secondary | ICD-10-CM | POA: Diagnosis not present

## 2014-02-23 HISTORY — PX: LUMBAR LAMINECTOMY/DECOMPRESSION MICRODISCECTOMY: SHX5026

## 2014-02-23 LAB — COMPREHENSIVE METABOLIC PANEL
ALT: 63 U/L — ABNORMAL HIGH (ref 0–53)
AST: 33 U/L (ref 0–37)
Albumin: 3.8 g/dL (ref 3.5–5.2)
Alkaline Phosphatase: 73 U/L (ref 39–117)
Anion gap: 7 (ref 5–15)
BUN: 33 mg/dL — ABNORMAL HIGH (ref 6–23)
CO2: 28 mmol/L (ref 19–32)
Calcium: 9.1 mg/dL (ref 8.4–10.5)
Chloride: 101 mEq/L (ref 96–112)
Creatinine, Ser: 1.36 mg/dL — ABNORMAL HIGH (ref 0.50–1.35)
GFR calc Af Amer: 60 mL/min — ABNORMAL LOW (ref >90)
GFR calc non Af Amer: 52 mL/min — ABNORMAL LOW (ref >90)
Glucose, Bld: 102 mg/dL — ABNORMAL HIGH (ref 70–99)
Potassium: 4.7 mmol/L (ref 3.5–5.1)
Sodium: 136 mmol/L (ref 135–145)
Total Bilirubin: 1 mg/dL (ref 0.3–1.2)
Total Protein: 7 g/dL (ref 6.0–8.3)

## 2014-02-23 LAB — SURGICAL PCR SCREEN
MRSA, PCR: NEGATIVE
Staphylococcus aureus: POSITIVE — AB

## 2014-02-23 SURGERY — LUMBAR LAMINECTOMY/DECOMPRESSION MICRODISCECTOMY 2 LEVELS
Anesthesia: General | Site: Spine Lumbar | Laterality: Bilateral

## 2014-02-23 MED ORDER — CEFAZOLIN SODIUM-DEXTROSE 2-3 GM-% IV SOLR
INTRAVENOUS | Status: AC
  Administered 2014-02-23: 2 g via INTRAVENOUS
  Filled 2014-02-23: qty 50

## 2014-02-23 MED ORDER — KETOROLAC TROMETHAMINE 30 MG/ML IJ SOLN
30.0000 mg | Freq: Four times a day (QID) | INTRAMUSCULAR | Status: DC
  Administered 2014-02-23 – 2014-02-24 (×3): 30 mg via INTRAVENOUS
  Filled 2014-02-23 (×3): qty 1

## 2014-02-23 MED ORDER — ONDANSETRON HCL 4 MG/2ML IJ SOLN: 4.0000 mg | Freq: Four times a day (QID) | INTRAMUSCULAR | Status: DC | PRN

## 2014-02-23 MED ORDER — MIDAZOLAM HCL 5 MG/5ML IJ SOLN
INTRAMUSCULAR | Status: DC | PRN
  Administered 2014-02-23: 2 mg via INTRAVENOUS

## 2014-02-23 MED ORDER — PROPOFOL 10 MG/ML IV BOLUS
INTRAVENOUS | Status: AC
  Filled 2014-02-23: qty 20

## 2014-02-23 MED ORDER — MUPIROCIN 2 % EX OINT
TOPICAL_OINTMENT | CUTANEOUS | Status: AC
  Filled 2014-02-23: qty 22

## 2014-02-23 MED ORDER — MUPIROCIN 2 % EX OINT
TOPICAL_OINTMENT | Freq: Once | CUTANEOUS | Status: AC
  Administered 2014-02-23: 1 via NASAL
  Filled 2014-02-23: qty 22

## 2014-02-23 MED ORDER — DEXAMETHASONE SODIUM PHOSPHATE 10 MG/ML IJ SOLN
INTRAMUSCULAR | Status: DC | PRN
  Administered 2014-02-23: 10 mg via INTRAVENOUS

## 2014-02-23 MED ORDER — LACTATED RINGERS IV SOLN
INTRAVENOUS | Status: DC
  Administered 2014-02-23 (×2): via INTRAVENOUS

## 2014-02-23 MED ORDER — MIDAZOLAM HCL 2 MG/2ML IJ SOLN
INTRAMUSCULAR | Status: AC
  Filled 2014-02-23: qty 2

## 2014-02-23 MED ORDER — OXYCODONE-ACETAMINOPHEN 5-325 MG PO TABS
1.0000 | ORAL_TABLET | ORAL | Status: DC | PRN
  Administered 2014-02-23: 2 via ORAL
  Filled 2014-02-23: qty 2

## 2014-02-23 MED ORDER — METHYLPREDNISOLONE ACETATE 40 MG/ML IJ SUSP
INTRAMUSCULAR | Status: DC | PRN
  Administered 2014-02-23: 40 mg

## 2014-02-23 MED ORDER — ACETAMINOPHEN 10 MG/ML IV SOLN
INTRAVENOUS | Status: AC
  Administered 2014-02-23: 1000 mg via INTRAVENOUS
  Filled 2014-02-23: qty 100

## 2014-02-23 MED ORDER — ROCURONIUM BROMIDE 50 MG/5ML IV SOLN
INTRAVENOUS | Status: AC
  Filled 2014-02-23: qty 1

## 2014-02-23 MED ORDER — CYCLOBENZAPRINE HCL 10 MG PO TABS: 10.0000 mg | ORAL_TABLET | Freq: Three times a day (TID) | ORAL | Status: DC | PRN

## 2014-02-23 MED ORDER — MENTHOL 3 MG MT LOZG: 1.0000 | LOZENGE | OROMUCOSAL | Status: DC | PRN

## 2014-02-23 MED ORDER — ACETAMINOPHEN 325 MG PO TABS: 650.0000 mg | ORAL_TABLET | ORAL | Status: DC | PRN

## 2014-02-23 MED ORDER — LIDOCAINE HCL (CARDIAC) 20 MG/ML IV SOLN
INTRAVENOUS | Status: AC
  Filled 2014-02-23: qty 5

## 2014-02-23 MED ORDER — SODIUM CHLORIDE 0.9 % IJ SOLN: 3.0000 mL | INTRAMUSCULAR | Status: DC | PRN

## 2014-02-23 MED ORDER — EPHEDRINE SULFATE 50 MG/ML IJ SOLN
INTRAMUSCULAR | Status: DC | PRN
  Administered 2014-02-23 (×2): 10 mg via INTRAVENOUS

## 2014-02-23 MED ORDER — 0.9 % SODIUM CHLORIDE (POUR BTL) OPTIME
TOPICAL | Status: DC | PRN
  Administered 2014-02-23: 1000 mL

## 2014-02-23 MED ORDER — FENTANYL CITRATE 0.05 MG/ML IJ SOLN
INTRAMUSCULAR | Status: AC
  Filled 2014-02-23: qty 2

## 2014-02-23 MED ORDER — METHYLPREDNISOLONE ACETATE 40 MG/ML IJ SUSP
INTRAMUSCULAR | Status: AC
  Filled 2014-02-23: qty 1

## 2014-02-23 MED ORDER — SUFENTANIL CITRATE 50 MCG/ML IV SOLN
INTRAVENOUS | Status: DC | PRN
  Administered 2014-02-23: 10 ug via INTRAVENOUS
  Administered 2014-02-23: 30 ug via INTRAVENOUS
  Administered 2014-02-23: 10 ug via INTRAVENOUS

## 2014-02-23 MED ORDER — SODIUM CHLORIDE 0.9 % IR SOLN
Status: DC | PRN
  Administered 2014-02-23: 10:00:00

## 2014-02-23 MED ORDER — HYDROCODONE-ACETAMINOPHEN 5-325 MG PO TABS: 1.0000 | ORAL_TABLET | ORAL | Status: DC | PRN

## 2014-02-23 MED ORDER — KETOROLAC TROMETHAMINE 30 MG/ML IJ SOLN
INTRAMUSCULAR | Status: AC
  Filled 2014-02-23: qty 1

## 2014-02-23 MED ORDER — FENTANYL CITRATE 0.05 MG/ML IJ SOLN
INTRAMUSCULAR | Status: DC | PRN
  Administered 2014-02-23: 100 ug via INTRAVENOUS

## 2014-02-23 MED ORDER — SODIUM CHLORIDE 0.9 % IV SOLN: 250.0000 mL | INTRAVENOUS | Status: DC

## 2014-02-23 MED ORDER — ALBUMIN HUMAN 5 % IV SOLN
INTRAVENOUS | Status: DC | PRN
  Administered 2014-02-23: 10:00:00 via INTRAVENOUS

## 2014-02-23 MED ORDER — FENTANYL CITRATE 0.05 MG/ML IJ SOLN: 25.0000 ug | INTRAMUSCULAR | Status: DC | PRN

## 2014-02-23 MED ORDER — ZOLPIDEM TARTRATE 5 MG PO TABS: 5.0000 mg | ORAL_TABLET | Freq: Every evening | ORAL | Status: DC | PRN

## 2014-02-23 MED ORDER — SODIUM CHLORIDE 0.9 % IJ SOLN
3.0000 mL | Freq: Two times a day (BID) | INTRAMUSCULAR | Status: DC
  Administered 2014-02-23: 3 mL via INTRAVENOUS

## 2014-02-23 MED ORDER — KETOROLAC TROMETHAMINE 30 MG/ML IJ SOLN
30.0000 mg | Freq: Once | INTRAMUSCULAR | Status: AC
Start: 2014-02-23 — End: 2014-02-23
  Administered 2014-02-23: 30 mg via INTRAVENOUS

## 2014-02-23 MED ORDER — SUCCINYLCHOLINE CHLORIDE 20 MG/ML IJ SOLN
INTRAMUSCULAR | Status: AC
  Filled 2014-02-23: qty 1

## 2014-02-23 MED ORDER — BUPIVACAINE HCL (PF) 0.5 % IJ SOLN
INTRAMUSCULAR | Status: DC | PRN
  Administered 2014-02-23: 13 mL

## 2014-02-23 MED ORDER — LIDOCAINE HCL (CARDIAC) 20 MG/ML IV SOLN
INTRAVENOUS | Status: DC | PRN
  Administered 2014-02-23: 100 mg via INTRAVENOUS

## 2014-02-23 MED ORDER — ACETAMINOPHEN 650 MG RE SUPP
650.0000 mg | RECTAL | Status: DC | PRN
Start: 2014-02-23 — End: 2014-02-24

## 2014-02-23 MED ORDER — HYDROMORPHONE HCL 1 MG/ML IJ SOLN: 1.0000 mg | INTRAMUSCULAR | Status: DC | PRN

## 2014-02-23 MED ORDER — LIDOCAINE-EPINEPHRINE 1 %-1:100000 IJ SOLN
INTRAMUSCULAR | Status: DC | PRN
  Administered 2014-02-23: 13 mL

## 2014-02-23 MED ORDER — SODIUM CHLORIDE 0.9 % IV SOLN
INTRAVENOUS | Status: DC
  Administered 2014-02-23: 13:00:00 via INTRAVENOUS

## 2014-02-23 MED ORDER — HYDROXYZINE HCL 25 MG PO TABS: 50.0000 mg | ORAL_TABLET | ORAL | Status: DC | PRN

## 2014-02-23 MED ORDER — SODIUM CHLORIDE 0.9 % IJ SOLN
INTRAMUSCULAR | Status: AC
  Filled 2014-02-23: qty 10

## 2014-02-23 MED ORDER — MAGNESIUM HYDROXIDE 400 MG/5ML PO SUSP: 30.0000 mL | Freq: Every day | ORAL | Status: DC | PRN

## 2014-02-23 MED ORDER — ONDANSETRON HCL 4 MG/2ML IJ SOLN
INTRAMUSCULAR | Status: AC
  Filled 2014-02-23: qty 2

## 2014-02-23 MED ORDER — THROMBIN 5000 UNITS EX SOLR
CUTANEOUS | Status: DC | PRN
  Administered 2014-02-23 (×3): 5000 [IU] via TOPICAL

## 2014-02-23 MED ORDER — HYDROMORPHONE HCL 2 MG PO TABS: 1.0000 mg | ORAL_TABLET | ORAL | Status: DC | PRN

## 2014-02-23 MED ORDER — THROMBIN 5000 UNITS EX SOLR
CUTANEOUS | Status: DC | PRN
  Administered 2014-02-23: 11:00:00 via TOPICAL

## 2014-02-23 MED ORDER — ONDANSETRON HCL 4 MG/2ML IJ SOLN
INTRAMUSCULAR | Status: DC | PRN
  Administered 2014-02-23: 4 mg via INTRAVENOUS

## 2014-02-23 MED ORDER — HEMOSTATIC AGENTS (NO CHARGE) OPTIME
TOPICAL | Status: DC | PRN
  Administered 2014-02-23: 1 via TOPICAL

## 2014-02-23 MED ORDER — PHENOL 1.4 % MT LIQD: 1.0000 | OROMUCOSAL | Status: DC | PRN

## 2014-02-23 MED ORDER — ATORVASTATIN CALCIUM 40 MG PO TABS
40.0000 mg | ORAL_TABLET | Freq: Every day | ORAL | Status: DC
  Administered 2014-02-23: 40 mg via ORAL
  Filled 2014-02-23: qty 1

## 2014-02-23 MED ORDER — SUFENTANIL CITRATE 50 MCG/ML IV SOLN
INTRAVENOUS | Status: AC
  Filled 2014-02-23: qty 1

## 2014-02-23 MED ORDER — BISACODYL 10 MG RE SUPP: 10.0000 mg | Freq: Every day | RECTAL | Status: DC | PRN

## 2014-02-23 MED ORDER — ALUM & MAG HYDROXIDE-SIMETH 200-200-20 MG/5ML PO SUSP: 30.0000 mL | Freq: Four times a day (QID) | ORAL | Status: DC | PRN

## 2014-02-23 MED ORDER — PROPOFOL 10 MG/ML IV BOLUS
INTRAVENOUS | Status: DC | PRN
  Administered 2014-02-23: 160 mg via INTRAVENOUS

## 2014-02-23 MED ORDER — ROCURONIUM BROMIDE 100 MG/10ML IV SOLN
INTRAVENOUS | Status: DC | PRN
  Administered 2014-02-23: 50 mg via INTRAVENOUS

## 2014-02-23 MED ORDER — PHENYLEPHRINE HCL 10 MG/ML IJ SOLN
INTRAMUSCULAR | Status: DC | PRN
  Administered 2014-02-23: 80 ug via INTRAVENOUS

## 2014-02-23 SURGICAL SUPPLY — 51 items
BAG DECANTER FOR FLEXI CONT (MISCELLANEOUS) ×2 IMPLANT
BENZOIN TINCTURE PRP APPL 2/3 (GAUZE/BANDAGES/DRESSINGS) IMPLANT
BLADE SURG ROTATE 9660 (MISCELLANEOUS) IMPLANT
BRUSH SCRUB EZ PLAIN DRY (MISCELLANEOUS) ×2 IMPLANT
BUR ACORN 6.0 ACORN (BURR) ×2 IMPLANT
BUR MATCHSTICK NEURO 3.0 LAGG (BURR) ×2 IMPLANT
CANISTER SUCT 3000ML (MISCELLANEOUS) ×2 IMPLANT
CONT SPEC 4OZ CLIKSEAL STRL BL (MISCELLANEOUS) ×2 IMPLANT
COVER LIGHT HANDLE STERIS (MISCELLANEOUS) ×2 IMPLANT
DERMABOND ADVANCED (GAUZE/BANDAGES/DRESSINGS) ×2
DERMABOND ADVANCED .7 DNX12 (GAUZE/BANDAGES/DRESSINGS) ×2 IMPLANT
DRAPE LAPAROTOMY 100X72X124 (DRAPES) ×2 IMPLANT
DRAPE MICROSCOPE LEICA (MISCELLANEOUS) ×2 IMPLANT
DRAPE POUCH INSTRU U-SHP 10X18 (DRAPES) ×2 IMPLANT
DRSG EMULSION OIL 3X3 NADH (GAUZE/BANDAGES/DRESSINGS) IMPLANT
ELECT REM PT RETURN 9FT ADLT (ELECTROSURGICAL) ×2
ELECTRODE REM PT RTRN 9FT ADLT (ELECTROSURGICAL) ×1 IMPLANT
GAUZE SPONGE 4X4 12PLY STRL (GAUZE/BANDAGES/DRESSINGS) IMPLANT
GAUZE SPONGE 4X4 16PLY XRAY LF (GAUZE/BANDAGES/DRESSINGS) IMPLANT
GLOVE BIOGEL PI IND STRL 8 (GLOVE) ×4 IMPLANT
GLOVE BIOGEL PI INDICATOR 8 (GLOVE) ×4
GLOVE ECLIPSE 7.5 STRL STRAW (GLOVE) ×6 IMPLANT
GOWN STRL REUS W/ TWL XL LVL3 (GOWN DISPOSABLE) ×1 IMPLANT
GOWN STRL REUS W/TWL 2XL LVL3 (GOWN DISPOSABLE) ×2 IMPLANT
GOWN STRL REUS W/TWL XL LVL3 (GOWN DISPOSABLE) ×1
HEMOSTAT POWDER SURGIFOAM 1G (HEMOSTASIS) ×2 IMPLANT
KIT BASIN OR (CUSTOM PROCEDURE TRAY) ×2 IMPLANT
KIT ROOM TURNOVER OR (KITS) ×2 IMPLANT
NEEDLE HYPO 18GX1.5 BLUNT FILL (NEEDLE) ×2 IMPLANT
NEEDLE SPNL 18GX3.5 QUINCKE PK (NEEDLE) ×2 IMPLANT
NEEDLE SPNL 22GX3.5 QUINCKE BK (NEEDLE) ×2 IMPLANT
NS IRRIG 1000ML POUR BTL (IV SOLUTION) ×2 IMPLANT
PACK LAMINECTOMY NEURO (CUSTOM PROCEDURE TRAY) ×2 IMPLANT
PAD ARMBOARD 7.5X6 YLW CONV (MISCELLANEOUS) ×6 IMPLANT
PATTIES SURGICAL .5 X1 (DISPOSABLE) ×2 IMPLANT
RUBBERBAND STERILE (MISCELLANEOUS) ×4 IMPLANT
SPONGE GAUZE 4X4 12PLY STER LF (GAUZE/BANDAGES/DRESSINGS) ×2 IMPLANT
SPONGE LAP 4X18 X RAY DECT (DISPOSABLE) IMPLANT
SPONGE SURGIFOAM ABS GEL SZ50 (HEMOSTASIS) ×2 IMPLANT
STRIP CLOSURE SKIN 1/2X4 (GAUZE/BANDAGES/DRESSINGS) IMPLANT
SUT PROLENE 6 0 BV (SUTURE) IMPLANT
SUT VIC AB 1 CT1 18XBRD ANBCTR (SUTURE) ×1 IMPLANT
SUT VIC AB 1 CT1 8-18 (SUTURE) ×1
SUT VIC AB 2-0 CP2 18 (SUTURE) ×2 IMPLANT
SUT VIC AB 3-0 SH 8-18 (SUTURE) IMPLANT
SYR 20ML ECCENTRIC (SYRINGE) ×2 IMPLANT
SYR 5ML LL (SYRINGE) ×2 IMPLANT
TAPE CLOTH SURG 4X10 WHT LF (GAUZE/BANDAGES/DRESSINGS) ×2 IMPLANT
TOWEL OR 17X24 6PK STRL BLUE (TOWEL DISPOSABLE) ×2 IMPLANT
TOWEL OR 17X26 10 PK STRL BLUE (TOWEL DISPOSABLE) ×2 IMPLANT
WATER STERILE IRR 1000ML POUR (IV SOLUTION) ×2 IMPLANT

## 2014-02-23 NOTE — Progress Notes (Signed)
Filed Vitals:   02/23/14 1303 02/23/14 1318 02/23/14 1339 02/23/14 1813  BP: 120/56 113/58 123/56 113/57  Pulse: 74 71 72 84  Temp:  97.2 F (36.2 C) 97.3 F (36.3 C) 97.9 F (36.6 C)  TempSrc:   Oral Oral  Resp: 15 11 16 16   Height:   6\' 2"  (1.88 m)   Weight:   90.266 kg (199 lb)   SpO2: 97% 96% 99% 94%    BMET  Recent Labs  02/23/14 0610  NA 136  K 4.7  CL 101  CO2 28  GLUCOSE 102*  BUN 33*  CREATININE 1.36*  CALCIUM 9.1    Patient sitting up on the side of the bed, in his lumbar brace, eating dinner. Has ambulated in the halls. Voiding well. Nursing staff reports dressing is clean and dry. Patient notes improvement in radicular pain and numbness into the lower extremities. Discussed wound care following discharge with the patient and his wife.  Plan: Continue to progress through postoperative recovery.  Hosie Spangle, MD 02/23/2014, 7:38 PM

## 2014-02-23 NOTE — Anesthesia Postprocedure Evaluation (Signed)
  Anesthesia Post-op Note  Patient: Eduardo Shaw  Procedure(s) Performed: Procedure(s) with comments: BILATERAL LUMBAR THREE-FOUR LAMINOTOMY AND MICRODISCECTOMY WITH RIGHT LUMBAR FOUR-FIVE LAMINOTOMY AND MICRODISCECTOMY (Bilateral) - Bilateral L34 laminotomy with Left L34 microdiskectomy and possible Right L34 microdiskectomy and Right L45 Laminotomy and foraminotomy  Patient Location: PACU  Anesthesia Type:General  Level of Consciousness: awake  Airway and Oxygen Therapy: Patient Spontanous Breathing  Post-op Pain: mild  Post-op Assessment: Post-op Vital signs reviewed  Post-op Vital Signs: Reviewed  Last Vitals:  Filed Vitals:   02/23/14 1230  BP:   Pulse: 79  Temp:   Resp: 13    Complications: No apparent anesthesia complications

## 2014-02-23 NOTE — Op Note (Signed)
02/23/2014  12:01 PM  PATIENT:  Eduardo Shaw  69 y.o. male  PRE-OPERATIVE DIAGNOSIS:  L3-4 lumbar herniated disc, L3-4 lumbar stenosis, right L4-5 lateral recess stenosis, lumbar spondylosis, lumbar degenerative disease, lumbar radiculopathy, bilateral foot drop (left worse than right)  POST-OPERATIVE DIAGNOSIS: L3-4 lumbar herniated disc, L3-4 lumbar stenosis, right L4-5 lateral recess stenosis, lumbar spondylosis, lumbar degenerative disease, lumbar radiculopathy, bilateral foot drop (left worse than right)  PROCEDURE:  Procedure(s):  BILATERAL LUMBAR THREE-FOUR LAMINOTOMY AND MICRODISCECTOMY WITH RIGHT LUMBAR FOUR-FIVE LAMINOTOMY AND FORAMINOTOMY WITH MICRODISSECTION, MICROSURGICAL TECHNIQUE, AND THE OPERATING MICROSCOPE  SURGEON:  Surgeon(s): Hosie Spangle, MD Floyce Stakes, MD  ASSISTANTS: Leeroy Cha, M.D.  ANESTHESIA:   general  EBL:  Total I/O In: 1250 [I.V.:1000; IV Piggyback:250] Out: -   BLOOD ADMINISTERED:none  COUNT: Correct per nursing staff  DICTATION: Patient was brought to the operating room and placed under general endotracheal anesthesia. Patient was turned to prone position the lumbar region was prepped with Betadine soap and solution and draped in a sterile fashion. The midline was infiltrated with local anesthetic with epinephrine. A localizing x-ray was taken and the L3-4 and L4-5 levels were identified. Midline incision was made over the L3-4 and L4-5 levels and was carried down through the subcutaneous tissue to the lumbar fascia. The lumbar fascia was incised bilaterally and the paraspinal muscles were dissected from the spinous processes and lamina in a subperiosteal fashion. Additional x-rays were taken and the L3-4, bilaterally, and L4-5 intralaminar spaces were identified. The operating microscope was draped and brought into the field provided additional magnification, illumination, and visualization. Laminotomy was performed bilaterally at L3-4,  and on the right side at L4-5 using the high-speed drill and Kerrison punches. The ligamentum flavum was carefully resected at each exposure. The underlying thecal sac and nerve roots were identified. The disc herniation at L3-4 was identified. Discectomy was begun on the left side at L3-4. The annulus was incised and a significant fragment was removed, however there remained further disc protrusion, and therefore the disc space was entered and a thorough discectomy was performed using a variety of pituitary rongeurs and micro-curettes. We then examined the right ventral epidural space, and there remained significant subligamentous disc herniation causing thecal sac and nerve root compression, and therefore the annulus on the right side was incised, and the discectomy continued, again using a variety of pituitary rongeurs and micro-curettes. All loose fragments of disc material removed from the epidural space and intervertebral disc space at the L3-4 level, with excellent decompression of the thecal sac and exiting nerve roots. At L4-5 solely a laminotomy and foraminotomy was performed, with removal of hypertrophic ligamentum flavum and bony overgrowth, with good decompression of the thecal sac and exiting nerve root. Once the discectomy at L3-4 was completed and the laminotomy and foraminotomy completed on the right side at L4-5, and good decompression of the thecal sac and nerve had been achieved bilaterally at L3-4 and on the right side at L4-5, hemostasis was established with the use of bipolar cautery, Gelfoam with thrombin, and Surgifoam. The Gelfoam was removed and hemostasis confirmed. We then instilled 2 cc of fentanyl and 80 mg of Depo-Medrol into the epidural space. Deep fascia was closed with interrupted undyed 1 Vicryl sutures. Scarpa's fascia was closed with interrupted undyed 2-0 Vicryl sutures, and the subcutaneous and subcuticular layer were closed with interrupted inverted 2-0 undyed Vicryl  sutures. The skin edges were approximated with Dermabond. A dressing of sterile gauze and Hypafix  was applied. Following surgery the patient was turned back to a supine position to be reversed from the anesthetic extubated and transferred to the recovery room for further care.   PLAN OF CARE: Admit to inpatient   PATIENT DISPOSITION:  PACU - hemodynamically stable.   Delay start of Pharmacological VTE agent (>24hrs) due to surgical blood loss or risk of bleeding:  yes

## 2014-02-23 NOTE — H&P (Signed)
Subjective: Patient is a 69 y.o. male who is admitted for treatment of a large L3-4 lumbar disc herniation, left worse than right, and right L45 lateral recess stenosis. Patient presented with acute back pain with pain and numbness radiating into the lower extremities left worse than right. Symptoms began a little over 2 weeks ago, and he has not responded to several courses of prednisone nor to physical therapy. Rather his symptoms have steadily worsened. On examination he was found to have a bilateral foot drop, left worse than right. He is admitted now for bilateral L3-4 lumbar laminotomy, left L3-4 microdiscectomy, possible right L3-4 micro-discectomy, and right L4-5 lumbar laminotomy and foraminotomy.   Patient Active Problem List   Diagnosis Date Noted  . Lumbar disc disease with radiculopathy 02/13/2014  . Aftercare following surgery of the circulatory system, [place] 10/17/2013  . Hyperlipidemia 10/26/2011  . Erectile dysfunction 10/26/2011  . History of skin cancer 10/26/2011  . Left ankle pain 10/26/2011  . Occlusion and stenosis of carotid artery without mention of cerebral infarction 08/25/2011  . Stroke 07/31/2011  . Carotid stenosis 07/31/2011  . SARCOIDOSIS 09/20/2007  . Nonspecific (abnormal) findings on radiological and other examination of body structure 09/20/2007  . ABNORMAL RADIOLOGIAL EXAMINATION 09/20/2007   Past Medical History  Diagnosis Date  . Hypercholesterolemia   . Arthritis of foot   . Skin cancer   . Lactose intolerance   . Carotid artery occlusion   . Stroke June 2013    Mini     Past Surgical History  Procedure Laterality Date  . Mohs surgery      to face  . Endarterectomy  08/03/2011    Procedure: ENDARTERECTOMY CAROTID;  Surgeon: Angelia Mould, MD;  [facility]: [facility] OR;  Service: Vascular;  Laterality: Right;  . Carotid endarterectomy Right August 03, 2011    CE  . Tonsillectomy      Prescriptions prior to admission  Medication Sig Dispense  Refill Last Dose  . aspirin EC 325 MG tablet Take 325 mg by mouth daily.   1 week  . atorvastatin (LIPITOR) 40 MG tablet TAKE 1 TABLET BY MOUTH EVERY DAY AT 6PM 90 tablet 0 02/22/2014 at 600 pm  . cyclobenzaprine (FLEXERIL) 10 MG tablet Take 1 tablet (10 mg total) by mouth 3 (three) times daily as needed for muscle spasms. 30 tablet 0 02/22/2014 at 800 pm  . Hydrocodone-Acetaminophen (VICODIN ES) 7.5-300 MG TABS 1/2-1 tablet 3 times daily when necessary for severe pain 50 each 0 02/23/2014 at 0530  . predniSONE (DELTASONE) 20 MG tablet 2 tabs x 3 days, 1 tab x 3 days, 1/2 tab x 3 days, 1/2 tab M,W,F x 2 weeks 40 tablet 1 02/23/2014 at 0530   Allergies  Allergen Reactions  . Morphine And Related Nausea And Vomiting    Gi upset  . Other     Bee sting - swelling in the areas stung    History  Substance Use Topics  . Smoking status: Never Smoker   . Smokeless tobacco: Never Used  . Alcohol Use: Yes     Comment: 1-2 drinks on weekends    Family History  Problem Relation Age of Onset  . Lupus Mother   . Stroke Mother   . Lupus Sister      Review of Systems A comprehensive review of systems was negative.  Objective: Vital signs in last 24 hours: Temp:  [98.3 F (36.8 C)] 98.3 F (36.8 C) (01/16 0702) Pulse Rate:  [69] 69 (  01/16 1740) Resp:  [20] 20 (01/16 0702) BP: (141)/(68) 141/68 mmHg (01/16 0702) SpO2:  [97 %] 97 % (01/16 0702) Weight:  [90.719 kg (200 lb)] 90.719 kg (200 lb) (01/16 0702)  EXAM: Patient is a well-developed well-nourished white male in no acute distress.  Lungs are clear to auscultation , the patient has symmetrical respiratory excursion. Heart has a regular rate and rhythm normal S1 and S2 no murmur.   Abdomen is soft nontender nondistended bowel sounds are present. Extremity examination shows no clubbing cyanosis or edema. Neurologic examination shows on motor exam 5/5 strength in the iliopsoas and quadriceps bilaterally; however the left dorsiflexor and EHL are  1-2/5, and the right dorsiflexor and EHL are 3-4 minus/5. The left plantar flexor is 4/5. The right plantar flexors 5/5. Sensory examination shows intact sensation to pinprick in the legs and feet bilaterally. Reflexes are one of the quadriceps and absent at the gastrocnemius. They're symmetrical bilaterally. Toes are downgoing bilaterally. Gait and stance are unsteady, without the support of his crutches or without somebody standing next him and holding onto him.  Data Review:CBC    Component Value Date/Time   WBC 8.9 08/04/2011 0400   RBC 4.24 08/04/2011 0400   HGB 12.9* 08/04/2011 0400   HCT 38.2* 08/04/2011 0400   PLT 145* 08/04/2011 0400   MCV 90.1 08/04/2011 0400   MCH 30.4 08/04/2011 0400   MCHC 33.8 08/04/2011 0400   RDW 13.0 08/04/2011 0400   LYMPHSABS 2.1 07/30/2011 1531   MONOABS 0.5 07/30/2011 1531   EOSABS 0.1 07/30/2011 1531   BASOSABS 0.0 07/30/2011 1531                          BMET    Component Value Date/Time   NA 143 10/26/2011 1503   K 4.4 10/26/2011 1503   CL 108 10/26/2011 1503   CO2 29 10/26/2011 1503   GLUCOSE 71 10/26/2011 1503   BUN 25* 10/26/2011 1503   CREATININE 1.2 10/26/2011 1503   CALCIUM 8.9 10/26/2011 1503   GFRNONAA 68* 08/04/2011 0400   GFRAA 78* 08/04/2011 0400     Assessment/Plan: Patient presenting with acute low back and bilateral lumbar radiculopathy, with bilateral foot drop, left worse than right. MRI reveals large L3-4 disc herniation, extending bilaterally but worse to the left than to the right and also right L4-5 lateral recess stenosis.  He is admitted now for a bilateral L3-4 lumbar laminotomy, a left L3-4 micro-discectomy, a possible right L3-4 microdiscectomy, and right L4-5 lumbar laminotomy and foraminotomy.  I've discussed with the patient the nature of his condition, the nature the surgical procedure, the typical length of surgery, hospital stay, and overall recuperation. We discussed limitations postoperatively. I discussed  risks of surgery including risks of infection, bleeding, possibly need for transfusion, the risk of nerve root dysfunction with pain, weakness, numbness, or paresthesias, or risk of dural tear and CSF leakage and possible need for further surgery, the risk of recurrent disc herniation and the possible need for further surgery, and the risk of anesthetic complications including myocardial infarction, stroke, pneumonia, and death. Understanding all this the patient does wish to proceed with surgery and is admitted for such.    Hosie Spangle, MD 02/23/2014 7:17 AM

## 2014-02-23 NOTE — Anesthesia Preprocedure Evaluation (Addendum)
Anesthesia Evaluation  Patient identified by MRN, date of birth, ID band Patient awake    Reviewed: Allergy & Precautions, NPO status , Patient's Chart, lab work & pertinent test results  Airway Mallampati: I  TM Distance: >3 FB Neck ROM: Full    Dental  (+) Teeth Intact, Dental Advisory Given   Pulmonary neg pulmonary ROS,          Cardiovascular + Peripheral Vascular Disease     Neuro/Psych CVA, No Residual Symptoms    GI/Hepatic negative GI ROS, Neg liver ROS,   Endo/Other  negative endocrine ROS  Renal/GU negative Renal ROS     Musculoskeletal   Abdominal   Peds  Hematology   Anesthesia Other Findings   Reproductive/Obstetrics                            Anesthesia Physical Anesthesia Plan  ASA: II  Anesthesia Plan: General   Post-op Pain Management:    Induction: Intravenous  Airway Management Planned: Oral ETT  Additional Equipment: None  Intra-op Plan:   Post-operative Plan: Extubation in OR  Informed Consent: I have reviewed the patients History and Physical, chart, labs and discussed the procedure including the risks, benefits and alternatives for the proposed anesthesia with the patient or authorized representative who has indicated his/her understanding and acceptance.   Dental advisory given  Plan Discussed with: CRNA, Anesthesiologist and Surgeon  Anesthesia Plan Comments:         Anesthesia Quick Evaluation

## 2014-02-23 NOTE — Transfer of Care (Signed)
Immediate Anesthesia Transfer of Care Note  Patient: Eduardo Shaw  Procedure(s) Performed: Procedure(s) with comments: BILATERAL LUMBAR THREE-FOUR LAMINOTOMY AND MICRODISCECTOMY WITH RIGHT LUMBAR FOUR-FIVE LAMINOTOMY AND MICRODISCECTOMY (Bilateral) - Bilateral L34 laminotomy with Left L34 microdiskectomy and possible Right L34 microdiskectomy and Right L45 Laminotomy and foraminotomy  Patient Location: PACU  Anesthesia Type:General  Level of Consciousness: awake and alert   Airway & Oxygen Therapy: Patient Spontanous Breathing and Patient connected to nasal cannula oxygen  Post-op Assessment: Report given to PACU RN, Post -op Vital signs reviewed and stable and Patient moving all extremities  Post vital signs: Reviewed and stable  Complications: No apparent anesthesia complications

## 2014-02-23 NOTE — Anesthesia Procedure Notes (Signed)
Procedure Name: MAC, LMA Insertion, Intubation and Awake intubation Date/Time: 02/23/2014 8:39 AM Performed by: Melina Copa, Chaquita Basques R Pre-anesthesia Checklist: Patient identified, Emergency Drugs available, Suction available, Patient being monitored and Timeout performed Patient Re-evaluated:Patient Re-evaluated prior to inductionOxygen Delivery Method: Circle system utilized Preoxygenation: Pre-oxygenation with 100% oxygen Intubation Type: IV induction Ventilation: Mask ventilation without difficulty Laryngoscope Size: Mac and 4 Grade View: Grade II Tube type: Oral Tube size: 7.0 mm Number of attempts: 1 Airway Equipment and Method: Stylet Placement Confirmation: ETT inserted through vocal cords under direct vision,  positive ETCO2 and breath sounds checked- equal and bilateral Secured at: 22 cm Tube secured with: Tape Dental Injury: Teeth and Oropharynx as per pre-operative assessment

## 2014-02-23 NOTE — Progress Notes (Signed)
Patient ambulated in the hallway 100 feet.  Patient tolerated well, and had zero pain before, during and after ambulation.  Patient is sitting in the chair.  Will continue to monitor patient.

## 2014-02-24 ENCOUNTER — Other Ambulatory Visit: Payer: Self-pay | Admitting: Family Medicine

## 2014-02-24 MED ORDER — OXYCODONE-ACETAMINOPHEN 5-325 MG PO TABS: 1.0000 | ORAL_TABLET | ORAL | Status: DC | PRN

## 2014-02-24 NOTE — Progress Notes (Signed)
Patient [place] home via car with wife  DC instructions and prescription given to patient and fully understood.  Vital signs and assessments were stable prior to discharge.

## 2014-02-24 NOTE — Care Management Note (Signed)
    Page 1 of 1   02/24/2014     8:32:30 AM CARE MANAGEMENT NOTE 02/24/2014  Patient:  Eduardo Shaw, Eduardo Shaw   Account Number:  [id number]  Date Initiated:  02/24/2014  Documentation initiated by:  [facility]  Subjective/Objective Assessment:   adm: BILATERAL LUMBAR THREE-FOUR LAMINOTOMY AND MICRODISCECTOMY WITH RIGHT LUMBAR FOUR-FIVE LAMINOTOMY AND FORAMINOTOMY WITH MICRODISSECTION, MICROSURGICAL TECHNIQUE, AND THE OPERATING MICROSCOPE     Action/Plan:   discharge planning   Anticipated DC Date:  02/24/2014   Anticipated DC Plan:  [place]  CM consult      Choice offered to / List presented to:     DME arranged  Vassie Moselle      DME agency  [employer].        Status of service:  Completed, signed off Medicare Important Message given?   (If response is "NO", the following Medicare IM given date fields will be blank) Date Medicare IM given:   Medicare IM given by:   Date Additional Medicare IM given:   Additional Medicare IM given by:    Discharge Disposition:  HOME/SELF CARE  Per UR Regulation:    If discussed at Long Length of Stay Meetings, dates discussed:    Comments:  02/24/14 08:30 Cm received call from RN to please arange for rolling wlker.  CM called [facility] DME rep,James to please deliver the rolling walker to room ASAP.  No other CM needs were communicated. Mariane Masters, BSN, CM [phone].

## 2014-02-24 NOTE — Discharge Summary (Signed)
Physician Discharge Summary  Patient ID: Eduardo Shaw MRN: [id number] DOB/AGE: [id number] y.o.  Admit date: 02/23/2014 Discharge date: 02/24/2014  Admission Diagnoses:  L3-4 lumbar herniated disc, L3-4 lumbar stenosis, right L4-5 lateral recess stenosis, lumbar spondylosis, lumbar degenerative disease, lumbar radiculopathy, bilateral foot drop (left worse than right)  Discharge Diagnoses:  L3-4 lumbar herniated disc, L3-4 lumbar stenosis, right L4-5 lateral recess stenosis, lumbar spondylosis, lumbar degenerative disease, lumbar radiculopathy, bilateral foot drop (left worse than right) Active Problems:   HNP (herniated nucleus pulposus), lumbar   Discharged Condition: good  Hospital Course: Patient was admitted, underwent a bilateral L3-4 lumbar laminotomy and microdiscectomy and a right L4-5 lumbar laminotomy and foraminotomy for a large L3-4 lumbar disc herniation and right L3-4 lateral recess stenosis. He had presented with bilateral foot drops, left worse than right. Postoperatively he has done well with excellent relief of his pain and numbness, and significant recovery of motor function. At the time of discharge his left dorsiflexor is 2-3, left EHL is 3, left plantar flexor is 4+, right dorsiflexor is 5, right EHL is 4, and right plantar flexors 5. Dressing was removed on the morning of discharge, and the incision is clean and dry, there is no swelling, erythema, or drainage. The patient's been given instructions regarding wound care and activities following discharge. He is to return for follow-up with me in about 3 weeks. We have arranged for the patient to be provided with a rolling walker with 5 inch wheels, which has been using here in the hospital.  Discharge Exam: Blood pressure 120/56, pulse 79, temperature 98.1 F (36.7 C), temperature source Oral, resp. rate 16, height 6\' 2"  (1.88 m), weight 90.266 kg (199 lb), SpO2 98 %.  Disposition: Home     Medication List     TAKE these medications        aspirin EC 325 MG tablet  Take 325 mg by mouth daily.     atorvastatin 40 MG tablet  Commonly known as:  LIPITOR  TAKE 1 TABLET BY MOUTH EVERY DAY AT 6PM     cyclobenzaprine 10 MG tablet  Commonly known as:  FLEXERIL  Take 1 tablet (10 mg total) by mouth 3 (three) times daily as needed for muscle spasms.     Hydrocodone-Acetaminophen 7.5-300 MG Tabs  Commonly known as:  VICODIN ES  1/2-1 tablet 3 times daily when necessary for severe pain     oxyCODONE-acetaminophen 5-325 MG per tablet  Commonly known as:  PERCOCET/ROXICET  Take 1-2 tablets by mouth every 4 (four) hours as needed for moderate pain.     predniSONE 20 MG tablet  Commonly known as:  DELTASONE  2 tabs x 3 days, 1 tab x 3 days, 1/2 tab x 3 days, 1/2 tab M,W,F x 2 weeks         Signed: Hosie Spangle, MD 02/24/2014, 8:24 AM

## 2014-02-25 NOTE — Progress Notes (Signed)
UR COMPLETED  

## 2014-02-27 ENCOUNTER — Encounter (HOSPITAL_COMMUNITY): Payer: Self-pay | Admitting: Neurosurgery

## 2014-03-11 ENCOUNTER — Other Ambulatory Visit: Payer: Self-pay

## 2014-03-18 DIAGNOSIS — M21371 Foot drop, right foot: Secondary | ICD-10-CM | POA: Diagnosis not present

## 2014-03-18 DIAGNOSIS — M5416 Radiculopathy, lumbar region: Secondary | ICD-10-CM | POA: Diagnosis not present

## 2014-03-19 ENCOUNTER — Encounter: Payer: Self-pay | Admitting: Family Medicine

## 2014-06-20 ENCOUNTER — Other Ambulatory Visit: Payer: Self-pay

## 2014-06-20 MED ORDER — ATORVASTATIN CALCIUM 40 MG PO TABS: ORAL_TABLET | ORAL | Status: DC

## 2014-06-25 ENCOUNTER — Ambulatory Visit (AMBULATORY_SURGERY_CENTER): Payer: Self-pay | Admitting: *Deleted

## 2014-06-25 VITALS — Ht 74.0 in | Wt 203.8 lb

## 2014-06-25 DIAGNOSIS — Z1211 Encounter for screening for malignant neoplasm of colon: Secondary | ICD-10-CM

## 2014-06-25 MED ORDER — ATORVASTATIN CALCIUM 40 MG PO TABS: ORAL_TABLET | ORAL | Status: DC

## 2014-06-25 NOTE — Progress Notes (Signed)
Previous colon 12 years ago in [place]. Normal per pt. He cannot remember doctor or practice for [place] to obtain old report. Lived there 5 years or shorter and had no PCP, no recollection of doctor  Lelan Pons PV No diet pills No home 02 No egg or soy allergy No issues with past sedation emmi to e mail

## 2014-06-25 NOTE — Addendum Note (Signed)
Addended by: Townsend Roger D on: 06/25/2014 01:50 PM   Modules accepted: Orders

## 2014-06-27 ENCOUNTER — Other Ambulatory Visit (INDEPENDENT_AMBULATORY_CARE_PROVIDER_SITE_OTHER): Payer: Medicare Other

## 2014-06-27 DIAGNOSIS — Z125 Encounter for screening for malignant neoplasm of prostate: Secondary | ICD-10-CM | POA: Diagnosis not present

## 2014-06-27 DIAGNOSIS — Z0000 Encounter for general adult medical examination without abnormal findings: Secondary | ICD-10-CM | POA: Diagnosis not present

## 2014-06-27 DIAGNOSIS — E785 Hyperlipidemia, unspecified: Secondary | ICD-10-CM | POA: Diagnosis not present

## 2014-06-27 LAB — CBC WITH DIFFERENTIAL/PLATELET
BASOS PCT: 0.4 % (ref 0.0–3.0)
Basophils Absolute: 0 10*3/uL (ref 0.0–0.1)
EOS ABS: 0.3 10*3/uL (ref 0.0–0.7)
EOS PCT: 4.2 % (ref 0.0–5.0)
HCT: 43.7 % (ref 39.0–52.0)
Hemoglobin: 15.1 g/dL (ref 13.0–17.0)
Lymphocytes Relative: 28 % (ref 12.0–46.0)
Lymphs Abs: 1.9 10*3/uL (ref 0.7–4.0)
MCHC: 34.5 g/dL (ref 30.0–36.0)
MCV: 88.9 fl (ref 78.0–100.0)
Monocytes Absolute: 0.5 10*3/uL (ref 0.1–1.0)
Monocytes Relative: 7.9 % (ref 3.0–12.0)
NEUTROS PCT: 59.5 % (ref 43.0–77.0)
Neutro Abs: 4 10*3/uL (ref 1.4–7.7)
PLATELETS: 215 10*3/uL (ref 150.0–400.0)
RBC: 4.92 Mil/uL (ref 4.22–5.81)
RDW: 13.2 % (ref 11.5–15.5)
WBC: 6.8 10*3/uL (ref 4.0–10.5)

## 2014-06-27 LAB — HEPATIC FUNCTION PANEL
ALBUMIN: 4.1 g/dL (ref 3.5–5.2)
ALK PHOS: 70 U/L (ref 39–117)
ALT: 13 U/L (ref 0–53)
AST: 16 U/L (ref 0–37)
Bilirubin, Direct: 0.1 mg/dL (ref 0.0–0.3)
TOTAL PROTEIN: 6.6 g/dL (ref 6.0–8.3)
Total Bilirubin: 0.6 mg/dL (ref 0.2–1.2)

## 2014-06-27 LAB — POCT URINALYSIS DIPSTICK
Bilirubin, UA: NEGATIVE
Blood, UA: NEGATIVE
Glucose, UA: NEGATIVE
KETONES UA: NEGATIVE
LEUKOCYTES UA: NEGATIVE
NITRITE UA: NEGATIVE
PROTEIN UA: NEGATIVE
Spec Grav, UA: 1.025
Urobilinogen, UA: 0.2
pH, UA: 5

## 2014-06-27 LAB — LIPID PANEL
CHOL/HDL RATIO: 3
Cholesterol: 152 mg/dL (ref 0–200)
HDL: 49.9 mg/dL (ref >39.00)
LDL Cholesterol: 85 mg/dL (ref 0–99)
NONHDL: 102.1
Triglycerides: 86 mg/dL (ref 0.0–149.0)
VLDL: 17.2 mg/dL (ref 0.0–40.0)

## 2014-06-27 LAB — BASIC METABOLIC PANEL
BUN: 21 mg/dL (ref 6–23)
CALCIUM: 9.5 mg/dL (ref 8.4–10.5)
CO2: 26 mEq/L (ref 19–32)
Chloride: 109 mEq/L (ref 96–112)
Creatinine, Ser: 1.18 mg/dL (ref 0.40–1.50)
GFR: 64.85 mL/min (ref >60.00)
Glucose, Bld: 100 mg/dL — ABNORMAL HIGH (ref 70–99)
Potassium: 4.2 mEq/L (ref 3.5–5.1)
SODIUM: 141 meq/L (ref 135–145)

## 2014-06-27 LAB — PSA: PSA: 2.27 ng/mL (ref 0.10–4.00)

## 2014-06-27 LAB — TSH: TSH: 2.95 u[IU]/mL (ref 0.35–4.50)

## 2014-07-04 ENCOUNTER — Ambulatory Visit (INDEPENDENT_AMBULATORY_CARE_PROVIDER_SITE_OTHER): Payer: Medicare Other | Admitting: Family Medicine

## 2014-07-04 ENCOUNTER — Encounter: Payer: Self-pay | Admitting: Family Medicine

## 2014-07-04 VITALS — BP 120/84 | Temp 98.1°F | Ht 72.0 in | Wt 203.0 lb

## 2014-07-04 DIAGNOSIS — I639 Cerebral infarction, unspecified: Secondary | ICD-10-CM | POA: Diagnosis not present

## 2014-07-04 DIAGNOSIS — Z85828 Personal history of other malignant neoplasm of skin: Secondary | ICD-10-CM

## 2014-07-04 DIAGNOSIS — R1310 Dysphagia, unspecified: Secondary | ICD-10-CM | POA: Insufficient documentation

## 2014-07-04 DIAGNOSIS — E785 Hyperlipidemia, unspecified: Secondary | ICD-10-CM

## 2014-07-04 DIAGNOSIS — I6522 Occlusion and stenosis of left carotid artery: Secondary | ICD-10-CM | POA: Diagnosis not present

## 2014-07-04 DIAGNOSIS — Z23 Encounter for immunization: Secondary | ICD-10-CM | POA: Diagnosis not present

## 2014-07-04 MED ORDER — ATORVASTATIN CALCIUM 40 MG PO TABS: ORAL_TABLET | ORAL | Status: DC

## 2014-07-04 NOTE — Patient Instructions (Signed)
Continue current medications  Continue exercise program  Follow-up in one year sooner if any problems  Rachel's extension is [phone]  [employer]...............Marland Kitchen our new adult nurse practitioner from [place].

## 2014-07-04 NOTE — Progress Notes (Signed)
Subjective:    Patient ID: WYMON Shaw, male    DOB: [birth year], 69 y.o.   MRN: [id number]  HPI Eduardo Shaw is a 69 year old married male nonsmoker who comes in today for general physical examination because of a history of hyperlipidemia and a secondary stroke,,, right , carotid disease, most recently had lumbar disc surgery January 2016 he's followed by vascular yearly.  Dr. Sherwood Gambler did his lumbar disc surgery is recovering nicely. He's walking every day and doing his exercises. Still not playing golf yet.  He does not get routine eye care recommended Dr. Bing Plume, does get regular dental care, going 07/09/2014 for follow-up colonoscopy  Vaccinations up-to-date.......Marland Kitchen except Pneumovax and shingles. Pneumovax given today he'll call his insurance company about the shingles.  Cognitive function normal he walks daily home health safety reviewed no issues identified, no guns in the house, he does have a healthcare power of attorney and living well  Overall he says he feels well except he is having difficulty swallowing. It's been going on for about a year seems to get worse last couple months. He points to his [place] area saying that food since gets stuck there. He doesn't have to vomit. He waits for Mariann Laster goes down. No difficulty swallowing liquids. He's due for colonoscopy May 31 in a couple days.......... I will call and also he will call Dr. Kristeen Miss office to see if we can get an endoscopy at the same time.   Review of Systems  Constitutional: Negative.   HENT: Negative.   Eyes: Negative.   Respiratory: Negative.   Cardiovascular: Negative.   Gastrointestinal: Negative.   Endocrine: Negative.   Genitourinary: Negative.   Musculoskeletal: Negative.   Skin: Negative.   Allergic/Immunologic: Negative.   Neurological: Negative.   Hematological: Negative.   Psychiatric/Behavioral: Negative.        Objective:   Physical Exam  Constitutional: He is oriented to person, place, and  time. He appears well-developed and well-nourished.  HENT:  Head: Normocephalic and atraumatic.  Right Ear: External ear normal.  Left Ear: External ear normal.  Nose: Nose normal.  Mouth/Throat: Oropharynx is clear and moist.  Eyes: Conjunctivae and EOM are normal. Pupils are equal, round, and reactive to light.  Neck: Normal range of motion. Neck supple. No JVD present. No tracheal deviation present. No thyromegaly present.  Cardiovascular: Normal rate, regular rhythm, normal heart sounds and intact distal pulses.  Exam reveals no gallop and no friction rub.   No murmur heard. Pulmonary/Chest: Effort normal and breath sounds normal. No stridor. No respiratory distress. He has no wheezes. He has no rales. He exhibits no tenderness.  Abdominal: Soft. Bowel sounds are normal. He exhibits no distension and no mass. There is no tenderness. There is no rebound and no guarding.  Genitourinary: Rectum normal, prostate normal and penis normal. Guaiac negative stool. No penile tenderness.  Musculoskeletal: Normal range of motion. He exhibits no edema or tenderness.  Lymphadenopathy:    He has no cervical adenopathy.  Neurological: He is alert and oriented to person, place, and time. He has normal reflexes. No cranial nerve deficit. He exhibits normal muscle tone.  Skin: Skin is warm and dry. No rash noted. No erythema. No pallor.  Total body skin exam was normal so for scar right chest from previous excision I believe that was a melanoma. His other skin cancers. He sees his dermatologist yearly. Also scar right neck from previous carotid endarterectomy  Psychiatric: He has a normal mood and affect. His  behavior is normal. Judgment and thought content normal.  Nursing note and vitals reviewed.         Assessment & Plan:  Hyperlipidemia goal.....Marland Kitchen continue current therapy  Status post lumbar disc disease 4 months ago.......Marland Kitchen recovering nicely  Status post stroke secondary to right carotid  occlusion......... status post right carotid endarterectomy........ no stroke symptoms except for some residual numbness in his left foot.  History of skin cancer,,,,,,,, followed by Dr. Martinique in dermatology  Difficulty swallowing solid food....... hopefully get endoscopy next week when he gets a screening colonoscopy for evaluation ...........Marland Kitchen

## 2014-07-04 NOTE — Progress Notes (Signed)
Pre visit review using our clinic review tool, if applicable. No additional management support is needed unless otherwise documented below in the visit note. 

## 2014-07-09 ENCOUNTER — Encounter: Payer: Self-pay | Admitting: Internal Medicine

## 2014-07-09 ENCOUNTER — Ambulatory Visit (AMBULATORY_SURGERY_CENTER): Payer: Medicare Other | Admitting: Internal Medicine

## 2014-07-09 VITALS — BP 145/63 | HR 59 | Temp 96.0°F | Resp 16 | Ht 74.0 in | Wt 203.0 lb

## 2014-07-09 DIAGNOSIS — D123 Benign neoplasm of transverse colon: Secondary | ICD-10-CM | POA: Diagnosis not present

## 2014-07-09 DIAGNOSIS — D122 Benign neoplasm of ascending colon: Secondary | ICD-10-CM

## 2014-07-09 DIAGNOSIS — Z1211 Encounter for screening for malignant neoplasm of colon: Secondary | ICD-10-CM | POA: Diagnosis present

## 2014-07-09 MED ORDER — SODIUM CHLORIDE 0.9 % IV SOLN: 500.0000 mL | INTRAVENOUS | Status: DC

## 2014-07-09 NOTE — Progress Notes (Signed)
Called to room to assist during endoscopic procedure.  Patient ID and intended procedure confirmed with present staff. Received instructions for my participation in the procedure from the performing physician.  

## 2014-07-09 NOTE — Patient Instructions (Signed)
YOU HAD AN ENDOSCOPIC PROCEDURE TODAY AT THE [facility]:   Refer to the procedure report that was given to you for any specific questions about what was found during the examination.  If the procedure report does not answer your questions, please call your gastroenterologist to clarify.  If you requested that your care partner not be given the details of your procedure findings, then the procedure report has been included in a sealed envelope for you to review at your convenience later.  YOU SHOULD EXPECT: Some feelings of bloating in the abdomen. Passage of more gas than usual.  Walking can help get rid of the air that was put into your GI tract during the procedure and reduce the bloating. If you had a lower endoscopy (such as a colonoscopy or flexible sigmoidoscopy) you may notice spotting of blood in your stool or on the toilet paper. If you underwent a bowel prep for your procedure, you may not have a normal bowel movement for a few days.  Please Note:  You might notice some irritation and congestion in your nose or some drainage.  This is from the oxygen used during your procedure.  There is no need for concern and it should clear up in a day or so.  SYMPTOMS TO REPORT IMMEDIATELY:   Following lower endoscopy (colonoscopy or flexible sigmoidoscopy):  Excessive amounts of blood in the stool  Significant tenderness or worsening of abdominal pains  Swelling of the abdomen that is new, acute  Fever of 100F or higher   For urgent or emergent issues, a gastroenterologist can be reached at any hour by calling [phone].   DIET: Your first meal following the procedure should be a small meal and then it is ok to progress to your normal diet. Heavy or fried foods are harder to digest and may make you feel nauseous or bloated.  Likewise, meals heavy in dairy and vegetables can increase bloating.  Drink plenty of fluids but you should avoid alcoholic beverages for 24  hours.  ACTIVITY:  You should plan to take it easy for the rest of today and you should NOT DRIVE or use heavy machinery until tomorrow (because of the sedation medicines used during the test).    FOLLOW UP: Our staff will call the number listed on your records the next business day following your procedure to check on you and address any questions or concerns that you may have regarding the information given to you following your procedure. If we do not reach you, we will leave a message.  However, if you are feeling well and you are not experiencing any problems, there is no need to return our call.  We will assume that you have returned to your regular daily activities without incident.  If any biopsies were taken you will be contacted by phone or by letter within the next 1-3 weeks.  Please call us at [phone] if you have not heard about the biopsies in 3 weeks.    SIGNATURES/CONFIDENTIALITY: You and/or your care partner have signed paperwork which will be entered into your electronic medical record.  These signatures attest to the fact that that the information above on your After Visit Summary has been reviewed and is understood.  Full responsibility of the confidentiality of this discharge information lies with you and/or your care-partner. 

## 2014-07-09 NOTE — Op Note (Signed)
[place]  [address] [place], [id number]   1COLONOSCOPY PROCEDURE REPORT  PATIENT: Eduardo Shaw, Eduardo Shaw  MR#: [id number] BIRTHDATE: 1945/05/27 , 69  yrs. old GENDER: male ENDOSCOPIST: Eustace Quail, MD REFERRED QM:[id number] Todd, MD PROCEDURE DATE:  07/09/2014 PROCEDURE:   Colonoscopy, screening and Colonoscopy with snare polypectomy X4 First Screening Colonoscopy - Avg.  risk and is 50 yrs.  old or older Yes.  Prior Negative Screening - Now for repeat screening. N/A  History of Adenoma - Now for follow-up colonoscopy & has been > or = to 3 yrs.  N/A  Polyps removed today? Yes ASA CLASS:   Class II INDICATIONS:Screening for colonic neoplasia and Colorectal Neoplasm Risk Assessment for this procedure is average risk.. Reports negative colonoscopy in [place] about 10 years ago MEDICATIONS: Propofol 260 mg IV and Monitored anesthesia care  DESCRIPTION OF PROCEDURE:   After the risks benefits and alternatives of the procedure were thoroughly explained, informed consent was obtained.  The digital rectal exam revealed no abnormalities of the rectum.   The LB [id number]  endoscope was introduced through the anus and advanced to the cecum, which was identified by both the appendix and ileocecal valve. No adverse events experienced.   The quality of the prep was excellent. (MoviPrep was used)  The instrument was then slowly withdrawn as the colon was fully examined. Estimated blood loss is zero unless otherwise noted in this procedure report.  COLON FINDINGS: Four polyps were found in the transverse colon (5 mm) and ascending colon (4 mm, 10 mm sessile, 12 mm sessile).  A polypectomy was performed with a cold snare.  The resection was complete, the polyp tissue was completely retrieved and sent to histology.   There was moderate diverticulosis noted in the left colon.   The examination was otherwise normal.  Retroflexed views revealed internal hemorrhoids. The  time to cecum = 3.4 Withdrawal time = 15.9   The scope was withdrawn and the procedure completed. COMPLICATIONS: There were no immediate complications.  ENDOSCOPIC IMPRESSION: 1.   Four polyps were found in the transverse colon and ascending colon; polypectomy was performed with a cold snare 2.   Moderate diverticulosis was noted in the left colon 3.   The examination was otherwise normal  RECOMMENDATIONS: 1.  Repeat Colonoscopy in 3 years (number and size of polyps). 2.  Upper endoscopy has been scheduled for June 30 for intermittent solid food dysphagia -  1 year  eSigned:  Eustace Quail, MD 07/09/2014 12:07 PM   cc: The Patient and Dorena Cookey, MD

## 2014-07-09 NOTE — Progress Notes (Signed)
To recovery, report to Smith, RN, VSS 

## 2014-07-10 ENCOUNTER — Telehealth: Payer: Self-pay | Admitting: *Deleted

## 2014-07-10 NOTE — Telephone Encounter (Signed)
  Follow up Call-  Call back number 07/09/2014  Post procedure Call Back phone  # [phone]  Permission to leave phone message Yes     Patient questions:  Do you have a fever, pain , or abdominal swelling? No. Pain Score  0 *  Have you tolerated food without any problems?yes  Have you been able to return to your normal activities? Yes.    Do you have any questions about your discharge instructions: Diet   No. Medications  No. Follow up visit  No.  Do you have questions or concerns about your Care? No.  Actions: * If pain score is 4 or above: No action needed, pain <4.

## 2014-07-15 ENCOUNTER — Ambulatory Visit (AMBULATORY_SURGERY_CENTER): Payer: Medicare Other | Admitting: Internal Medicine

## 2014-07-15 ENCOUNTER — Encounter: Payer: Self-pay | Admitting: Internal Medicine

## 2014-07-15 VITALS — BP 114/64 | HR 65 | Temp 97.8°F | Resp 20 | Ht 74.0 in | Wt 203.0 lb

## 2014-07-15 DIAGNOSIS — K2100 Gastro-esophageal reflux disease with esophagitis, without bleeding: Secondary | ICD-10-CM

## 2014-07-15 DIAGNOSIS — R1314 Dysphagia, pharyngoesophageal phase: Secondary | ICD-10-CM

## 2014-07-15 DIAGNOSIS — K253 Acute gastric ulcer without hemorrhage or perforation: Secondary | ICD-10-CM | POA: Diagnosis not present

## 2014-07-15 DIAGNOSIS — R1310 Dysphagia, unspecified: Secondary | ICD-10-CM | POA: Diagnosis not present

## 2014-07-15 DIAGNOSIS — K222 Esophageal obstruction: Secondary | ICD-10-CM

## 2014-07-15 MED ORDER — OMEPRAZOLE 40 MG PO CPDR: 40.0000 mg | DELAYED_RELEASE_CAPSULE | Freq: Every day | ORAL | Status: DC

## 2014-07-15 MED ORDER — SODIUM CHLORIDE 0.9 % IV SOLN: 500.0000 mL | INTRAVENOUS | Status: DC

## 2014-07-15 NOTE — Progress Notes (Signed)
Called to room to assist during endoscopic procedure.  Patient ID and intended procedure confirmed with present staff. Received instructions for my participation in the procedure from the performing physician.  

## 2014-07-15 NOTE — Progress Notes (Signed)
A/ox3, pleased with MAC, report to RN 

## 2014-07-15 NOTE — Op Note (Signed)
[place]  [address] [place], [id number]   ENDOSCOPY PROCEDURE REPORT  PATIENT: Eduardo Shaw, Eduardo Shaw  MR#: [id number] BIRTHDATE: Jul 08, 1945 , 69  yrs. old GENDER: male ENDOSCOPIST: Eustace Quail, MD REFERRED BY:  Stevie Kern, MD PROCEDURE DATE:  07/15/2014 PROCEDURE:  EGD w/ biopsy and Maloney dilation of esophagus   - 36F ASA CLASS:     Class II INDICATIONS:  dysphagia. MEDICATIONS: Monitored anesthesia care and Propofol 120 mg IV TOPICAL ANESTHETIC: Cetacaine Spray  DESCRIPTION OF PROCEDURE: After the risks benefits and alternatives of the procedure were thoroughly explained, informed consent was obtained.  The LB [id number] endoscope was introduced through the mouth and advanced to the second portion of the duodenum , Without limitations.  The instrument was slowly withdrawn as the mucosa was fully examined.    EXAM:The esophagus revealed erosive esophagitis with peptic stricture formation at the gastroesophageal junction.  Stomach was remarkable for multiple antral erosions.  CLO biopsy taken.  The duodenum was normal.  Retroflexed views revealed a hiatal hernia. The scope was then withdrawn from the patient and the procedure completed. THERAPY: 54 French Maloney dilator was passed in the esophagus without resistance or heme. Tolerated well  COMPLICATIONS: There were no immediate complications.  ENDOSCOPIC IMPRESSION: 1. GERD complicated by erosive esophagitis and peptic stricture status post dilation 2. Antral erosions. Possibly from NSAIDs. CLO biopsy taken.  RECOMMENDATIONS: 1.  Clear liquids until 3 PM, then soft foods rest of day.  Resume prior diet tomorrow. 2.  Prescribed omeprazole 40 mg daily; #30; 11 refills 3.  Call office next 2-3 days to schedule an office appointment with Dr. Henrene Pastor in about 6-8 weeks  REPEAT EXAM:  eSigned:  Eustace Quail, MD 07/15/2014 1:33 PM    CC:The Patient and Dorena Cookey, MD

## 2014-07-15 NOTE — Patient Instructions (Signed)
YOU HAD AN ENDOSCOPIC PROCEDURE TODAY AT [facility]:   Refer to the procedure report that was given to you for any specific questions about what was found during the examination.  If the procedure report does not answer your questions, please call your gastroenterologist to clarify.  If you requested that your care partner not be given the details of your procedure findings, then the procedure report has been included in a sealed envelope for you to review at your convenience later.  YOU SHOULD EXPECT: Some feelings of bloating in the abdomen. Passage of more gas than usual.  Walking can help get rid of the air that was put into your GI tract during the procedure and reduce the bloating. If you had a lower endoscopy (such as a colonoscopy or flexible sigmoidoscopy) you may notice spotting of blood in your stool or on the toilet paper. If you underwent a bowel prep for your procedure, you may not have a normal bowel movement for a few days.  Please Note:  You might notice some irritation and congestion in your nose or some drainage.  This is from the oxygen used during your procedure.  There is no need for concern and it should clear up in a day or so.  SYMPTOMS TO REPORT IMMEDIATELY:    Following upper endoscopy (EGD)  Vomiting of blood or coffee ground material  New chest pain or pain under the shoulder blades  Painful or persistently difficult swallowing  New shortness of breath  Fever of 100F or higher  Black, tarry-looking stools  For urgent or emergent issues, a gastroenterologist can be reached at any hour by calling [phone].   DIET: Clear liquids until 3pm today, then you may go to a soft diet.  Tomorrow you may have a regular diet.  Drink plenty of fluids but you should avoid alcoholic beverages for 24 hours.  ACTIVITY:  You should plan to take it easy for the rest of today and you should NOT DRIVE or use heavy machinery until tomorrow (because of the sedation  medicines used during the test).    FOLLOW UP: Our staff will call the number listed on your records the next business day following your procedure to check on you and address any questions or concerns that you may have regarding the information given to you following your procedure. If we do not reach you, we will leave a message.  However, if you are feeling well and you are not experiencing any problems, there is no need to return our call.  We will assume that you have returned to your regular daily activities without incident.  If any biopsies were taken you will be contacted by phone or by letter within the next 1-3 weeks.  Please call us at [phone] if you have not heard about the biopsies in 3 weeks.    SIGNATURES/CONFIDENTIALITY: You and/or your care partner have signed paperwork which will be entered into your electronic medical record.  These signatures attest to the fact that that the information above on your After Visit Summary has been reviewed and is understood.  Full responsibility of the confidentiality of this discharge information lies with you and/or your care-partner.  Please, take your omeprazole as ordered.   The medication is in your pharmacy as ordered by Dr. Henrene Pastor.

## 2014-07-16 ENCOUNTER — Telehealth: Payer: Self-pay

## 2014-07-16 NOTE — Telephone Encounter (Signed)
  Follow up Call-  Call back number 07/15/2014 07/09/2014  Post procedure Call Back phone  # [phone]  Permission to leave phone message Yes Yes     Patient questions:  Do you have a fever, pain , or abdominal swelling? No. Pain Score  0 *  Have you tolerated food without any problems? Yes.    Have you been able to return to your normal activities? Yes.    Do you have any questions about your discharge instructions: Diet   No. Medications  No. Follow up visit  No.  Do you have questions or concerns about your Care? No.  Actions: * If pain score is 4 or above: No action needed, pain <4.

## 2014-07-18 LAB — HELICOBACTER PYLORI SCREEN-BIOPSY: UREASE: NEGATIVE

## 2014-08-08 ENCOUNTER — Ambulatory Visit: Payer: Medicare Other | Admitting: Internal Medicine

## 2014-08-30 ENCOUNTER — Ambulatory Visit: Payer: Medicare Other | Admitting: Internal Medicine

## 2014-09-04 ENCOUNTER — Ambulatory Visit: Payer: Medicare Other | Admitting: Internal Medicine

## 2014-09-23 ENCOUNTER — Other Ambulatory Visit: Payer: Self-pay | Admitting: Family Medicine

## 2014-10-18 ENCOUNTER — Telehealth: Payer: Self-pay

## 2014-10-18 DIAGNOSIS — K2100 Gastro-esophageal reflux disease with esophagitis, without bleeding: Secondary | ICD-10-CM

## 2014-10-18 DIAGNOSIS — K253 Acute gastric ulcer without hemorrhage or perforation: Secondary | ICD-10-CM

## 2014-10-18 DIAGNOSIS — K222 Esophageal obstruction: Secondary | ICD-10-CM

## 2014-10-18 DIAGNOSIS — R1314 Dysphagia, pharyngoesophageal phase: Secondary | ICD-10-CM

## 2014-10-18 MED ORDER — OMEPRAZOLE 40 MG PO CPDR: 40.0000 mg | DELAYED_RELEASE_CAPSULE | Freq: Every day | ORAL | Status: DC

## 2014-10-18 NOTE — Telephone Encounter (Signed)
Refilled Omeprazole 

## 2014-10-22 ENCOUNTER — Encounter: Payer: Self-pay | Admitting: Family

## 2014-10-23 ENCOUNTER — Other Ambulatory Visit (HOSPITAL_COMMUNITY): Payer: Medicare Other

## 2014-10-23 ENCOUNTER — Encounter: Payer: Self-pay | Admitting: Family

## 2014-10-23 ENCOUNTER — Ambulatory Visit (INDEPENDENT_AMBULATORY_CARE_PROVIDER_SITE_OTHER): Payer: Medicare Other | Admitting: Family

## 2014-10-23 ENCOUNTER — Ambulatory Visit: Payer: Medicare Other | Admitting: Family

## 2014-10-23 ENCOUNTER — Ambulatory Visit (HOSPITAL_COMMUNITY)
Admission: RE | Admit: 2014-10-23 | Discharge: 2014-10-23 | Disposition: A | Discharge status: Home / Self Care | Payer: Medicare Other | Source: Ambulatory Visit | Attending: Family | Admitting: Family

## 2014-10-23 VITALS — BP 129/80 | HR 72 | Temp 97.5°F | Resp 16 | Ht 74.0 in | Wt 202.0 lb

## 2014-10-23 DIAGNOSIS — I6523 Occlusion and stenosis of bilateral carotid arteries: Secondary | ICD-10-CM | POA: Diagnosis not present

## 2014-10-23 DIAGNOSIS — Z9889 Other specified postprocedural states: Secondary | ICD-10-CM | POA: Diagnosis not present

## 2014-10-23 DIAGNOSIS — Z48812 Encounter for surgical aftercare following surgery on the circulatory system: Secondary | ICD-10-CM

## 2014-10-23 NOTE — Patient Instructions (Signed)
Stroke Prevention Some medical conditions and behaviors are associated with an increased chance of having a stroke. You may prevent a stroke by making healthy choices and managing medical conditions. HOW CAN I REDUCE MY RISK OF HAVING A STROKE?   Stay physically active. Get at least 30 minutes of activity on most or all days.  Do not smoke. It may also be helpful to avoid exposure to secondhand smoke.  Limit alcohol use. Moderate alcohol use is considered to be:  No more than 2 drinks per day for men.  No more than 1 drink per day for nonpregnant women.  Eat healthy foods. This involves:  Eating 5 or more servings of fruits and vegetables a day.  Making dietary changes that address high blood pressure (hypertension), high cholesterol, diabetes, or obesity.  Manage your cholesterol levels.  Making food choices that are high in fiber and low in saturated fat, trans fat, and cholesterol may control cholesterol levels.  Take any prescribed medicines to control cholesterol as directed by your health care provider.  Manage your diabetes.  Controlling your carbohydrate and sugar intake is recommended to manage diabetes.  Take any prescribed medicines to control diabetes as directed by your health care provider.  Control your hypertension.  Making food choices that are low in salt (sodium), saturated fat, trans fat, and cholesterol is recommended to manage hypertension.  Take any prescribed medicines to control hypertension as directed by your health care provider.  Maintain a healthy weight.  Reducing calorie intake and making food choices that are low in sodium, saturated fat, trans fat, and cholesterol are recommended to manage weight.  Stop drug abuse.  Avoid taking birth control pills.  Talk to your health care provider about the risks of taking birth control pills if you are over 35 years old, smoke, get migraines, or have ever had a blood clot.  Get evaluated for sleep  disorders (sleep apnea).  Talk to your health care provider about getting a sleep evaluation if you snore a lot or have excessive sleepiness.  Take medicines only as directed by your health care provider.  For some people, aspirin or blood thinners (anticoagulants) are helpful in reducing the risk of forming abnormal blood clots that can lead to stroke. If you have the irregular heart rhythm of atrial fibrillation, you should be on a blood thinner unless there is a good reason you cannot take them.  Understand all your medicine instructions.  Make sure that other conditions (such as anemia or atherosclerosis) are addressed. SEEK IMMEDIATE MEDICAL CARE IF:   You have sudden weakness or numbness of the face, arm, or leg, especially on one side of the body.  Your face or eyelid droops to one side.  You have sudden confusion.  You have trouble speaking (aphasia) or understanding.  You have sudden trouble seeing in one or both eyes.  You have sudden trouble walking.  You have dizziness.  You have a loss of balance or coordination.  You have a sudden, severe headache with no known cause.  You have new chest pain or an irregular heartbeat. Any of these symptoms may represent a serious problem that is an emergency. Do not wait to see if the symptoms will go away. Get medical help at once. Call your local emergency services (911 in [place] Do not drive yourself to the hospital. Document Released: 03/04/2004 Document Revised: 06/11/2013 Document Reviewed: 07/28/2012 [employer] Patient Information 2015 [employer]. This information is not intended to replace advice given   to you by your health care provider. Make sure you discuss any questions you have with your health care provider.  

## 2014-10-23 NOTE — Progress Notes (Signed)
Established Carotid Patient   History of Present Illness  Eduardo Shaw is a 69 y.o. male patient of Dr. Scot Dock who returns for scheduled carotid arteries surveillance s/p right CEA on 08/03/2011 by Dr. Scot Dock.  Patient has no history of TIA or stroke symptoms. Specifically the patient denies a history of amaurosis fugax or monocular blindness, unilateral facial drooping, hemiplegia,  or receptive or expressive aphasia.   He denies claudication symptoms in his legs with walking. Patient reports New Medical or Surgical History: dysphagia, had an endoscopy, normal results per pt.   Pt Diabetic: No  Pt smoker: non-smoker   Pt meds include:  Statin : Yes  Betablocker: No  ASA: Yes, 325 mg daily.  Other anticoagulants/antiplatelets: none    Past Medical History  Diagnosis Date  . Hypercholesterolemia   . Arthritis of foot   . Skin cancer   . Lactose intolerance   . Carotid artery occlusion   . Stroke June 2013    Mini   . GERD (gastroesophageal reflux disease)     erosive esophagitis  . Esophageal stricture   . Colon polyps     adenomatous  . Diverticulosis     Social History Social History  Substance Use Topics  . Smoking status: Never Smoker   . Smokeless tobacco: Never Used  . Alcohol Use: 0.0 oz/week    0 Standard drinks or equivalent per week     Comment: 1-2 drinks on weekends    Family History Family History  Problem Relation Age of Onset  . Lupus Mother   . Stroke Mother   . Lupus Sister   . Colon cancer Neg Hx   . Colon polyps Neg Hx   . Rectal cancer Neg Hx   . Stomach cancer Neg Hx     Surgical History Past Surgical History  Procedure Laterality Date  . Mohs surgery      to face  . Endarterectomy  08/03/2011    Procedure: ENDARTERECTOMY CAROTID;  Surgeon: Angelia Mould, MD;  Location: [facility] OR;  Service: Vascular;  Laterality: Right;  . Carotid endarterectomy Right August 03, 2011    CE  . Tonsillectomy    . Lumbar  laminectomy/decompression microdiscectomy Bilateral 02/23/2014    Procedure: BILATERAL LUMBAR THREE-FOUR LAMINOTOMY AND MICRODISCECTOMY WITH RIGHT LUMBAR FOUR-FIVE LAMINOTOMY AND MICRODISCECTOMY;  Surgeon: Hosie Spangle, MD;  Location: [place];  Service: Neurosurgery;  Laterality: Bilateral;  Bilateral L34 laminotomy with Left L34 microdiskectomy and possible Right L34 microdiskectomy and Right L45 Laminotomy and foraminotomy  . Colonoscopy      [place]eras ago [place], normal per pt    Allergies  Allergen Reactions  . Morphine And Related Nausea And Vomiting    Gi upset  . Other     Bee sting - swelling in the areas stung    Current Outpatient Prescriptions  Medication Sig Dispense Refill  . aspirin EC 325 MG tablet Take 325 mg by mouth daily.    Marland Kitchen atorvastatin (LIPITOR) 40 MG tablet TAKE 1 TABLET BY MOUTH EVERY DAY AT 6PM 100 tablet 3  . ibuprofen (ADVIL,MOTRIN) 200 MG tablet Take 200 mg by mouth every 6 (six) hours as needed.    Marland Kitchen omeprazole (PRILOSEC) 40 MG capsule Take 1 capsule (40 mg total) by mouth daily. 30 capsule 11  . atorvastatin (LIPITOR) 40 MG tablet TAKE 1 TABLET BY MOUTH EVERY DAY AT 6PM (Patient not taking: Reported on 10/23/2014) 90 tablet 2   No current facility-administered medications  for this visit.    Review of Systems : See HPI for pertinent positives and negatives.  Physical Examination  Filed Vitals:   10/23/14 1308 10/23/14 1309  BP: 132/82 129/80  Pulse: 72 72  Temp: 97.5 F (36.4 C)   Resp: 16   Height: 6\' 2"  (1.88 m)   Weight: 202 lb (91.627 kg)   SpO2: 95%    Body mass index is 25.92 kg/(m^2).  General: WDWN male in NAD  GAIT: normal  Eyes: PERRLA  Pulmonary: CTAB, no ales, no rhonchi, & no wheezing.  Cardiac: regular rhythm, no detected murmur.   VASCULAR EXAM  Carotid Bruits  Left  Right    Negative  Negative    LE Pulses  LEFT  RIGHT   POPLITEAL  not palpable  not palpable   POSTERIOR TIBIAL  not palpable   not palpable   DORSALIS PEDIS  ANTERIOR TIBIAL  palpable  palpable    Gastrointestinal: soft, nontender, BS WNL, no r/g, no palpable masses.  Musculoskeletal: No muscle atrophy/wasting. M/S 5/5 throughout, Extremities without ischemic changes.  Neurologic: A&O X 3; Appropriate Affect, Speech is normal  CN 2-12 intact, Pain and light touch intact in extremities, motor exam as listed above.           Non-Invasive Vascular Imaging CAROTID DUPLEX 10/23/2014   CEREBROVASCULAR DUPLEX EVALUATION    INDICATION: Carotid artery disease    PREVIOUS INTERVENTION(S): Right carotid endarterectomy 08/03/2011    DUPLEX EXAM: Carotid duplex    RIGHT  LEFT  Peak Systolic Velocities (cm/s) End Diastolic Velocities (cm/s) Plaque LOCATION Peak Systolic Velocities (cm/s) End Diastolic Velocities (cm/s) Plaque  111 14 - CCA PROXIMAL 116 24 -  98 18 - CCA MID 103 25 -  111 16 - CCA DISTAL 100 29 HT  111 11 - ECA 196 21 HT  38 8 HM ICA PROXIMAL 137 40 HT  78 23 - ICA MID 137 34 -  74 24 - ICA DISTAL 95 38 -    N/A ICA / CCA Ratio (PSV) 1.3  Antegrade Vertebral Flow Antegrade  - Brachial Systolic Pressure (mmHg) -  Triphasic Brachial Artery Waveforms Triphasic    Plaque Morphology:  HM = Homogeneous, HT = Heterogeneous, CP = Calcific Plaque, SP = Smooth Plaque, IP = Irregular Plaque  ADDITIONAL FINDINGS: Significant plaque noted in the left internal carotid artery    IMPRESSION: 1. Patent right carotid endarterectomy site with no evidence for restenosis. 2. 1 to 39%   left carotid artery stenosis, upper end of range.    Compared to the previous exam:  The left internal carotid artery velocity in range with prior exam 10/11/2012      Assessment: LORN BUTCHER is a 69 y.o. male who is s/p right CEA on 08/03/2011.  He has no history of stroke or TIA.   Today's carotid Duplex suggests a patent right carotid endarterectomy site with no evidence for restenosis and 1 to 39%  left carotid  artery stenosis, upper end of range. The left internal carotid artery velocity is in range with prior exam on 10/11/2012.   Plan: Follow-up in 1 year with Carotid Duplex.   I discussed in depth with the patient the nature of atherosclerosis, and emphasized the importance of maximal medical management including strict control of blood pressure, blood glucose, and lipid levels, obtaining regular exercise, and continued cessation of smoking.  The patient is aware that without maximal medical management the underlying atherosclerotic disease process will progress, limiting  the benefit of any interventions. The patient was given information about stroke prevention and what symptoms should prompt the patient to seek immediate medical care. Thank you for allowing [place] to participate in this patient's care.  Clemon Chambers, RN, MSN, FNP-C [facility] Office: [phone]  Clinic Physician: Scot Dock  10/23/2014 1:20 PM

## 2014-10-25 NOTE — Addendum Note (Signed)
Addended by: Dorthula Rue L on: 10/25/2014 03:47 PM   Modules accepted: Orders

## 2014-11-06 ENCOUNTER — Encounter: Payer: Self-pay | Admitting: Internal Medicine

## 2014-11-06 ENCOUNTER — Ambulatory Visit (INDEPENDENT_AMBULATORY_CARE_PROVIDER_SITE_OTHER): Payer: Medicare Other | Admitting: Internal Medicine

## 2014-11-06 VITALS — BP 150/80 | HR 72 | Ht 74.0 in | Wt 206.8 lb

## 2014-11-06 DIAGNOSIS — R1310 Dysphagia, unspecified: Secondary | ICD-10-CM

## 2014-11-06 DIAGNOSIS — K222 Esophageal obstruction: Secondary | ICD-10-CM | POA: Diagnosis not present

## 2014-11-06 DIAGNOSIS — K219 Gastro-esophageal reflux disease without esophagitis: Secondary | ICD-10-CM | POA: Diagnosis not present

## 2014-11-06 NOTE — Progress Notes (Signed)
HISTORY OF PRESENT ILLNESS:  Eduardo Shaw is a 69 y.o. male who was referred for screening colonoscopy 07/09/2014. He was found to have multiple adenomatous colon polyps and moderate left-sided diverticulosis. Follow-up colonoscopy in 3 years recommended. At that time he complained of intermittent solid food dysphagia 1 year. Thus, upper endoscopy was performed 07/15/2014. He was found to have GERD with erosive esophagitis and peptic stricture which was dilated by a 107 [place] dilator. He did have antral erosions. Biopsies were negative for Helicobacter pylori. He was placed on omeprazole 40 mg daily and scheduled for this follow-up. Patient denies antecedent history of GERD symptoms. He has been compliant with omeprazole. Fortunately, he continues to have intermittent solid food dysphagia despite his medication and dilation. He feels that it is unchanged from previous. No additional GI complaints except for belching.  REVIEW OF SYSTEMS:  All non-GI ROS negative upper comprehensive review  Past Medical History  Diagnosis Date  . Hypercholesterolemia   . Arthritis of foot   . Skin cancer   . Lactose intolerance   . Carotid artery occlusion   . Stroke June 2013    Mini   . GERD (gastroesophageal reflux disease)     erosive esophagitis  . Esophageal stricture   . Colon polyps     adenomatous  . Diverticulosis     Past Surgical History  Procedure Laterality Date  . Mohs surgery      to face  . Endarterectomy  08/03/2011    Procedure: ENDARTERECTOMY CAROTID;  Surgeon: Angelia Mould, MD;  Location: [facility] OR;  Service: Vascular;  Laterality: Right;  . Carotid endarterectomy Right August 03, 2011    CE  . Tonsillectomy    . Lumbar laminectomy/decompression microdiscectomy Bilateral 02/23/2014    Procedure: BILATERAL LUMBAR THREE-FOUR LAMINOTOMY AND MICRODISCECTOMY WITH RIGHT LUMBAR FOUR-FIVE LAMINOTOMY AND MICRODISCECTOMY;  Surgeon: Hosie Spangle, MD;  Location: [place];   Service: Neurosurgery;  Laterality: Bilateral;  Bilateral L34 laminotomy with Left L34 microdiskectomy and possible Right L34 microdiskectomy and Right L45 Laminotomy and foraminotomy  . Colonoscopy      12 yeras ago [place], normal per pt    Social History Eduardo Shaw  reports that he has never smoked. He has never used smokeless tobacco. He reports that he drinks alcohol. He reports that he does not use illicit drugs.  family history includes Lupus in his mother and sister; Stroke in his mother. There is no history of Colon cancer, Colon polyps, Rectal cancer, Stomach cancer, Esophageal cancer, Pancreatic cancer, Liver disease, or Kidney disease.  Allergies  Allergen Reactions  . Morphine And Related Nausea And Vomiting    Gi upset  . Other     Bee sting - swelling in the areas stung       PHYSICAL EXAMINATION: Vital signs: BP 150/80 mmHg  Pulse 72  Ht 6\' 2"  (1.88 m)  Wt 206 lb 12.8 oz (93.804 kg)  BMI 26.54 kg/m2 General: Well-developed, well-nourished, no acute distress HEENT: Sclerae are anicteric, conjunctiva pink. Oral mucosa intact Lungs: Clear Heart: Regular Abdomen: soft, nontender, nondistended, no obvious ascites, no peritoneal signs, normal bowel sounds. No organomegaly. Extremities: No clubbing cyanosis or edema Psychiatric: alert and oriented x3. Cooperative     ASSESSMENT:  #1. GERD complicated by erosive esophagitis and peptic stricture. Ongoing dysphagia despite recent esophageal dilation and initiation of omeprazole therapy #2. History of multiple adenomatous.  PLAN:  #1. Reflux precautions #2. Continue omeprazole 40 mg daily #3.  Repeat upper endoscopy to assess for mucosal healing on current dose of medication. As well, likely repeat dilation with larger dilator and attempts to relieve dysphagia.The nature of the procedure, as well as the risks, benefits, and alternatives were carefully and thoroughly reviewed with the patient. Ample time  for discussion and questions allowed. The patient understood, was satisfied, and agreed to proceed. #4. Surveillance colonoscopy 3 years. Patient aware

## 2014-11-06 NOTE — Patient Instructions (Signed)
You have been scheduled for an endoscopy. Please follow written instructions given to you at your visit today. If you use inhalers (even only as needed), please bring them with you on the day of your procedure.   

## 2014-12-13 ENCOUNTER — Encounter: Payer: Self-pay | Admitting: Internal Medicine

## 2014-12-26 ENCOUNTER — Encounter: Payer: Self-pay | Admitting: Internal Medicine

## 2015-01-22 ENCOUNTER — Encounter: Payer: Self-pay | Admitting: Internal Medicine

## 2015-01-29 DIAGNOSIS — L570 Actinic keratosis: Secondary | ICD-10-CM | POA: Diagnosis not present

## 2015-01-29 DIAGNOSIS — C44319 Basal cell carcinoma of skin of other parts of face: Secondary | ICD-10-CM | POA: Diagnosis not present

## 2015-02-27 DIAGNOSIS — C44319 Basal cell carcinoma of skin of other parts of face: Secondary | ICD-10-CM | POA: Diagnosis not present

## 2015-05-13 ENCOUNTER — Telehealth: Payer: Self-pay | Admitting: Internal Medicine

## 2015-05-13 NOTE — Telephone Encounter (Signed)
Patient states he needs a refill on omeprazole but wants to make sure that he needs to continue this medication. Informed patient that he needs to continue omeprazole even if he his symptoms have resolved. Explained to patient that his symptoms could return if he stops taking his medication. Patient verbalized understanding. Patient states he orders his medications online and does not need for [place] to send a prescription.

## 2015-07-01 ENCOUNTER — Other Ambulatory Visit: Payer: Self-pay | Admitting: Family Medicine

## 2015-08-27 DIAGNOSIS — L570 Actinic keratosis: Secondary | ICD-10-CM | POA: Diagnosis not present

## 2015-08-27 DIAGNOSIS — Z85828 Personal history of other malignant neoplasm of skin: Secondary | ICD-10-CM | POA: Diagnosis not present

## 2015-08-27 DIAGNOSIS — L821 Other seborrheic keratosis: Secondary | ICD-10-CM | POA: Diagnosis not present

## 2015-09-11 DIAGNOSIS — H524 Presbyopia: Secondary | ICD-10-CM | POA: Diagnosis not present

## 2015-09-11 DIAGNOSIS — H2513 Age-related nuclear cataract, bilateral: Secondary | ICD-10-CM | POA: Diagnosis not present

## 2015-09-23 ENCOUNTER — Telehealth: Payer: Self-pay | Admitting: Internal Medicine

## 2015-09-23 DIAGNOSIS — K222 Esophageal obstruction: Secondary | ICD-10-CM

## 2015-09-23 DIAGNOSIS — R1314 Dysphagia, pharyngoesophageal phase: Secondary | ICD-10-CM

## 2015-09-23 DIAGNOSIS — K253 Acute gastric ulcer without hemorrhage or perforation: Secondary | ICD-10-CM

## 2015-09-23 DIAGNOSIS — K2100 Gastro-esophageal reflux disease with esophagitis, without bleeding: Secondary | ICD-10-CM

## 2015-09-23 MED ORDER — OMEPRAZOLE 40 MG PO CPDR: 40.0000 mg | DELAYED_RELEASE_CAPSULE | Freq: Every day | ORAL | 1 refills | Status: DC

## 2015-09-23 NOTE — Telephone Encounter (Signed)
Sent refill of Omeprazole to [facility] per patient's request

## 2015-10-21 ENCOUNTER — Other Ambulatory Visit: Payer: Self-pay | Admitting: Internal Medicine

## 2015-10-21 DIAGNOSIS — K2100 Gastro-esophageal reflux disease with esophagitis, without bleeding: Secondary | ICD-10-CM

## 2015-10-21 DIAGNOSIS — L570 Actinic keratosis: Secondary | ICD-10-CM | POA: Diagnosis not present

## 2015-10-21 DIAGNOSIS — K253 Acute gastric ulcer without hemorrhage or perforation: Secondary | ICD-10-CM

## 2015-10-21 DIAGNOSIS — K222 Esophageal obstruction: Secondary | ICD-10-CM

## 2015-10-21 DIAGNOSIS — R1314 Dysphagia, pharyngoesophageal phase: Secondary | ICD-10-CM

## 2015-10-23 ENCOUNTER — Encounter: Payer: Self-pay | Admitting: Family

## 2015-10-29 ENCOUNTER — Encounter: Payer: Self-pay | Admitting: Family

## 2015-10-29 ENCOUNTER — Ambulatory Visit (INDEPENDENT_AMBULATORY_CARE_PROVIDER_SITE_OTHER): Payer: Medicare Other | Admitting: Family

## 2015-10-29 ENCOUNTER — Ambulatory Visit (HOSPITAL_COMMUNITY)
Admission: RE | Admit: 2015-10-29 | Discharge: 2015-10-29 | Disposition: A | Discharge status: Home / Self Care | Payer: Medicare Other | Source: Ambulatory Visit | Attending: Vascular Surgery | Admitting: Vascular Surgery

## 2015-10-29 VITALS — BP 138/82 | HR 68 | Temp 97.1°F | Resp 16 | Ht 74.0 in | Wt 200.0 lb

## 2015-10-29 DIAGNOSIS — Z9889 Other specified postprocedural states: Secondary | ICD-10-CM

## 2015-10-29 DIAGNOSIS — I6522 Occlusion and stenosis of left carotid artery: Secondary | ICD-10-CM | POA: Diagnosis not present

## 2015-10-29 DIAGNOSIS — Z48812 Encounter for surgical aftercare following surgery on the circulatory system: Secondary | ICD-10-CM | POA: Diagnosis not present

## 2015-10-29 DIAGNOSIS — I6521 Occlusion and stenosis of right carotid artery: Secondary | ICD-10-CM

## 2015-10-29 LAB — VAS US CAROTID
LCCADSYS: -85 cm/s
LCCAPDIAS: 20 cm/s
LEFT ECA DIAS: -21 cm/s
LEFT VERTEBRAL DIAS: 14 cm/s
LICADSYS: -78 cm/s
LICAPDIAS: -28 cm/s
Left CCA dist dias: -22 cm/s
Left CCA prox sys: 107 cm/s
Left ICA dist dias: -22 cm/s
Left ICA prox sys: -99 cm/s
RCCAPDIAS: 17 cm/s
RCCAPSYS: 100 cm/s
RIGHT CCA MID DIAS: 18 cm/s
RIGHT ECA DIAS: -10 cm/s
RIGHT VERTEBRAL DIAS: 16 cm/s
Right cca dist sys: -66 cm/s

## 2015-10-29 NOTE — Patient Instructions (Signed)
Stroke Prevention Some medical conditions and behaviors are associated with an increased chance of having a stroke. You may prevent a stroke by making healthy choices and managing medical conditions. HOW CAN I REDUCE MY RISK OF HAVING A STROKE?   Stay physically active. Get at least 30 minutes of activity on most or all days.  Do not smoke. It may also be helpful to avoid exposure to secondhand smoke.  Limit alcohol use. Moderate alcohol use is considered to be:  No more than 2 drinks per day for men.  No more than 1 drink per day for nonpregnant women.  Eat healthy foods. This involves:  Eating 5 or more servings of fruits and vegetables a day.  Making dietary changes that address high blood pressure (hypertension), high cholesterol, diabetes, or obesity.  Manage your cholesterol levels.  Making food choices that are high in fiber and low in saturated fat, trans fat, and cholesterol may control cholesterol levels.  Take any prescribed medicines to control cholesterol as directed by your health care provider.  Manage your diabetes.  Controlling your carbohydrate and sugar intake is recommended to manage diabetes.  Take any prescribed medicines to control diabetes as directed by your health care provider.  Control your hypertension.  Making food choices that are low in salt (sodium), saturated fat, trans fat, and cholesterol is recommended to manage hypertension.  Ask your health care provider if you need treatment to lower your blood pressure. Take any prescribed medicines to control hypertension as directed by your health care provider.  If you are 18-39 years of age, have your blood pressure checked every 3-5 years. If you are 40 years of age or older, have your blood pressure checked every year.  Maintain a healthy weight.  Reducing calorie intake and making food choices that are low in sodium, saturated fat, trans fat, and cholesterol are recommended to manage  weight.  Stop drug abuse.  Avoid taking birth control pills.  Talk to your health care provider about the risks of taking birth control pills if you are over 35 years old, smoke, get migraines, or have ever had a blood clot.  Get evaluated for sleep disorders (sleep apnea).  Talk to your health care provider about getting a sleep evaluation if you snore a lot or have excessive sleepiness.  Take medicines only as directed by your health care provider.  For some people, aspirin or blood thinners (anticoagulants) are helpful in reducing the risk of forming abnormal blood clots that can lead to stroke. If you have the irregular heart rhythm of atrial fibrillation, you should be on a blood thinner unless there is a good reason you cannot take them.  Understand all your medicine instructions.  Make sure that other conditions (such as anemia or atherosclerosis) are addressed. SEEK IMMEDIATE MEDICAL CARE IF:   You have sudden weakness or numbness of the face, arm, or leg, especially on one side of the body.  Your face or eyelid droops to one side.  You have sudden confusion.  You have trouble speaking (aphasia) or understanding.  You have sudden trouble seeing in one or both eyes.  You have sudden trouble walking.  You have dizziness.  You have a loss of balance or coordination.  You have a sudden, severe headache with no known cause.  You have new chest pain or an irregular heartbeat. Any of these symptoms may represent a serious problem that is an emergency. Do not wait to see if the symptoms will   go away. Get medical help at once. Call your local emergency services (911 in [place] Do not drive yourself to the hospital.   This information is not intended to replace advice given to you by your health care provider. Make sure you discuss any questions you have with your health care provider.   Document Released: 03/04/2004 Document Revised: 02/15/2014 Document Reviewed:  07/28/2012 Elsevier Interactive Patient Education 2016 Elsevier Inc.  

## 2015-10-29 NOTE — Progress Notes (Signed)
Chief Complaint: Follow up Extracranial Carotid Artery Stenosis   History of Present Illness  Eduardo Shaw is a 70 y.o. male patient of Dr. Scot Dock who returns for scheduled carotid arteries surveillance s/p right CEA on 08/03/2011 by Dr. Scot Dock.  Patient has no history of TIA or stroke symptoms. Specifically the patient denies a history of amaurosis fugax or monocular blindness, unilateral facial drooping, hemiplegia,  or receptive or expressive aphasia.   He denies claudication symptoms in his legs with walking. Patient reports New Medical or Surgical History: dysphagia, had an endoscopy, normal results per pt.   Pt Diabetic: No  Pt smoker: non-smoker   Pt meds include:  Statin : Yes  Betablocker: No  ASA: Yes, 325 mg daily.  Other anticoagulants/antiplatelets: none     Past Medical History:  Diagnosis Date  . Arthritis of foot   . Carotid artery occlusion   . Colon polyps    adenomatous  . Diverticulosis   . Esophageal stricture   . GERD (gastroesophageal reflux disease)    erosive esophagitis  . Hypercholesterolemia   . Lactose intolerance   . Skin cancer   . Stroke [facility]) June 2013   Mini     Social History Social History  Substance Use Topics  . Smoking status: Never Smoker  . Smokeless tobacco: Never Used  . Alcohol use 0.0 oz/week     Comment: 1-2 drinks on weekends    Family History Family History  Problem Relation Age of Onset  . Lupus Mother   . Stroke Mother   . Lupus Sister   . Colon cancer Neg Hx   . Colon polyps Neg Hx   . Rectal cancer Neg Hx   . Stomach cancer Neg Hx   . Esophageal cancer Neg Hx   . Pancreatic cancer Neg Hx   . Liver disease Neg Hx   . Kidney disease Neg Hx     Surgical History Past Surgical History:  Procedure Laterality Date  . CAROTID ENDARTERECTOMY Right August 03, 2011   CE  . COLONOSCOPY     12 yeras ago [place], normal per pt  . ENDARTERECTOMY  08/03/2011   Procedure: ENDARTERECTOMY  CAROTID;  Surgeon: Angelia Mould, MD;  Location: [facility];  Service: Vascular;  Laterality: Right;  . LUMBAR LAMINECTOMY/DECOMPRESSION MICRODISCECTOMY Bilateral 02/23/2014   Procedure: BILATERAL LUMBAR THREE-FOUR LAMINOTOMY AND MICRODISCECTOMY WITH RIGHT LUMBAR FOUR-FIVE LAMINOTOMY AND MICRODISCECTOMY;  Surgeon: Hosie Spangle, MD;  Location: [facility];  Service: Neurosurgery;  Laterality: Bilateral;  Bilateral L34 laminotomy with Left L34 microdiskectomy and possible Right L34 microdiskectomy and Right L45 Laminotomy and foraminotomy  . MOHS SURGERY     to face  . TONSILLECTOMY      Allergies  Allergen Reactions  . Morphine And Related Nausea And Vomiting    Gi upset  . Other     Bee sting - swelling in the areas stung    Current Outpatient Prescriptions  Medication Sig Dispense Refill  . aspirin EC 325 MG tablet Take 325 mg by mouth daily.    Marland Kitchen atorvastatin (LIPITOR) 40 MG tablet Take 1 tablet by mouth  every day at 6 pm 90 tablet 1  . ibuprofen (ADVIL,MOTRIN) 200 MG tablet Take 200 mg by mouth every 6 (six) hours as needed.    Marland Kitchen omeprazole (PRILOSEC) 40 MG capsule Take 1 capsule (40 mg total) by mouth daily. 30 capsule 1  . omeprazole (PRILOSEC) 40 MG capsule Take 1 capsule by mouth  daily 90 capsule 1   No current facility-administered medications for this visit.     Review of Systems : See HPI for pertinent positives and negatives.  Physical Examination  Vitals:   10/29/15 1323 10/29/15 1326  BP: 122/77 138/82  Pulse: 68   Resp: 16   Temp: 97.1 F (36.2 C)   TempSrc: Oral   SpO2: 95%   Weight: 200 lb (90.7 kg)   Height: 6\' 2"  (1.88 m)    Body mass index is 25.68 kg/m.  General: WDWN male in NAD  GAIT: normal  Eyes: PERRLA  Pulmonary: Respirations are non labored, CTAB, good air movement in all fields.  Cardiac: regular rhythm and rate, no detected murmur.   VASCULAR EXAM  Carotid Bruits  Left  Right    Negative  Negative    LE Pulses  LEFT   RIGHT   POPLITEAL  not palpable  not palpable   POSTERIOR TIBIAL   palpable   palpable   DORSALIS PEDIS  ANTERIOR TIBIAL  palpable  palpable    Gastrointestinal: soft, nontender, BS WNL, no r/g, no palpable masses.  Musculoskeletal: No muscle atrophy/wasting. M/S 5/5 throughout, Extremities without ischemic changes.  Neurologic: A&O X 3; Appropriate Affect, Speech is normal  CN 2-12 intact, Pain and light touch intact in extremities, motor exam as listed above.    Assessment: Eduardo Shaw is a 70 y.o. male who is s/p right CEA on 08/03/2011.  He has no history of stroke or TIA.  Fortunately he does not have DM and has never used tobacco. He takes a daily ASA and a statin.   DATA Today's carotid Duplex suggests a patent right carotid endarterectomy site with no evidence for restenosis, and 1 - 39%  left internal carotid artery stenosis, upper end of range. No significant change compared to the exam of 10/23/14.   Plan: Follow-up in 1 year with Carotid Duplex scan.   I discussed in depth with the patient the nature of atherosclerosis, and emphasized the importance of maximal medical management including strict control of blood pressure, blood glucose, and lipid levels, obtaining regular exercise, and continued cessation of smoking.  The patient is aware that without maximal medical management the underlying atherosclerotic disease process will progress, limiting the benefit of any interventions. The patient was given information about stroke prevention and what symptoms should prompt the patient to seek immediate medical care. Thank you for allowing [place] to participate in this patient's care.  Clemon Chambers, RN, MSN, FNP-C [facility] Office: [phone]  Clinic Physician: Rudie Meyer  10/29/15 2:01 PM

## 2016-01-12 ENCOUNTER — Other Ambulatory Visit (INDEPENDENT_AMBULATORY_CARE_PROVIDER_SITE_OTHER): Payer: Medicare Other

## 2016-01-12 DIAGNOSIS — Z0000 Encounter for general adult medical examination without abnormal findings: Secondary | ICD-10-CM

## 2016-01-12 LAB — PSA: PSA: 2.37 ng/mL (ref 0.10–4.00)

## 2016-01-12 LAB — CBC WITH DIFFERENTIAL/PLATELET
BASOS ABS: 0 10*3/uL (ref 0.0–0.1)
Basophils Relative: 0.4 % (ref 0.0–3.0)
EOS PCT: 5.2 % — AB (ref 0.0–5.0)
Eosinophils Absolute: 0.4 10*3/uL (ref 0.0–0.7)
HCT: 41.4 % (ref 39.0–52.0)
HEMOGLOBIN: 14 g/dL (ref 13.0–17.0)
LYMPHS ABS: 2.1 10*3/uL (ref 0.7–4.0)
Lymphocytes Relative: 28.1 % (ref 12.0–46.0)
MCHC: 33.7 g/dL (ref 30.0–36.0)
MCV: 91.8 fl (ref 78.0–100.0)
MONO ABS: 0.7 10*3/uL (ref 0.1–1.0)
MONOS PCT: 8.9 % (ref 3.0–12.0)
NEUTROS PCT: 57.4 % (ref 43.0–77.0)
Neutro Abs: 4.3 10*3/uL (ref 1.4–7.7)
Platelets: 182 10*3/uL (ref 150.0–400.0)
RBC: 4.51 Mil/uL (ref 4.22–5.81)
RDW: 13.2 % (ref 11.5–15.5)
WBC: 7.4 10*3/uL (ref 4.0–10.5)

## 2016-01-12 LAB — POC URINALSYSI DIPSTICK (AUTOMATED)
BILIRUBIN UA: NEGATIVE
Blood, UA: NEGATIVE
GLUCOSE UA: NEGATIVE
KETONES UA: NEGATIVE
LEUKOCYTES UA: NEGATIVE
NITRITE UA: NEGATIVE
Protein, UA: NEGATIVE
Spec Grav, UA: 1.02
Urobilinogen, UA: 0.2
pH, UA: 5

## 2016-01-12 LAB — LIPID PANEL
Cholesterol: 140 mg/dL (ref 0–200)
HDL: 48.4 mg/dL (ref >39.00)
LDL CALC: 78 mg/dL (ref 0–99)
NONHDL: 91.86
Total CHOL/HDL Ratio: 3
Triglycerides: 70 mg/dL (ref 0.0–149.0)
VLDL: 14 mg/dL (ref 0.0–40.0)

## 2016-01-12 LAB — HEPATIC FUNCTION PANEL
ALK PHOS: 73 U/L (ref 39–117)
ALT: 14 U/L (ref 0–53)
AST: 18 U/L (ref 0–37)
Albumin: 3.7 g/dL (ref 3.5–5.2)
BILIRUBIN TOTAL: 0.6 mg/dL (ref 0.2–1.2)
Bilirubin, Direct: 0.1 mg/dL (ref 0.0–0.3)
Total Protein: 6.1 g/dL (ref 6.0–8.3)

## 2016-01-12 LAB — TSH: TSH: 2.03 u[IU]/mL (ref 0.35–4.50)

## 2016-01-12 LAB — BASIC METABOLIC PANEL
BUN: 24 mg/dL — ABNORMAL HIGH (ref 6–23)
CALCIUM: 8.9 mg/dL (ref 8.4–10.5)
CO2: 30 mEq/L (ref 19–32)
Chloride: 109 mEq/L (ref 96–112)
Creatinine, Ser: 1.16 mg/dL (ref 0.40–1.50)
GFR: 65.85 mL/min (ref >60.00)
GLUCOSE: 106 mg/dL — AB (ref 70–99)
POTASSIUM: 4.3 meq/L (ref 3.5–5.1)
SODIUM: 145 meq/L (ref 135–145)

## 2016-01-13 NOTE — Addendum Note (Signed)
Addended by: Lianne Cure A on: 01/13/2016 01:24 PM   Modules accepted: Orders

## 2016-01-19 ENCOUNTER — Encounter: Payer: Self-pay | Admitting: Family Medicine

## 2016-01-19 ENCOUNTER — Ambulatory Visit (INDEPENDENT_AMBULATORY_CARE_PROVIDER_SITE_OTHER): Payer: Medicare Other | Admitting: Family Medicine

## 2016-01-19 VITALS — BP 130/80 | HR 65 | Temp 97.3°F | Ht 73.0 in | Wt 203.7 lb

## 2016-01-19 DIAGNOSIS — R739 Hyperglycemia, unspecified: Secondary | ICD-10-CM

## 2016-01-19 DIAGNOSIS — Z23 Encounter for immunization: Secondary | ICD-10-CM | POA: Diagnosis not present

## 2016-01-19 DIAGNOSIS — R1310 Dysphagia, unspecified: Secondary | ICD-10-CM

## 2016-01-19 DIAGNOSIS — K2100 Gastro-esophageal reflux disease with esophagitis, without bleeding: Secondary | ICD-10-CM

## 2016-01-19 DIAGNOSIS — N529 Male erectile dysfunction, unspecified: Secondary | ICD-10-CM

## 2016-01-19 DIAGNOSIS — K222 Esophageal obstruction: Secondary | ICD-10-CM

## 2016-01-19 DIAGNOSIS — E785 Hyperlipidemia, unspecified: Secondary | ICD-10-CM

## 2016-01-19 DIAGNOSIS — R1314 Dysphagia, pharyngoesophageal phase: Secondary | ICD-10-CM

## 2016-01-19 DIAGNOSIS — K253 Acute gastric ulcer without hemorrhage or perforation: Secondary | ICD-10-CM

## 2016-01-19 LAB — BASIC METABOLIC PANEL
BUN: 23 mg/dL (ref 6–23)
CO2: 30 meq/L (ref 19–32)
CREATININE: 1.2 mg/dL (ref 0.40–1.50)
Calcium: 9.2 mg/dL (ref 8.4–10.5)
Chloride: 107 mEq/L (ref 96–112)
GFR: 63.32 mL/min (ref >60.00)
GLUCOSE: 93 mg/dL (ref 70–99)
Potassium: 4.4 mEq/L (ref 3.5–5.1)
SODIUM: 144 meq/L (ref 135–145)

## 2016-01-19 LAB — HEMOGLOBIN A1C: HEMOGLOBIN A1C: 5.9 % (ref 4.6–6.5)

## 2016-01-19 MED ORDER — SILDENAFIL CITRATE 20 MG PO TABS: ORAL_TABLET | ORAL | 11 refills | Status: DC

## 2016-01-19 MED ORDER — OMEPRAZOLE 40 MG PO CPDR: 40.0000 mg | DELAYED_RELEASE_CAPSULE | Freq: Every day | ORAL | 4 refills | Status: DC

## 2016-01-19 MED ORDER — ATORVASTATIN CALCIUM 40 MG PO TABS: ORAL_TABLET | ORAL | 4 refills | Status: DC

## 2016-01-19 MED ORDER — HYDROCHLOROTHIAZIDE 12.5 MG PO CAPS: 12.5000 mg | ORAL_CAPSULE | Freq: Every day | ORAL | 4 refills | Status: DC

## 2016-01-19 NOTE — Patient Instructions (Signed)
Carbohydrate free diet........... walk 30 minutes daily......Marland Kitchen A1c today.......... follow-up A1c and blood sugar in 4 months  Call your insurance company to find out where you can get your shingles vaccine the cheapest  Continue your current indications  Hydrocort thiazide 12.5 mg..... One daily when necessary for fluid retention.

## 2016-01-19 NOTE — Progress Notes (Signed)
Eduardo Shaw is a 70 year old married male nonsmoker who comes in today for general physical evaluation because a history of hyperlipidemia reflux esophagitis and now elevation of weight and blood sugar  He takes Lipitor and aspirin daily for hyperlipidemia. Lipids are at goal  He takes Prilosec daily because of chronic reflux  Fasting blood sugar 106 A1c pending  He gets routine eye care, dental care, colonoscopy 2016 normal. He's had a polyp in the past. He'll be back in 5 years  Vaccinations up-to-date except he was given a Prevnar influenza shot today. Advised to call his insurance company to get a shingles vaccine.  He does not exercise on a daily basis  Cognitive function normal he exercises sporadically, home health safety reviewed no issues identified, no guns in the house, he does have a healthcare power of attorney and living well.  BP 130/80 (BP Location: Left Arm, Patient Position: Sitting, Cuff Size: Normal)   Pulse 65   Temp 97.3 F (36.3 C) (Oral)   Ht 6\' 1"  (1.854 m)   Wt 203 lb 11.2 oz (92.4 kg)   SpO2 96%   BMI 26.87 kg/m  Examination of the HEENT were negative except for early bilateral cataracts. Neck was supple no adenopathy scar right neck from previous carotid surgery........ this is done 4 years ago. Follow-up annually by CVT S normal exam no recurrence of the stenosis........ cardiopulmonary exam normal abdominal exam normal genitalia normal male rectum normal stool guaiac-negative prostate smooth nonnodular 1+ symmetrical BPH. Extremities normal skin normal peripheral pulses normal  #1 healthy male  #2 hyperlipidemia goal....... continue current therapy  #3 chronic reflux esophagitis.....Marland Kitchen continue Prilosec  #4 elevated blood sugar....... check A1c.... Carbohydrate free diet....... walk 30 minutes daily... Follow-up A1c and blood sugar in 3 months  #5 BPH asymptomatic  #6 peripheral edema....... avoid salt... HCTZ 12.5 mg daily when necessary  #7 status post  right carotid endarterectomy......... annual follow-up by vascular.Marland Kitchen

## 2016-01-19 NOTE — Progress Notes (Signed)
Pre visit review using our clinic review tool, if applicable. No additional management support is needed unless otherwise documented below in the visit note. 

## 2016-05-19 ENCOUNTER — Encounter: Payer: Self-pay | Admitting: Family Medicine

## 2016-05-19 ENCOUNTER — Ambulatory Visit (INDEPENDENT_AMBULATORY_CARE_PROVIDER_SITE_OTHER): Payer: Medicare Other | Admitting: Family Medicine

## 2016-05-19 VITALS — BP 138/80 | Temp 98.1°F | Wt 204.0 lb

## 2016-05-19 DIAGNOSIS — L570 Actinic keratosis: Secondary | ICD-10-CM | POA: Diagnosis not present

## 2016-05-19 DIAGNOSIS — R739 Hyperglycemia, unspecified: Secondary | ICD-10-CM | POA: Diagnosis not present

## 2016-05-19 DIAGNOSIS — C44519 Basal cell carcinoma of skin of other part of trunk: Secondary | ICD-10-CM | POA: Diagnosis not present

## 2016-05-19 LAB — BASIC METABOLIC PANEL
BUN: 30 mg/dL — AB (ref 6–23)
CALCIUM: 9.4 mg/dL (ref 8.4–10.5)
CO2: 30 mEq/L (ref 19–32)
CREATININE: 1.29 mg/dL (ref 0.40–1.50)
Chloride: 105 mEq/L (ref 96–112)
GFR: 58.2 mL/min — ABNORMAL LOW (ref >60.00)
Glucose, Bld: 100 mg/dL — ABNORMAL HIGH (ref 70–99)
Potassium: 5.1 mEq/L (ref 3.5–5.1)
Sodium: 141 mEq/L (ref 135–145)

## 2016-05-19 LAB — HEMOGLOBIN A1C: Hgb A1c MFr Bld: 5.9 % (ref 4.6–6.5)

## 2016-05-19 NOTE — Progress Notes (Signed)
Eduardo Shaw is a 72 year old married male nonsmoker who comes in today for follow-up of elevated blood sugar  We saw him 4 months ago for his physical examination. That time his blood sugar is elevated 106 A1c was normal. We prescribed diet and exercise. His weight is a same he has avoided sugar. He is walking daily  BP 138/80 (BP Location: Left Arm, Patient Position: Sitting, Cuff Size: Normal)   Temp 98.1 F (36.7 C) (Oral)   Wt 204 lb (92.5 kg)   BMI 26.91 kg/m  Well-developed well-nourished male no acute distress  #1 elevated blood sugar.......... check sugar and A1c today

## 2016-05-19 NOTE — Patient Instructions (Signed)
Labs today...................... I'll call you the report Week.Marland Kitchen

## 2016-05-19 NOTE — Progress Notes (Signed)
Pre visit review using our clinic review tool, if applicable. No additional management support is needed unless otherwise documented below in the visit note. 

## 2016-09-07 DIAGNOSIS — H2513 Age-related nuclear cataract, bilateral: Secondary | ICD-10-CM | POA: Diagnosis not present

## 2016-09-25 DIAGNOSIS — E860 Dehydration: Secondary | ICD-10-CM | POA: Diagnosis not present

## 2016-09-25 DIAGNOSIS — K219 Gastro-esophageal reflux disease without esophagitis: Secondary | ICD-10-CM | POA: Diagnosis not present

## 2016-09-25 DIAGNOSIS — Z8673 Personal history of transient ischemic attack (TIA), and cerebral infarction without residual deficits: Secondary | ICD-10-CM | POA: Diagnosis not present

## 2016-09-25 DIAGNOSIS — E785 Hyperlipidemia, unspecified: Secondary | ICD-10-CM | POA: Diagnosis not present

## 2016-09-25 DIAGNOSIS — R55 Syncope and collapse: Secondary | ICD-10-CM | POA: Diagnosis not present

## 2016-09-25 DIAGNOSIS — R531 Weakness: Secondary | ICD-10-CM | POA: Diagnosis not present

## 2016-09-25 DIAGNOSIS — K0889 Other specified disorders of teeth and supporting structures: Secondary | ICD-10-CM | POA: Diagnosis not present

## 2016-10-29 ENCOUNTER — Encounter: Payer: Self-pay | Admitting: Family Medicine

## 2016-11-03 ENCOUNTER — Ambulatory Visit (INDEPENDENT_AMBULATORY_CARE_PROVIDER_SITE_OTHER): Payer: Medicare Other | Admitting: Family

## 2016-11-03 ENCOUNTER — Ambulatory Visit (HOSPITAL_COMMUNITY)
Admission: RE | Admit: 2016-11-03 | Discharge: 2016-11-03 | Disposition: A | Discharge status: Home / Self Care | Payer: Medicare Other | Source: Ambulatory Visit | Attending: Family | Admitting: Family

## 2016-11-03 ENCOUNTER — Encounter: Payer: Self-pay | Admitting: Family

## 2016-11-03 VITALS — BP 148/71 | HR 52 | Temp 97.4°F | Resp 18 | Ht 73.0 in | Wt 203.0 lb

## 2016-11-03 DIAGNOSIS — Z9889 Other specified postprocedural states: Secondary | ICD-10-CM

## 2016-11-03 DIAGNOSIS — I6521 Occlusion and stenosis of right carotid artery: Secondary | ICD-10-CM | POA: Diagnosis not present

## 2016-11-03 LAB — VAS US CAROTID
LCCADDIAS: -18 cm/s
LCCAPDIAS: 18 cm/s
LCCAPSYS: 81 cm/s
LEFT ECA DIAS: -24 cm/s
LICADDIAS: -33 cm/s
LICADSYS: -97 cm/s
LICAPSYS: -108 cm/s
Left CCA dist sys: -59 cm/s
Left ICA prox dias: -41 cm/s
RIGHT CCA MID DIAS: 12 cm/s
RIGHT ECA DIAS: -14 cm/s
Right CCA prox dias: 15 cm/s
Right CCA prox sys: 70 cm/s
Right cca dist sys: -76 cm/s

## 2016-11-03 NOTE — Patient Instructions (Signed)
Stroke Prevention Some medical conditions and behaviors are associated with an increased chance of having a stroke. You may prevent a stroke by making healthy choices and managing medical conditions. How can I reduce my risk of having a stroke?  Stay physically active. Get at least 30 minutes of activity on most or all days.  Do not smoke. It may also be helpful to avoid exposure to secondhand smoke.  Limit alcohol use. Moderate alcohol use is considered to be: ? No more than 2 drinks per day for men. ? No more than 1 drink per day for nonpregnant women.  Eat healthy foods. This involves: ? Eating 5 or more servings of fruits and vegetables a day. ? Making dietary changes that address high blood pressure (hypertension), high cholesterol, diabetes, or obesity.  Manage your cholesterol levels. ? Making food choices that are high in fiber and low in saturated fat, trans fat, and cholesterol may control cholesterol levels. ? Take any prescribed medicines to control cholesterol as directed by your health care provider.  Manage your diabetes. ? Controlling your carbohydrate and sugar intake is recommended to manage diabetes. ? Take any prescribed medicines to control diabetes as directed by your health care provider.  Control your hypertension. ? Making food choices that are low in salt (sodium), saturated fat, trans fat, and cholesterol is recommended to manage hypertension. ? Ask your health care provider if you need treatment to lower your blood pressure. Take any prescribed medicines to control hypertension as directed by your health care provider. ? If you are 18-39 years of age, have your blood pressure checked every 3-5 years. If you are 40 years of age or older, have your blood pressure checked every year.  Maintain a healthy weight. ? Reducing calorie intake and making food choices that are low in sodium, saturated fat, trans fat, and cholesterol are recommended to manage  weight.  Stop drug abuse.  Avoid taking birth control pills. ? Talk to your health care provider about the risks of taking birth control pills if you are over 35 years old, smoke, get migraines, or have ever had a blood clot.  Get evaluated for sleep disorders (sleep apnea). ? Talk to your health care provider about getting a sleep evaluation if you snore a lot or have excessive sleepiness.  Take medicines only as directed by your health care provider. ? For some people, aspirin or blood thinners (anticoagulants) are helpful in reducing the risk of forming abnormal blood clots that can lead to stroke. If you have the irregular heart rhythm of atrial fibrillation, you should be on a blood thinner unless there is a good reason you cannot take them. ? Understand all your medicine instructions.  Make sure that other conditions (such as anemia or atherosclerosis) are addressed. Get help right away if:  You have sudden weakness or numbness of the face, arm, or leg, especially on one side of the body.  Your face or eyelid droops to one side.  You have sudden confusion.  You have trouble speaking (aphasia) or understanding.  You have sudden trouble seeing in one or both eyes.  You have sudden trouble walking.  You have dizziness.  You have a loss of balance or coordination.  You have a sudden, severe headache with no known cause.  You have new chest pain or an irregular heartbeat. Any of these symptoms may represent a serious problem that is an emergency. Do not wait to see if the symptoms will go away.   Get medical help at once. Call your local emergency services (911 in [place] Do not drive yourself to the hospital. This information is not intended to replace advice given to you by your health care provider. Make sure you discuss any questions you have with your health care provider. Document Released: 03/04/2004 Document Revised: 07/03/2015 Document Reviewed: 07/28/2012 Elsevier  Interactive Patient Education  2017 Elsevier Inc.     Preventing Cerebrovascular Disease Arteries are blood vessels that carry blood that contains oxygen from the heart to all parts of the body. Cerebrovascular disease affects arteries that supply the brain. Any condition that blocks or disrupts blood flow to the brain can cause cerebrovascular disease. Brain cells that lose blood supply start to die within minutes (stroke). Stroke is the main danger of cerebrovascular disease. Atherosclerosis and high blood pressure are common causes of cerebrovascular disease. Atherosclerosis is narrowing and hardening of an artery that results when fat, cholesterol, calcium, or other substances (plaque) build up inside an artery. Plaque reduces blood flow through the artery. High blood pressure increases the risk of bleeding inside the brain. Making diet and lifestyle changes to prevent atherosclerosis and high blood pressure lowers your risk of cerebrovascular disease. What nutrition changes can be made?  Eat more fruits, vegetables, and whole grains.  Reduce how much saturated fat you eat. To do this, eat less red meat and fewer full-fat dairy products.  Eat healthy proteins instead of red meat. Healthy proteins include: ? Fish. Eat fish that contains heart-healthy omega-3 fatty acids, twice a week. Examples include salmon, albacore tuna, mackerel, and herring. ? Chicken. ? Nuts. ? Low-fat or nonfat yogurt.  Avoid processed meats, like bacon and lunchmeat.  Avoid foods that contain: ? A lot of sugar, such as sweets and drinks with added sugar. ? A lot of salt (sodium). Avoid adding extra salt to your food, as told by your health care provider. ? Trans fats, such as margarine and baked goods. Trans fats may be listed as "partially hydrogenated oils" on food labels.  Check food labels to see how much sodium, sugar, and trans fats are in foods.  Use vegetable oils that contain low amounts of  saturated fat, such as olive oil or canola oil. What lifestyle changes can be made?  Drink alcohol in moderation. This means no more than 1 drink a day for nonpregnant women and 2 drinks a day for men. One drink equals 12 oz of beer, 5 oz of wine, or 1 oz of hard liquor.  If you are overweight, ask your health care provider to recommend a weight-loss plan for you. Losing 5-10 lb (2.2-4.5 kg) can reduce your risk of diabetes, atherosclerosis, and high blood pressure.  Exercise for 30?60 minutes on most days, or as much as told by your health care provider. ? Do moderate-intensity exercise, such as brisk walking, bicycling, and water aerobics. Ask your health care provider which activities are safe for you.  Do not use any products that contain nicotine or tobacco, such as cigarettes and e-cigarettes. If you need help quitting, ask your health care provider. Why are these changes important? Making these changes lowers your risk of many diseases that can cause cerebrovascular disease and stroke. Stroke is a leading cause of death and disability. Making these changes also improves your overall health and quality of life. What can I do to lower my risk? The following factors make you more likely to develop cerebrovascular disease:  Being overweight.  Smoking.  Being physically inactive.    Eating a high-fat diet.  Having certain health conditions, such as: ? Diabetes. ? High blood pressure. ? Heart disease. ? Atherosclerosis. ? High cholesterol. ? Sickle cell disease.  Talk with your health care provider about your risk for cerebrovascular disease. Work with your health care provider to control diseases that you have that may contribute to cerebrovascular disease. Your health care provider may prescribe medicines to help prevent major causes of cerebrovascular disease. Where to find more information: Learn more about preventing cerebrovascular disease from:  National Heart, Lung, and  Blood Institute: www.nhlbi.nih.gov/health/health-topics/topics/stroke  Centers for Disease Control and Prevention: cdc.gov/stroke/about.htm  Summary  Cerebrovascular disease can lead to a stroke.  Atherosclerosis and high blood pressure are major causes of cerebrovascular disease.  Making diet and lifestyle changes can reduce your risk of cerebrovascular disease.  Work with your health care provider to get your risk factors under control to reduce your risk of cerebrovascular disease. This information is not intended to replace advice given to you by your health care provider. Make sure you discuss any questions you have with your health care provider. Document Released: 02/09/2015 Document Revised: 08/15/2015 Document Reviewed: 02/09/2015 Elsevier Interactive Patient Education  2018 Elsevier Inc.  

## 2016-11-03 NOTE — Progress Notes (Signed)
Chief Complaint: Follow up Extracranial Carotid Artery Stenosis   History of Present Illness  Eduardo Shaw is a 71 y.o. male who returns for scheduled carotid arteries surveillance s/p right CEA on 08/03/2011 by Dr. Scot Dock.  Patient has no history of TIA or stroke symptoms. Specifically the patient denies a history of amaurosis fugax or monocular blindness, unilateral facial drooping, hemiplegia,  or receptive or expressive aphasia.   He denies claudication symptoms in his legs with walking. Dysphagia, had an endoscopy, normal results per pt.   Pt Diabetic: No Pt smoker: non-smoker  Pt meds include:  Statin : Yes  Betablocker: No  ASA: Yes, 81 mg daily.  Other anticoagulants/antiplatelets: none     Past Medical History:  Diagnosis Date  . Arthritis of foot   . Carotid artery occlusion   . Colon polyps    adenomatous  . Diverticulosis   . Esophageal stricture   . GERD (gastroesophageal reflux disease)    erosive esophagitis  . Hypercholesterolemia   . Lactose intolerance   . Skin cancer   . Stroke [facility]) June 2013   Mini     Social History Social History  Substance Use Topics  . Smoking status: Never Smoker  . Smokeless tobacco: Never Used  . Alcohol use 0.0 oz/week     Comment: 1-2 drinks on weekends    Family History Family History  Problem Relation Age of Onset  . Lupus Mother   . Stroke Mother   . Lupus Sister   . Colon cancer Neg Hx   . Colon polyps Neg Hx   . Rectal cancer Neg Hx   . Stomach cancer Neg Hx   . Esophageal cancer Neg Hx   . Pancreatic cancer Neg Hx   . Liver disease Neg Hx   . Kidney disease Neg Hx     Surgical History Past Surgical History:  Procedure Laterality Date  . CAROTID ENDARTERECTOMY Right August 03, 2011   CE  . COLONOSCOPY     [place]eras ago [place], normal per pt  . ENDARTERECTOMY  08/03/2011   Procedure: ENDARTERECTOMY CAROTID;  Surgeon: Angelia Mould, MD;  Location: [facility];  Service:  Vascular;  Laterality: Right;  . LUMBAR LAMINECTOMY/DECOMPRESSION MICRODISCECTOMY Bilateral 02/23/2014   Procedure: BILATERAL LUMBAR THREE-FOUR LAMINOTOMY AND MICRODISCECTOMY WITH RIGHT LUMBAR FOUR-FIVE LAMINOTOMY AND MICRODISCECTOMY;  Surgeon: Hosie Spangle, MD;  Location: [facility];  Service: Neurosurgery;  Laterality: Bilateral;  Bilateral L34 laminotomy with Left L34 microdiskectomy and possible Right L34 microdiskectomy and Right L45 Laminotomy and foraminotomy  . MOHS SURGERY     to face  . TONSILLECTOMY      Allergies  Allergen Reactions  . Morphine And Related Nausea And Vomiting    Gi upset  . Other     Bee sting - swelling in the areas stung    Current Outpatient Prescriptions  Medication Sig Dispense Refill  . aspirin EC 325 MG tablet Take 325 mg by mouth daily.    Marland Kitchen atorvastatin (LIPITOR) 40 MG tablet Take 1 tablet by mouth  every day at 6 pm 90 tablet 4  . hydrochlorothiazide (MICROZIDE) 12.5 MG capsule Take 1 capsule (12.5 mg total) by mouth daily. 100 capsule 4  . ibuprofen (ADVIL,MOTRIN) 200 MG tablet Take 200 mg by mouth every 6 (six) hours as needed.    Marland Kitchen omeprazole (PRILOSEC) 40 MG capsule Take 1 capsule (40 mg total) by mouth daily. 100 capsule 4  . sildenafil (REVATIO) 20 MG  tablet 1-2 tablets 3 hours prior to sex (Patient not taking: Reported on 05/19/2016) 30 tablet 11   No current facility-administered medications for this visit.     Review of Systems : See HPI for pertinent positives and negatives.  Physical Examination  Vitals:   11/03/16 1212  BP: (!) 146/73  Pulse: (!) 52  Resp: 18  Temp: (!) 97.4 F (36.3 C)  TempSrc: Oral  SpO2: 99%  Weight: 203 lb (92.1 kg)  Height: 6\' 1"  (1.854 m)   Body mass index is 26.78 kg/m.  General: WDWN male in NAD  GAIT:normal  Eyes: PERRLA  Pulmonary: Respirations are non labored, CTAB, good air movement in all fields.  Cardiac: regular rhythm and rate, no detected murmur.   VASCULAR EXAM Carotid  Bruits Left Right   Negative  Negative    LE Pulses  LEFT  RIGHT   POPLITEAL  not palpable  not palpable  POSTERIOR TIBIAL   palpable   palpable   DORSALIS PEDIS ANTERIOR TIBIAL  palpable  palpable    Gastrointestinal: soft, nontender, BS WNL, no r/g, no palpable masses.  Musculoskeletal: No muscle atrophy/wasting. M/S 5/5 throughout, Extremities without ischemic changes.  Neurologic: A&O X 3; Appropriate Affect, Speech is normal  CN 2-12 intact, Pain and light touch intact in extremities, motor exam as listed above.   Assessment: Eduardo Shaw is a 71 y.o. male who iss/p right CEA on 08/03/2011. He has no history of stroke or TIA.  Fortunately he does not have DM and has never used tobacco. He takes a daily ASA and a statin.   DATA Carotid Duplex (11/03/16): Right carotid endarterectomy site with no evidence for restenosis. 1 - 39% left internal carotid artery stenosis. Bilateral vertebral artery flow is antegrade.  Bilateral subclavian artery waveforms are normal.  Mild increased stenosis of the left ICA compared to the exam on 10-29-15.   Plan:  Follow-up in 1 year with Carotid Duplex scan.  I discussed in depth with the patient the nature of atherosclerosis, and emphasized the importance of maximal medical management including strict control of blood pressure, blood glucose, and lipid levels, obtaining regular exercise, and continued cessation of smoking.  The patient is aware that without maximal medical management the underlying atherosclerotic disease process will progress, limiting the benefit of any interventions. The patient was given information about stroke prevention and what symptoms should prompt the patient to seek immediate medical care. Thank you for allowing [place] to participate in this patient's care.  Clemon Chambers, RN, MSN, FNP-C [facility] Office: [phone]  Clinic Physician:  Scot Dock  11/03/16 12:15 PM

## 2017-01-05 NOTE — Addendum Note (Signed)
Addended by: Lianne Cure A on: 01/05/2017 11:26 AM   Modules accepted: Orders

## 2017-02-14 ENCOUNTER — Telehealth: Payer: Self-pay | Admitting: Family Medicine

## 2017-02-14 ENCOUNTER — Ambulatory Visit (INDEPENDENT_AMBULATORY_CARE_PROVIDER_SITE_OTHER): Payer: Medicare Other | Admitting: Family Medicine

## 2017-02-14 ENCOUNTER — Encounter: Payer: Self-pay | Admitting: Family Medicine

## 2017-02-14 VITALS — BP 150/80 | HR 70 | Temp 98.0°F | Ht 73.0 in | Wt 201.0 lb

## 2017-02-14 DIAGNOSIS — R1314 Dysphagia, pharyngoesophageal phase: Secondary | ICD-10-CM | POA: Diagnosis not present

## 2017-02-14 DIAGNOSIS — K253 Acute gastric ulcer without hemorrhage or perforation: Secondary | ICD-10-CM | POA: Diagnosis not present

## 2017-02-14 DIAGNOSIS — R030 Elevated blood-pressure reading, without diagnosis of hypertension: Secondary | ICD-10-CM

## 2017-02-14 DIAGNOSIS — N401 Enlarged prostate with lower urinary tract symptoms: Secondary | ICD-10-CM | POA: Diagnosis not present

## 2017-02-14 DIAGNOSIS — K2100 Gastro-esophageal reflux disease with esophagitis, without bleeding: Secondary | ICD-10-CM

## 2017-02-14 DIAGNOSIS — K222 Esophageal obstruction: Secondary | ICD-10-CM

## 2017-02-14 DIAGNOSIS — R1310 Dysphagia, unspecified: Secondary | ICD-10-CM

## 2017-02-14 DIAGNOSIS — R351 Nocturia: Secondary | ICD-10-CM | POA: Diagnosis not present

## 2017-02-14 DIAGNOSIS — E785 Hyperlipidemia, unspecified: Secondary | ICD-10-CM | POA: Diagnosis not present

## 2017-02-14 DIAGNOSIS — N529 Male erectile dysfunction, unspecified: Secondary | ICD-10-CM | POA: Diagnosis not present

## 2017-02-14 DIAGNOSIS — R739 Hyperglycemia, unspecified: Secondary | ICD-10-CM | POA: Diagnosis not present

## 2017-02-14 LAB — CBC WITH DIFFERENTIAL/PLATELET
BASOS PCT: 0.4 % (ref 0.0–3.0)
Basophils Absolute: 0 10*3/uL (ref 0.0–0.1)
EOS ABS: 0.3 10*3/uL (ref 0.0–0.7)
Eosinophils Relative: 3.5 % (ref 0.0–5.0)
HEMATOCRIT: 44.7 % (ref 39.0–52.0)
Hemoglobin: 14.8 g/dL (ref 13.0–17.0)
LYMPHS PCT: 20.2 % (ref 12.0–46.0)
Lymphs Abs: 1.6 10*3/uL (ref 0.7–4.0)
MCHC: 33.1 g/dL (ref 30.0–36.0)
MCV: 92.6 fl (ref 78.0–100.0)
Monocytes Absolute: 0.8 10*3/uL (ref 0.1–1.0)
Monocytes Relative: 9.6 % (ref 3.0–12.0)
NEUTROS ABS: 5.3 10*3/uL (ref 1.4–7.7)
Neutrophils Relative %: 66.3 % (ref 43.0–77.0)
PLATELETS: 204 10*3/uL (ref 150.0–400.0)
RBC: 4.82 Mil/uL (ref 4.22–5.81)
RDW: 13.1 % (ref 11.5–15.5)
WBC: 8 10*3/uL (ref 4.0–10.5)

## 2017-02-14 LAB — POCT URINALYSIS DIPSTICK
BILIRUBIN UA: NEGATIVE
Blood, UA: NEGATIVE
Glucose, UA: NEGATIVE
KETONES UA: NEGATIVE
Leukocytes, UA: NEGATIVE
Nitrite, UA: NEGATIVE
ODOR: NEGATIVE
Protein, UA: NEGATIVE
Spec Grav, UA: >=1.03 — AB (ref 1.010–1.025)
UROBILINOGEN UA: 0.2 U/dL
pH, UA: 5.5 (ref 5.0–8.0)

## 2017-02-14 LAB — LIPID PANEL
CHOL/HDL RATIO: 3
CHOLESTEROL: 143 mg/dL (ref 0–200)
HDL: 45.7 mg/dL (ref >39.00)
LDL Cholesterol: 78 mg/dL (ref 0–99)
NonHDL: 97.67
TRIGLYCERIDES: 96 mg/dL (ref 0.0–149.0)
VLDL: 19.2 mg/dL (ref 0.0–40.0)

## 2017-02-14 LAB — BASIC METABOLIC PANEL
BUN: 24 mg/dL — AB (ref 6–23)
CHLORIDE: 105 meq/L (ref 96–112)
CO2: 31 meq/L (ref 19–32)
Calcium: 9.3 mg/dL (ref 8.4–10.5)
Creatinine, Ser: 1.39 mg/dL (ref 0.40–1.50)
GFR: 53.28 mL/min — ABNORMAL LOW (ref >60.00)
GLUCOSE: 101 mg/dL — AB (ref 70–99)
POTASSIUM: 4.7 meq/L (ref 3.5–5.1)
Sodium: 144 mEq/L (ref 135–145)

## 2017-02-14 LAB — HEPATIC FUNCTION PANEL
ALK PHOS: 76 U/L (ref 39–117)
ALT: 10 U/L (ref 0–53)
AST: 13 U/L (ref 0–37)
Albumin: 4.1 g/dL (ref 3.5–5.2)
BILIRUBIN DIRECT: 0.1 mg/dL (ref 0.0–0.3)
TOTAL PROTEIN: 6.4 g/dL (ref 6.0–8.3)
Total Bilirubin: 0.6 mg/dL (ref 0.2–1.2)

## 2017-02-14 LAB — TSH: TSH: 2.85 u[IU]/mL (ref 0.35–4.50)

## 2017-02-14 LAB — PSA: PSA: 2.66 ng/mL (ref 0.10–4.00)

## 2017-02-14 MED ORDER — SILDENAFIL CITRATE 100 MG PO TABS: 50.0000 mg | ORAL_TABLET | Freq: Every day | ORAL | 11 refills | Status: DC | PRN

## 2017-02-14 MED ORDER — OMEPRAZOLE 40 MG PO CPDR: 40.0000 mg | DELAYED_RELEASE_CAPSULE | Freq: Every day | ORAL | 4 refills | Status: DC

## 2017-02-14 MED ORDER — ATORVASTATIN CALCIUM 40 MG PO TABS: ORAL_TABLET | ORAL | 4 refills | Status: DC

## 2017-02-14 NOTE — Telephone Encounter (Signed)
Pt stated that his height is incorrect in his chart, would like to update please.

## 2017-02-14 NOTE — Patient Instructions (Signed)
Motrin 400 mg twice daily with food......... walk 30 minutes daily........ if it some point this does not help the hip pain or the hip pain gets worse........ then I would recommend a consult with Dr. Hector Shade  Purchase an Omron pump up digital blood pressure cuff.......Marland Kitchen [place]......... check your blood pressure daily in the morning......Marland Kitchen return in 3 weeks with the device and all the data  Labs today.......... I will call you if there is anything abnormal  GI should call you about a follow-up colonoscopy  Brand-name Viagra 100 mg......... one half tablet 2-3 hours prior to sex........... [email or web address]

## 2017-02-14 NOTE — Progress Notes (Signed)
Eduardo Shaw is a 72 year old married male nonsmoker who comes in today for annual physical examination because of the following healthcare issues  He has a history of hyperlipidemia and takes Lipitor and aspirin tablet daily. He had a stroke a couple years ago and Septra had a right carotid endarterectomy. Doing well no complications neurologically intact.  He takes Hydrocort thiazide 12.5 mg daily for mild hypertension. BP today 150/80.  Takes Motrin about 2 tablets a day because of osteoarthritis  He takes Prilosec 40 mg daily because of a history of reflux esophagitis and esophageal stricture. He was dilated 2 years ago by Dr. Henrene Pastor. He's done well no complications.  He also colonoscopy a couple years ago which showed 4 polyps in transverse colon. Is due to go back in 2019 for follow-up colonoscopy.  He is a generic Viagra but it's not helping  He's having trouble with his right and left hips with walking. Has been bothering him now for 4-6 months. He is able to get around do he wants to do  He's had a history of lumbar disc surgery. Doing well no complications  Vaccinations do not shingles vaccine. Seasonal flu shot given at the pharmacy September 2018.  He gets routine eye care, dental care, colonoscopy as noted above.  Social history.........Marland Kitchen married lives here in [place] retired in the [employer]. His wife still works at a preschool.  EKG done because of a history of hyperlipidemia and elevated blood pressure. EKG was normal and unchanged  BP (!) 150/80   Pulse 70   Temp 98 F (36.7 C)   Ht 5\' 9"  (1.753 m)   Wt 201 lb (91.2 kg)   BMI 29.68 kg/m  He's well-developed well-nourished male no acute distress examination HEENT was negative except for scar right neck from previous carotid endarterectomy. Cardiopulmonary exam normal. Abdominal exam normal genitalia normal circumcised male rectum normal. Guaiac negative prostate normal extremities normal skin normal peripheral  pulses normal except scar midline back from previous lumbar discectomy. He also has significant decreased range of motion of his right and left hips. 30 right 45 on the left.  #1 hypertension not at goal......... BP check daily at home follow-up in 2-3 weeks  #2 hyperlipidemia.........Marland Kitchen refill medications.....Marland Kitchen check labs  #3 status post right carotid endarterectomy....... asymptomatic  #4status post lumbar disc disease.......Marland Kitchen relatively asymptomatic  #5 right and left degenerative joint disease hips...Marland KitchenMarland KitchenMarland Kitchen Motrin 400 mg twice a day exercise daily  #6 BPH with nocturia........ check PSA  #7 ED........... trial of branded Viagra.  #8 history of reflux esophagitis status post upper endoscopy with dilatation............ omeprazole 40 mg daily  #9 history of colon polyps......... follow-up endoscopy 2019   .

## 2017-02-15 ENCOUNTER — Encounter: Payer: Self-pay | Admitting: Family Medicine

## 2017-02-15 NOTE — Telephone Encounter (Signed)
The correct height has been recorded.

## 2017-03-07 ENCOUNTER — Encounter: Payer: Self-pay | Admitting: Family Medicine

## 2017-03-07 ENCOUNTER — Ambulatory Visit (INDEPENDENT_AMBULATORY_CARE_PROVIDER_SITE_OTHER): Payer: Medicare Other | Admitting: Family Medicine

## 2017-03-07 VITALS — BP 152/80 | HR 66 | Temp 97.9°F | Wt 203.0 lb

## 2017-03-07 DIAGNOSIS — I10 Essential (primary) hypertension: Secondary | ICD-10-CM | POA: Diagnosis not present

## 2017-03-07 MED ORDER — LISINOPRIL-HYDROCHLOROTHIAZIDE 10-12.5 MG PO TABS: 1.0000 | ORAL_TABLET | Freq: Every day | ORAL | 3 refills | Status: DC

## 2017-03-07 NOTE — Patient Instructions (Signed)
No salt diet  Walk 30 minutes daily  Zestoretic 10-12.5   .Marland Kitchen... 1 daily in the morning  Monitor your blood pressure daily  If in 4 weeks your blood pressure is not normal then send [place] a message on the chart and we will alter your dose. If after 4 weeks your blood pressure is normal continue to take it medication daily and monitor your blood pressure weekly as outlined  Remember the 2 potential side effects hives and cough

## 2017-03-07 NOTE — Progress Notes (Signed)
Eduardo Shaw is a 72 year old married male nonsmoker who comes in today for follow-up of elevated blood pressure  We saw him a couple weeks ago for routine checkup. At that time his blood pressure pressure pressure was elevated. He purchased a digital blood pressure cuff and again monitoring his blood pressure at different times during the day at home. His blood pressure readings average 145-150/90.  Positive family history of hypertension  Vs BP (!) 152/80 (BP Location: Left Arm, Patient Position: Sitting, Cuff Size: Normal)   Pulse 66   Temp 97.9 F (36.6 C) (Oral)   Wt 203 lb (92.1 kg)   BMI 26.78 kg/m  Well-developed well-nourished male no acute distress BP right arm sitting position 152/80 with his cuff is 158/83.  #1 hypertension...........Marland Kitchen begin ACE inhibitor....... discussed potential side effects including hives and cough.  BP goal 130/80.Marland Kitchen

## 2017-04-12 ENCOUNTER — Other Ambulatory Visit: Payer: Self-pay | Admitting: Family Medicine

## 2017-04-14 ENCOUNTER — Telehealth: Payer: Self-pay | Admitting: Family Medicine

## 2017-04-14 NOTE — Telephone Encounter (Signed)
Copied from [place] [phone]. Topic: Quick Communication - Rx Refill/Question >> Apr 14, 2017  9:49 AM Aurelio Brash B wrote: Medication:   hydrochlorothiazide (MICROZIDE) 12.5 MG capsule  atorvastatin (LIPITOR) 40 MG tablet  Has the patient contacted their pharmacy? yes   (Agent: If no, request that the patient contact the pharmacy for the refill.)   Preferred Pharmacy (with phone number or street name):[facility], [place] [phone] (Phone) [phone] (Fax)     Agent: Please be advised that RX refills may take up to 3 business days. We ask that you follow-up with your pharmacy.

## 2017-04-14 NOTE — Telephone Encounter (Signed)
Medication previously sent to [facility] with additional refills provided.

## 2017-05-02 ENCOUNTER — Other Ambulatory Visit: Payer: Self-pay | Admitting: Family Medicine

## 2017-05-02 ENCOUNTER — Telehealth: Payer: Self-pay | Admitting: Family Medicine

## 2017-05-02 DIAGNOSIS — K253 Acute gastric ulcer without hemorrhage or perforation: Secondary | ICD-10-CM

## 2017-05-02 DIAGNOSIS — K222 Esophageal obstruction: Secondary | ICD-10-CM

## 2017-05-02 DIAGNOSIS — R1314 Dysphagia, pharyngoesophageal phase: Secondary | ICD-10-CM

## 2017-05-02 DIAGNOSIS — K2100 Gastro-esophageal reflux disease with esophagitis, without bleeding: Secondary | ICD-10-CM

## 2017-05-02 NOTE — Telephone Encounter (Signed)
Copied from [place] [id number]. Topic: Quick Communication - Rx Refill/Question >> May 02, 2017  2:45 PM Neva Seat wrote: Omeprazole 40 mg  Pt needing refills to be filled at:  [facility] [place], [place] [address] [place] [address] Phone: [phone] Fax: [phone]

## 2017-05-02 NOTE — Telephone Encounter (Signed)
Called pt left a message for pt to return my call regarding what pharmacy he uses to refill his Omeprazole 40 mg

## 2017-05-03 MED ORDER — OMEPRAZOLE 40 MG PO CPDR: 40.0000 mg | DELAYED_RELEASE_CAPSULE | Freq: Every day | ORAL | 3 refills | Status: DC

## 2017-05-03 NOTE — Telephone Encounter (Signed)
Medication sent to [facility] as requested by pt.

## 2017-05-17 ENCOUNTER — Telehealth: Payer: Self-pay | Admitting: Family Medicine

## 2017-05-17 NOTE — Telephone Encounter (Signed)
Called pt left a message to return my call in the office 

## 2017-05-17 NOTE — Telephone Encounter (Signed)
Copied from [place] [phone]. Topic: Quick Communication - Rx Refill/Question >> May 17, 2017  8:43 AM Lennox Solders wrote: Reason for CRM: [facility] is [facility] to clarify if patient should be on both medication lisinopril- hctz and microzide.

## 2017-05-18 ENCOUNTER — Telehealth: Payer: Self-pay

## 2017-05-18 NOTE — Telephone Encounter (Signed)
Spoke with pt states that he is currently taking Lisinopril- Hydrochlorothiazide once daily. Pt states that his BP readings are still not down, average 150/76,pt states that he will continue taking the medication but requested to check with Dr Sherren Mocha for any recommendations.

## 2017-05-24 ENCOUNTER — Other Ambulatory Visit: Payer: Self-pay

## 2017-05-24 MED ORDER — LISINOPRIL-HYDROCHLOROTHIAZIDE 10-12.5 MG PO TABS: 2.0000 | ORAL_TABLET | Freq: Every day | ORAL | 3 refills | Status: DC

## 2017-05-24 NOTE — Telephone Encounter (Signed)
Eduardo Shaw please call Eduardo Shaw.........Marland Kitchen Let's increase his lisinopril to 20-12.5.Marland Kitchen... Dispenser 100... Directions 1 daily in the morning for high blood pressure refills 4. Then have him monitor his blood pressure in the morning and at bedtime for the next month to be sure this decreases his blood pressure normal which in his case would be 135/85 or less. If not at goal after one month then he'll need to come see [place] in the office

## 2017-05-24 NOTE — Telephone Encounter (Signed)
Spoke with pt voiced understanding that Dr Sherren Mocha has increased his lisinopril to 20 mg-12.5 mg, Rx has been sent to pt pharmacy for refill. Pt is aware to record blood pressure twice daily.

## 2017-06-03 ENCOUNTER — Telehealth: Payer: Self-pay | Admitting: *Deleted

## 2017-06-03 NOTE — Telephone Encounter (Signed)
Prior auth for Lisinopril 10-12.5mg -to take 2 daily was sent to [email or web address].

## 2017-06-07 ENCOUNTER — Other Ambulatory Visit: Payer: Self-pay

## 2017-06-07 ENCOUNTER — Telehealth: Payer: Self-pay | Admitting: Family Medicine

## 2017-06-07 NOTE — Telephone Encounter (Unsigned)
Copied from [place] [id number]. Topic: Quick Communication - See Telephone Encounter >> Jun 07, 2017  2:08 PM Ahmed Prima L wrote: [facility] called in regards to lisinopril-hydrochlorothiazide (PRINZIDE,ZESTORETIC) 10-12.5 MG tablet.   The direction now say to take two tablets daily, his plan does not cover this. [facility] wants to know if it can be switched to HCTZ 20-25 and have him take one tablet daily instead. >> Jun 07, 2017  2:08 PM Vernona Rieger wrote: Call back number is [phone] reference number [id number]

## 2017-06-07 NOTE — Telephone Encounter (Signed)
New Rx for HCTZ 20-25 has been approved by Dr Sherren Mocha and been phoned in to pt pharmacy. Spoke with pt voiced understanding

## 2017-06-07 NOTE — Telephone Encounter (Signed)
Copied from [place] [id number]. Topic: Quick Communication - See Telephone Encounter >> Jun 07, 2017 12:55 PM Robina Ade, Helene Kelp D wrote: CRM for notification. See Telephone encounter for: 06/07/17. Patient called and would like to talkto Dr. Sherren Mocha CMA about a Rx that was sent over incorrect. Please call patient back, thanks.

## 2017-06-15 ENCOUNTER — Encounter: Payer: Self-pay | Admitting: Internal Medicine

## 2017-06-23 NOTE — Telephone Encounter (Signed)
Pt called stating he has questions about recent med change. Pt requesting call back from nurse to discuss. [phone]

## 2017-06-24 NOTE — Telephone Encounter (Signed)
Spoke with pt regarding his side effects of lightheadedness  from his BP medications, pt  States that he will wait for Dr Sherren Mocha to return to the office to advise him on what to do.

## 2017-07-05 NOTE — Telephone Encounter (Signed)
Spoke with pt voiced understanding that e needs to start back on 10 mg lisinopril daily and to take records of his  BP and then send them to the office for Dr Sherren Mocha to review.

## 2017-07-05 NOTE — Telephone Encounter (Signed)
Eduardo Shaw I talk with Eduardo Shaw. We had increased his Zestoretic from 10 mg to 20 because his blood pressure was not at goal. However now his blood pressures too low 90s lightheaded when he stands up. I recommend he go back to the 10 mg daily do blood pressure checks daily and send [place] a message on my chart in 2 weeks with the data. When he does give me a ring and will discuss how to change or alter his medicine if he's not at goal thanks

## 2017-11-03 ENCOUNTER — Other Ambulatory Visit: Payer: Self-pay

## 2017-11-03 NOTE — Patient Outreach (Signed)
[facility]) Care Management  11/03/2017  MICHAE GRIMLEY 1945/12/09 [id number]   Medication Adherence call to Mr. Rayshawn Maney left a message for patient to call back patient is due on Lisinopril / Hctz 20/25 mg Mr. Burch is showing past due under [place].   [place] Management Direct Dial [phone]  Fax [phone] [email or web address]

## 2017-12-20 ENCOUNTER — Other Ambulatory Visit: Payer: Self-pay

## 2017-12-20 NOTE — Patient Outreach (Signed)
[facility]) Care Management  12/20/2017  Eduardo Shaw 1945-08-12 [id number]   Medication Adherence call to [name] left a message for patient to call back patient is due on Atorvastatin 40 mg. Eduardo Shaw is showing past due under [place].   [place] Management Direct Dial [phone]  Fax [phone] [email or web address]

## 2018-03-27 ENCOUNTER — Encounter: Payer: Medicare Other | Admitting: Family Medicine

## 2018-03-28 ENCOUNTER — Ambulatory Visit (INDEPENDENT_AMBULATORY_CARE_PROVIDER_SITE_OTHER): Payer: Medicare Other | Admitting: Family Medicine

## 2018-03-28 ENCOUNTER — Encounter: Payer: Self-pay | Admitting: Family Medicine

## 2018-03-28 VITALS — BP 110/66 | HR 60 | Temp 98.3°F | Ht 73.0 in | Wt 205.8 lb

## 2018-03-28 DIAGNOSIS — E785 Hyperlipidemia, unspecified: Secondary | ICD-10-CM

## 2018-03-28 DIAGNOSIS — R0982 Postnasal drip: Secondary | ICD-10-CM

## 2018-03-28 DIAGNOSIS — Z0001 Encounter for general adult medical examination with abnormal findings: Secondary | ICD-10-CM | POA: Diagnosis not present

## 2018-03-28 DIAGNOSIS — K253 Acute gastric ulcer without hemorrhage or perforation: Secondary | ICD-10-CM

## 2018-03-28 DIAGNOSIS — R1314 Dysphagia, pharyngoesophageal phase: Secondary | ICD-10-CM

## 2018-03-28 DIAGNOSIS — N529 Male erectile dysfunction, unspecified: Secondary | ICD-10-CM

## 2018-03-28 DIAGNOSIS — K2100 Gastro-esophageal reflux disease with esophagitis, without bleeding: Secondary | ICD-10-CM | POA: Insufficient documentation

## 2018-03-28 DIAGNOSIS — Z6827 Body mass index (BMI) 27.0-27.9, adult: Secondary | ICD-10-CM

## 2018-03-28 DIAGNOSIS — K222 Esophageal obstruction: Secondary | ICD-10-CM

## 2018-03-28 LAB — LIPID PANEL
CHOL/HDL RATIO: 4
CHOLESTEROL: 166 mg/dL (ref 0–200)
HDL: 45.8 mg/dL (ref >39.00)
LDL CALC: 94 mg/dL (ref 0–99)
NONHDL: 120.47
Triglycerides: 130 mg/dL (ref 0.0–149.0)
VLDL: 26 mg/dL (ref 0.0–40.0)

## 2018-03-28 LAB — CBC
HCT: 43.1 % (ref 39.0–52.0)
Hemoglobin: 14.5 g/dL (ref 13.0–17.0)
MCHC: 33.5 g/dL (ref 30.0–36.0)
MCV: 90.7 fl (ref 78.0–100.0)
PLATELETS: 196 10*3/uL (ref 150.0–400.0)
RBC: 4.76 Mil/uL (ref 4.22–5.81)
RDW: 13.5 % (ref 11.5–15.5)
WBC: 7.2 10*3/uL (ref 4.0–10.5)

## 2018-03-28 LAB — COMPREHENSIVE METABOLIC PANEL
ALT: 13 U/L (ref 0–53)
AST: 14 U/L (ref 0–37)
Albumin: 4 g/dL (ref 3.5–5.2)
Alkaline Phosphatase: 63 U/L (ref 39–117)
BILIRUBIN TOTAL: 0.5 mg/dL (ref 0.2–1.2)
BUN: 27 mg/dL — AB (ref 6–23)
CALCIUM: 9.2 mg/dL (ref 8.4–10.5)
CO2: 31 meq/L (ref 19–32)
CREATININE: 1.53 mg/dL — AB (ref 0.40–1.50)
Chloride: 106 mEq/L (ref 96–112)
GFR: 44.74 mL/min — ABNORMAL LOW (ref >60.00)
GLUCOSE: 90 mg/dL (ref 70–99)
Potassium: 4.4 mEq/L (ref 3.5–5.1)
SODIUM: 142 meq/L (ref 135–145)
Total Protein: 6.3 g/dL (ref 6.0–8.3)

## 2018-03-28 LAB — TSH: TSH: 1.47 u[IU]/mL (ref 0.35–4.50)

## 2018-03-28 MED ORDER — SILDENAFIL CITRATE 100 MG PO TABS: 50.0000 mg | ORAL_TABLET | Freq: Every day | ORAL | 11 refills | Status: DC | PRN

## 2018-03-28 MED ORDER — ATORVASTATIN CALCIUM 20 MG PO TABS: 20.0000 mg | ORAL_TABLET | Freq: Every day | ORAL | 3 refills | Status: DC

## 2018-03-28 NOTE — Assessment & Plan Note (Signed)
Stable.  Continue  omeprazole 40mg daily

## 2018-03-28 NOTE — Assessment & Plan Note (Signed)
Stable. Continue sildenafil as needed. 

## 2018-03-28 NOTE — Progress Notes (Signed)
Subjective:  Eduardo Shaw is a 73 y.o. male who presents today for his annual comprehensive physical exam.    HPI:  He has no acute complaints today.   He has had a tickling in his throat for the past several weeks. Overall seems to be stable. Worse at night.   # Dyslipidemia -On Lipitor 20 mg daily and tolerating well. -ROS: No reported myalgias  # GERD with history of esophageal stricture -Follows with gastroenterology -On omeprazole 40 mg daily and tolerating well.  # Erectile Dysfunction -Uses sildenafil 50 to 100 mg daily as needed.  Tolerating well without side effects.  % History of Melanoma - Follows with dermatology  Lifestyle Diet: No diets or eating plans. Trying intermittent fasting.  Exercise: Walk and play golf.   Depression screen PHQ 2/9 03/28/2018  Decreased Interest 0  Down, Depressed, Hopeless 0  PHQ - 2 Score 0    Health Maintenance Due  Topic Date Due  . Hepatitis C Screening  [birth year]  . COLONOSCOPY  07/08/2017     ROS: Per HPI, otherwise a complete review of systems was negative.   PMH:  The following were reviewed and entered/updated in epic: Past Medical History:  Diagnosis Date  . Arthritis of foot   . Carotid artery occlusion   . Colon polyps    adenomatous  . Diverticulosis   . Esophageal stricture   . GERD (gastroesophageal reflux disease)    erosive esophagitis  . Hypercholesterolemia   . Lactose intolerance   . Sarcoidosis 09/20/2007   Qualifier: Diagnosis of  By: Lenn Cal Deborra Medina), Wynona Canes    . Skin cancer   . Stroke [facility]) June 2013   Mini    Patient Active Problem List   Diagnosis Date Noted  . Gastroesophageal reflux disease with esophagitis 03/28/2018  . Hyperlipidemia 10/26/2011  . Erectile dysfunction 10/26/2011  . History of skin cancer 10/26/2011   Past Surgical History:  Procedure Laterality Date  . CAROTID ENDARTERECTOMY Right August 03, 2011   CE  . COLONOSCOPY     [place]eras ago [place], normal  per pt  . ENDARTERECTOMY  08/03/2011   Procedure: ENDARTERECTOMY CAROTID;  Surgeon: Angelia Mould, MD;  Location: [facility];  Service: Vascular;  Laterality: Right;  . LUMBAR LAMINECTOMY/DECOMPRESSION MICRODISCECTOMY Bilateral 02/23/2014   Procedure: BILATERAL LUMBAR THREE-FOUR LAMINOTOMY AND MICRODISCECTOMY WITH RIGHT LUMBAR FOUR-FIVE LAMINOTOMY AND MICRODISCECTOMY;  Surgeon: Hosie Spangle, MD;  Location: [facility];  Service: Neurosurgery;  Laterality: Bilateral;  Bilateral L34 laminotomy with Left L34 microdiskectomy and possible Right L34 microdiskectomy and Right L45 Laminotomy and foraminotomy  . MOHS SURGERY     to face  . TONSILLECTOMY      Family History  Problem Relation Age of Onset  . Lupus Mother   . Stroke Mother   . Lupus Sister   . Colon cancer Neg Hx   . Colon polyps Neg Hx   . Rectal cancer Neg Hx   . Stomach cancer Neg Hx   . Esophageal cancer Neg Hx   . Pancreatic cancer Neg Hx   . Liver disease Neg Hx   . Kidney disease Neg Hx     Medications- reviewed and updated Current Outpatient Medications  Medication Sig Dispense Refill  . aspirin EC 325 MG tablet Take 325 mg by mouth daily.    Marland Kitchen atorvastatin (LIPITOR) 20 MG tablet Take 1 tablet (20 mg total) by mouth daily at 6 PM. Take 1 tablet by mouth  every  day at 6 pm 90 tablet 3  . ibuprofen (ADVIL,MOTRIN) 200 MG tablet Take 200 mg by mouth every 6 (six) hours as needed.    Marland Kitchen omeprazole (PRILOSEC) 40 MG capsule TAKE 1 CAPSULE BY MOUTH  DAILY 90 capsule 4  . sildenafil (VIAGRA) 100 MG tablet Take 0.5-1 tablets (50-100 mg total) by mouth daily as needed for erectile dysfunction. 30 tablet 11   No current facility-administered medications for this visit.     Allergies-reviewed and updated Allergies  Allergen Reactions  . Morphine And Related Nausea And Vomiting    Gi upset  . Other     Bee sting - swelling in the areas stung    Social History   Socioeconomic History  . Marital status: Married    Spouse  name: Not on file  . Number of children: Not on file  . Years of education: Not on file  . Highest education level: Not on file  Occupational History  . Not on file  Social Needs  . Financial resource strain: Not on file  . Food insecurity:    Worry: Not on file    Inability: Not on file  . Transportation needs:    Medical: Not on file    Non-medical: Not on file  Tobacco Use  . Smoking status: Never Smoker  . Smokeless tobacco: Never Used  Substance and Sexual Activity  . Alcohol use: Yes    Alcohol/week: 0.0 standard drinks    Comment: 1-2 drinks on weekends  . Drug use: No  . Sexual activity: Not on file  Lifestyle  . Physical activity:    Days per week: Not on file    Minutes per session: Not on file  . Stress: Not on file  Relationships  . Social connections:    Talks on phone: Not on file    Gets together: Not on file    Attends religious service: Not on file    Active member of club or organization: Not on file    Attends meetings of clubs or organizations: Not on file    Relationship status: Not on file  Other Topics Concern  . 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  Finished high school.  Can read and write.  Hasn't seen a doctor in many years.    Objective:  Physical Exam: BP 110/66 (BP Location: Left Arm, Patient Position: Sitting, Cuff Size: Large)   Pulse 60   Temp 98.3 F (36.8 C) (Oral)   Ht 6\' 1"  (1.854 m)   Wt 205 lb 12 oz (93.3 kg)   SpO2 95%   BMI 27.15 kg/m   Body mass index is 27.15 kg/m. Wt Readings from Last 3 Encounters:  03/28/18 205 lb 12 oz (93.3 kg)  03/07/17 203 lb (92.1 kg)  02/14/17 201 lb (91.2 kg)   Gen: NAD, resting comfortably HEENT: TMs normal bilaterally. OP clear. No thyromegaly noted.  CV: RRR with no murmurs appreciated Pulm: NWOB, CTAB with no crackles, wheezes, or rhonchi GI: Normal bowel  sounds present. Soft, Nontender, Nondistended. MSK: no edema, cyanosis, or clubbing noted Skin: warm, dry Neuro: CN2-12 grossly intact. Strength 5/5 in upper and lower extremities. Reflexes symmetric and intact bilaterally.  Psych: Normal affect and thought content  Assessment/Plan:  Hyperlipidemia Stable.  Continue Lipitor 20 mg daily.  Check lipid panel, CBC, metabolic  panel, and TSH.  Gastroesophageal reflux disease with esophagitis Stable.  Continue omeprazole 40 mg daily.  Erectile dysfunction Stable.  Continue sildenafil as needed.  Postnasal drip No red flags.  Recommended over-the-counter Flonase as needed.  BMI 27 Discussed lifestyle interventions.  Preventative Healthcare: Had colonoscopy a few years ago-we will obtain his records.  Otherwise up-to-date on vaccines and screenings.  Patient Counseling(The following topics were reviewed and/or handout was given):  -Nutrition: Stressed importance of moderation in sodium/caffeine intake, saturated fat and cholesterol, caloric balance, sufficient intake of fresh fruits, vegetables, and fiber.  -Stressed the importance of regular exercise.   -Substance Abuse: Discussed cessation/primary prevention of tobacco, alcohol, or other drug use; driving or other dangerous activities under the influence; availability of treatment for abuse.   -Injury prevention: Discussed safety belts, safety helmets, smoke detector, smoking near bedding or upholstery.   -Sexuality: Discussed sexually transmitted diseases, partner selection, use of condoms, avoidance of unintended pregnancy and contraceptive alternatives.   -Dental health: Discussed importance of regular tooth brushing, flossing, and dental visits.  -Health maintenance and immunizations reviewed. Please refer to Health maintenance section.  Return to care in 1 year for next preventative visit.   Algis Greenhouse. Jerline Pain, MD 03/28/2018 3:10 PM

## 2018-03-28 NOTE — Assessment & Plan Note (Signed)
Stable.  Continue Lipitor 20 mg daily.  Check lipid panel, CBC, metabolic panel, and TSH.

## 2018-03-28 NOTE — Patient Instructions (Signed)
It was very nice to see you today!  Keep up the good work!  I will refill your medications today.  We will check lab work today.   Eat at least 3 REAL meals and 1-2 snacks per day.  Aim for no more than 5 hours between eating.  Eat breakfast within one hour of getting up.    Obtain twice as many fruits/vegetables as protein or carbohydrate foods for both lunch and dinner.   Cut down on sweet beverages. This includes juice, soda, and sweet tea.    Exercise at least 150 minutes every week.    Take care, Dr Jerline Pain   Preventive Care 34 Years and Older, Male Preventive care refers to lifestyle choices and visits with your health care provider that can promote health and wellness. What does preventive care include?   A yearly physical exam. This is also called an annual well check.  Dental exams once or twice a year.  Routine eye exams. Ask your health care provider how often you should have your eyes checked.  Personal lifestyle choices, including: ? Daily care of your teeth and gums. ? Regular physical activity. ? Eating a healthy diet. ? Avoiding tobacco and drug use. ? Limiting alcohol use. ? Practicing safe sex. ? Taking low doses of aspirin every day. ? Taking vitamin and mineral supplements as recommended by your health care provider. What happens during an annual well check? The services and screenings done by your health care provider during your annual well check will depend on your age, overall health, lifestyle risk factors, and family history of disease. Counseling Your health care provider may ask you questions about your:  Alcohol use.  Tobacco use.  Drug use.  Emotional well-being.  Home and relationship well-being.  Sexual activity.  Eating habits.  History of falls.  Memory and ability to understand (cognition).  Work and work Statistician. Screening You may have the following tests or measurements:  Height, weight, and BMI.  Blood  pressure.  Lipid and cholesterol levels. These may be checked every 5 years, or more frequently if you are over 71 years old.  Skin check.  Lung cancer screening. You may have this screening every year starting at age 7 if you have a 30-pack-year history of smoking and currently smoke or have quit within the past 15 years.  Colorectal cancer screening. All adults should have this screening starting at age 58 and continuing until age 67. You will have tests every 1-10 years, depending on your results and the type of screening test. People at increased risk should start screening at an earlier age. Screening tests may include: ? Guaiac-based fecal occult blood testing. ? Fecal immunochemical test (FIT). ? Stool DNA test. ? Virtual colonoscopy. ? Sigmoidoscopy. During this test, a flexible tube with a tiny camera (sigmoidoscope) is used to examine your rectum and lower colon. The sigmoidoscope is inserted through your anus into your rectum and lower colon. ? Colonoscopy. During this test, a long, thin, flexible tube with a tiny camera (colonoscope) is used to examine your entire colon and rectum.  Prostate cancer screening. Recommendations will vary depending on your family history and other risks.  Hepatitis C blood test.  Hepatitis B blood test.  Sexually transmitted disease (STD) testing.  Diabetes screening. This is done by checking your blood sugar (glucose) after you have not eaten for a while (fasting). You may have this done every 1-3 years.  Abdominal aortic aneurysm (AAA) screening. You may need this  if you are a current or former smoker.  Osteoporosis. You may be screened starting at age 44 if you are at high risk. Talk with your health care provider about your test results, treatment options, and if necessary, the need for more tests. Vaccines Your health care provider may recommend certain vaccines, such as:  Influenza vaccine. This is recommended every year.  Tetanus,  diphtheria, and acellular pertussis (Tdap, Td) vaccine. You may need a Td booster every 10 years.  Varicella vaccine. You may need this if you have not been vaccinated.  Zoster vaccine. You may need this after age 22.  Measles, mumps, and rubella (MMR) vaccine. You may need at least one dose of MMR if you were born in 1957 or later. You may also need a second dose.  Pneumococcal 13-valent conjugate (PCV13) vaccine. One dose is recommended after age 64.  Pneumococcal polysaccharide (PPSV23) vaccine. One dose is recommended after age 79.  Meningococcal vaccine. You may need this if you have certain conditions.  Hepatitis A vaccine. You may need this if you have certain conditions or if you travel or work in places where you may be exposed to hepatitis A.  Hepatitis B vaccine. You may need this if you have certain conditions or if you travel or work in places where you may be exposed to hepatitis B.  Haemophilus influenzae type b (Hib) vaccine. You may need this if you have certain risk factors. Talk to your health care provider about which screenings and vaccines you need and how often you need them. This information is not intended to replace advice given to you by your health care provider. Make sure you discuss any questions you have with your health care provider. Document Released: 02/21/2015 Document Revised: 03/17/2017 Document Reviewed: 11/26/2014 Elsevier Interactive Patient Education  2019 [employer].

## 2018-03-29 ENCOUNTER — Encounter: Payer: Self-pay | Admitting: Family Medicine

## 2018-03-29 DIAGNOSIS — N183 Chronic kidney disease, stage 3 unspecified: Secondary | ICD-10-CM | POA: Insufficient documentation

## 2018-03-29 NOTE — Progress Notes (Signed)
Please inform patient of the following:  His kidney numbers were up just a little bit but overall everything was stable. Do not need to make any changes to his treatment plan at this time.  We can recheck in 1 year.  Eduardo Shaw. Jerline Pain, MD 03/29/2018 8:21 AM

## 2018-03-31 ENCOUNTER — Other Ambulatory Visit: Payer: Self-pay

## 2018-03-31 DIAGNOSIS — I6521 Occlusion and stenosis of right carotid artery: Secondary | ICD-10-CM

## 2018-03-31 DIAGNOSIS — Z9889 Other specified postprocedural states: Secondary | ICD-10-CM

## 2018-04-06 ENCOUNTER — Encounter: Payer: Self-pay | Admitting: Physician Assistant

## 2018-04-06 ENCOUNTER — Ambulatory Visit: Payer: Medicare Other | Admitting: Physician Assistant

## 2018-04-06 ENCOUNTER — Ambulatory Visit (HOSPITAL_COMMUNITY)
Admission: RE | Admit: 2018-04-06 | Discharge: 2018-04-06 | Disposition: A | Discharge status: Home / Self Care | Payer: Medicare Other | Source: Ambulatory Visit | Attending: Family | Admitting: Family

## 2018-04-06 ENCOUNTER — Other Ambulatory Visit: Payer: Self-pay

## 2018-04-06 VITALS — BP 145/74 | HR 60 | Temp 96.7°F | Resp 18 | Ht 73.0 in | Wt 201.0 lb

## 2018-04-06 DIAGNOSIS — I6521 Occlusion and stenosis of right carotid artery: Secondary | ICD-10-CM

## 2018-04-06 DIAGNOSIS — I6523 Occlusion and stenosis of bilateral carotid arteries: Secondary | ICD-10-CM

## 2018-04-06 DIAGNOSIS — Z9889 Other specified postprocedural states: Secondary | ICD-10-CM

## 2018-04-06 NOTE — Progress Notes (Signed)
    Established Carotid Patient   History of Present Illness   Eduardo Shaw is a 73 y.o. (Apr 04, 1945) male who presents with chief complaint: carotid artery stenosis.  Previous carotid studies demonstrated: RICA 6-94% stenosis, LICA 5-03% stenosis.  Patient has no history of TIA or stroke symptom.  The patient has not had amaurosis fugax or monocular blindness.  The patient has not had facial drooping or hemiplegia.  The patient has not had receptive or expressive aphasia.    Surgical history R CEA by Dr. Scot Dock 2013  The patient's PMH, PSH, SH, and FamHx were reviewed and are unchanged from prior visit  Current Outpatient Medications  Medication Sig Dispense Refill  . aspirin EC 325 MG tablet Take 325 mg by mouth daily.    Marland Kitchen atorvastatin (LIPITOR) 20 MG tablet Take 1 tablet (20 mg total) by mouth daily at 6 PM. Take 1 tablet by mouth  every day at 6 pm 90 tablet 3  . ibuprofen (ADVIL,MOTRIN) 200 MG tablet Take 200 mg by mouth every 6 (six) hours as needed.    Marland Kitchen omeprazole (PRILOSEC) 40 MG capsule TAKE 1 CAPSULE BY MOUTH  DAILY 90 capsule 4  . sildenafil (VIAGRA) 100 MG tablet Take 0.5-1 tablets (50-100 mg total) by mouth daily as needed for erectile dysfunction. 30 tablet 11   No current facility-administered medications for this visit.     On ROS today: 10 system ROS is negative unless otherwise noted in HPI   Physical Examination   Vitals:   04/06/18 0843 04/06/18 0848  BP: 131/79 (!) 145/74  Pulse: 60 60  Resp: 18   Temp: (!) 96.7 F (35.9 C)   TempSrc: Oral   SpO2: 97%   Weight: 201 lb (91.2 kg)   Height: 6\' 1"  (1.854 m)    Body mass index is 26.52 kg/m.  General Alert, O x 3, WD, NAD  Neck Supple, mid-line trachea, Neck incision healed  Pulmonary Sym exp, good B air movt  Cardiac RRR, Nl S1, S2  Vascular Vessel Right Left  Radial Palpable Palpable  Carotid Palpable, No Bruit Palpable, No Bruit  Aorta Not palpable N/A  DP Palpable Palpable     Gastro- intestinal soft, NT, ND  Musculo- skeletal M/S 5/5 throughout  , Extremities without ischemic changes    Neurologic Cranial nerves 2-12 intact    Non-Invasive Vascular Imaging   B Carotid Duplex (04/06/18):   R ICA stenosis:  1-39%  R VA:  patent and antegrade  L ICA stenosis:  1-39%  L VA:  patent and antegrade   Medical Decision Making   Eduardo Shaw is a 73 y.o. male who presents for routine surveillance of carotid artery stenosis   Carotid duplex unchanged over the past year; unremarkable R ICA surgical site, and 1-39% stenosis  L ICA  Continue aspirin and statin daily  Follow up regularly with PCP for chronic medical conditions  Recheck carotid duplex in 1 year   Dagoberto Ligas PA-C [facility] Office: [phone]  Clinic MD: Dr. Oneida Alar

## 2018-06-29 ENCOUNTER — Other Ambulatory Visit: Payer: Self-pay

## 2018-06-29 ENCOUNTER — Telehealth: Payer: Self-pay | Admitting: Family Medicine

## 2018-06-29 NOTE — Telephone Encounter (Signed)
See request °

## 2018-06-29 NOTE — Telephone Encounter (Signed)
Copied from [place] [id number]. Topic: Quick Communication - Rx Refill/Question >> Jun 29, 2018 12:14 PM Leward Quan A wrote: Medication: Lisinopril / HCTZ 20/25 ( patient have been taking half of a tab because of dizziness so need Lisinopril 10/12.5)   Has the patient contacted their pharmacy? Yes.   (Agent: If no, request that the patient contact the pharmacy for the refill.) (Agent: If yes, when and what did the pharmacy advise?)  Preferred Pharmacy (with phone number or street name): [facility], [place] [phone] (Phone) [phone] (Fax    Agent: Please be advised that RX refills may take up to 3 business days. We ask that you follow-up with your pharmacy.

## 2018-06-30 NOTE — Telephone Encounter (Signed)
Left voice message for patient to call clinic.Patient needs appt.

## 2018-06-30 NOTE — Telephone Encounter (Signed)
Patient scheduled for appt.

## 2018-07-04 ENCOUNTER — Ambulatory Visit (INDEPENDENT_AMBULATORY_CARE_PROVIDER_SITE_OTHER): Payer: Medicare Other | Admitting: Family Medicine

## 2018-07-04 ENCOUNTER — Encounter: Payer: Self-pay | Admitting: Family Medicine

## 2018-07-04 DIAGNOSIS — I10 Essential (primary) hypertension: Secondary | ICD-10-CM

## 2018-07-04 DIAGNOSIS — M2550 Pain in unspecified joint: Secondary | ICD-10-CM

## 2018-07-04 MED ORDER — DICLOFENAC SODIUM 1 % TD GEL: 4.0000 g | Freq: Four times a day (QID) | TRANSDERMAL | 11 refills | Status: DC

## 2018-07-04 MED ORDER — LISINOPRIL-HYDROCHLOROTHIAZIDE 10-12.5 MG PO TABS: 1.0000 | ORAL_TABLET | Freq: Every day | ORAL | 3 refills | Status: DC

## 2018-07-04 NOTE — Assessment & Plan Note (Signed)
Well-controlled.  Will refill lisinopril-HCTZ.  Continue home blood pressure monitoring with goal 140/90 or lower.  If continues to be well controlled, consider weaning off or stopping blood pressure medications.

## 2018-07-04 NOTE — Assessment & Plan Note (Signed)
Likely secondary to multisite osteoarthritis.  No red flags.  He will continue taking Tylenol.  Recommended glucosamine-chondroitin and turmeric supplements.  Also start Voltaren gel.  He would like to avoid NSAIDs for the time being due to possible worsening if he were to acquire COVID-19.  Discussed reasons to return to care.

## 2018-07-04 NOTE — Progress Notes (Signed)
    Chief Complaint:  Eduardo Shaw is a 73 y.o. male who presents today for a virtual office visit with a chief complaint of Hypertension.   Assessment/Plan:  Essential hypertension Well-controlled.  Will refill lisinopril-HCTZ.  Continue home blood pressure monitoring with goal 140/90 or lower.  If continues to be well controlled, consider weaning off or stopping blood pressure medications.  Polyarthralgia Likely secondary to multisite osteoarthritis.  No red flags.  He will continue taking Tylenol.  Recommended glucosamine-chondroitin and turmeric supplements.  Also start Voltaren gel.  He would like to avoid NSAIDs for the time being due to possible worsening if he were to acquire COVID-19.  Discussed reasons to return to care.     Subjective:  HPI:  Essential Hypertension On lisinopril 10-12.5 once daily. Tolerating well. No side effects. Home BPs 110s/70s   Joint Pain New problem to provider.  Several year history.  Located throughout several joints in his body including knees, hips, and back.  Has tried taking ibuprofen which works however he is scared to continue this due to COVID-19.  He has been taking Tylenol which does not help as much.  He has been on glucosamine/chondroitin the past but does not remember it is helped.  No reported fevers or chills.  No swelling.  No redness.  No other specific treatments tried.  No other obvious alleviating or aggravating factors.   ROS: Per HPI  PMH: He reports that he has never smoked. He has never used smokeless tobacco. He reports current alcohol use. He reports that he does not use drugs.      Objective/Observations  Physical Exam: Gen: NAD, resting comfortably Pulm: Normal work of breathing Neuro: Grossly normal, moves all extremities Psych: Normal affect and thought content  Virtual Visit via Video   I connected with Nickolas Madrid on 07/04/18 at  2:20 PM EDT by a video enabled telemedicine application and verified that  I am speaking with the correct person using two identifiers. I discussed the limitations of evaluation and management by telemedicine and the availability of in person appointments. The patient expressed understanding and agreed to proceed.   Patient location: Home Provider location: [place] participating in the virtual visit: Myself and Patient     Algis Greenhouse. Jerline Pain, MD 07/04/2018 2:37 PM

## 2018-08-03 ENCOUNTER — Other Ambulatory Visit: Payer: Self-pay

## 2018-08-03 NOTE — Patient Outreach (Signed)
Uncertain [facility]) Care Management  08/03/2018  Eduardo Shaw Dec 28, 1945 [id number]   Medication Adherence call to Mr. Elveria Rising Hippa Identifiers Verify spoke with patient he is past due on Atorvastatin 20 mg patient explain he is taking 1 tablet daily and has enough for a couple of days patient ask if we can call [facility] an order this medication [facility] will mail out with in 7 days. Mr. Bochicchio is showing past due under [place].   [place] Management Direct Dial [phone]  Fax [phone] [email or web address]

## 2018-08-30 ENCOUNTER — Telehealth: Payer: Self-pay | Admitting: Family Medicine

## 2018-08-30 ENCOUNTER — Other Ambulatory Visit: Payer: Self-pay

## 2018-08-30 DIAGNOSIS — K253 Acute gastric ulcer without hemorrhage or perforation: Secondary | ICD-10-CM

## 2018-08-30 DIAGNOSIS — K2100 Gastro-esophageal reflux disease with esophagitis, without bleeding: Secondary | ICD-10-CM

## 2018-08-30 DIAGNOSIS — R1314 Dysphagia, pharyngoesophageal phase: Secondary | ICD-10-CM

## 2018-08-30 DIAGNOSIS — K222 Esophageal obstruction: Secondary | ICD-10-CM

## 2018-08-30 MED ORDER — OMEPRAZOLE 40 MG PO CPDR: 40.0000 mg | DELAYED_RELEASE_CAPSULE | Freq: Every day | ORAL | 3 refills | Status: DC

## 2018-08-30 NOTE — Telephone Encounter (Signed)
Rx sent to pharmacy   

## 2018-08-30 NOTE — Telephone Encounter (Signed)
Pt need a refill on omeprazole 40 mg #90 w/refills . This med was last refilled by dr todd. [facility]

## 2018-09-28 ENCOUNTER — Telehealth: Payer: Self-pay | Admitting: Family Medicine

## 2018-09-28 DIAGNOSIS — W57XXXA Bitten or stung by nonvenomous insect and other nonvenomous arthropods, initial encounter: Secondary | ICD-10-CM | POA: Diagnosis not present

## 2018-09-28 DIAGNOSIS — S60466A Insect bite (nonvenomous) of right little finger, initial encounter: Secondary | ICD-10-CM | POA: Diagnosis not present

## 2018-09-28 DIAGNOSIS — L0390 Cellulitis, unspecified: Secondary | ICD-10-CM | POA: Diagnosis not present

## 2018-09-28 NOTE — Telephone Encounter (Signed)
I left a message asking the patient to call me at [phone] to schedule AWV with Courtney. VDM (Dee-Dee) °

## 2018-10-02 ENCOUNTER — Ambulatory Visit (INDEPENDENT_AMBULATORY_CARE_PROVIDER_SITE_OTHER): Payer: Medicare Other

## 2018-10-02 ENCOUNTER — Other Ambulatory Visit: Payer: Self-pay

## 2018-10-02 VITALS — BP 126/62 | Temp 98.1°F | Ht 73.0 in | Wt 199.0 lb

## 2018-10-02 DIAGNOSIS — Z0000 Encounter for general adult medical examination without abnormal findings: Secondary | ICD-10-CM | POA: Diagnosis not present

## 2018-10-02 NOTE — Progress Notes (Signed)
I have personally reviewed the Medicare Annual Wellness Visit and agree with the assessment and plan.  Algis Greenhouse. Jerline Pain, MD 10/02/2018 2:16 PM

## 2018-10-02 NOTE — Patient Instructions (Addendum)
[name] , Thank you for taking time to come for your Medicare Wellness Visit. I appreciate your ongoing commitment to your health goals. Please review the following plan we discussed and let me know if I can assist you in the future.   Screening recommendations/referrals: Colorectal Screening: completed 07/09/14  Vision and Dental Exams: Recommended annual ophthalmology exams for early detection of glaucoma and other disorders of the eye Recommended annual dental exams for proper oral hygiene  Vaccinations: Influenza vaccine:  recommended this fall either at PCP office or through your local pharmacy  Pneumococcal vaccine: completed 01/19/16 Tdap vaccine: up to date; last 10/26/11 Shingles vaccine: Please call your insurance company to determine your out of pocket expense for the Shingrix vaccine. You may receive this vaccine at your local pharmacy.  Advanced directives: Please bring a copy of your POA (Power of Attorney) and/or Living Will to your next appointment.  Goals: Recommend to drink at least 6-8 8oz glasses of water per day.  Next appointment: Please schedule your Annual Wellness Visit with your Nurse Health Advisor in one year.  Preventive Care 66 Years and Older, Male Preventive care refers to lifestyle choices and visits with your health care provider that can promote health and wellness. What does preventive care include?  A yearly physical exam. This is also called an annual well check.  Dental exams once or twice a year.  Routine eye exams. Ask your health care provider how often you should have your eyes checked.  Personal lifestyle choices, including:  Daily care of your teeth and gums.  Regular physical activity.  Eating a healthy diet.  Avoiding tobacco and drug use.  Limiting alcohol use.  Practicing safe sex.  Taking low doses of aspirin every day if recommended by your health care provider..  Taking vitamin and mineral supplements as recommended  by your health care provider. What happens during an annual well check? The services and screenings done by your health care provider during your annual well check will depend on your age, overall health, lifestyle risk factors, and family history of disease. Counseling  Your health care provider may ask you questions about your:  Alcohol use.  Tobacco use.  Drug use.  Emotional well-being.  Home and relationship well-being.  Sexual activity.  Eating habits.  History of falls.  Memory and ability to understand (cognition).  Work and work Statistician. Screening  You may have the following tests or measurements:  Height, weight, and BMI.  Blood pressure.  Lipid and cholesterol levels. These may be checked every 5 years, or more frequently if you are over 71 years old.  Skin check.  Lung cancer screening. You may have this screening every year starting at age 46 if you have a 30-pack-year history of smoking and currently smoke or have quit within the past 15 years.  Fecal occult blood test (FOBT) of the stool. You may have this test every year starting at age 67.  Flexible sigmoidoscopy or colonoscopy. You may have a sigmoidoscopy every 5 years or a colonoscopy every 10 years starting at age 50.  Prostate cancer screening. Recommendations will vary depending on your family history and other risks.  Hepatitis C blood test.  Hepatitis B blood test.  Sexually transmitted disease (STD) testing.  Diabetes screening. This is done by checking your blood sugar (glucose) after you have not eaten for a while (fasting). You may have this done every 1-3 years.  Abdominal aortic aneurysm (AAA) screening. You may need this if  you are a current or former smoker.  Osteoporosis. You may be screened starting at age 58 if you are at high risk. Talk with your health care provider about your test results, treatment options, and if necessary, the need for more tests. Vaccines  Your  health care provider may recommend certain vaccines, such as:  Influenza vaccine. This is recommended every year.  Tetanus, diphtheria, and acellular pertussis (Tdap, Td) vaccine. You may need a Td booster every 10 years.  Zoster vaccine. You may need this after age 67.  Pneumococcal 13-valent conjugate (PCV13) vaccine. One dose is recommended after age 37.  Pneumococcal polysaccharide (PPSV23) vaccine. One dose is recommended after age 47. Talk to your health care provider about which screenings and vaccines you need and how often you need them. This information is not intended to replace advice given to you by your health care provider. Make sure you discuss any questions you have with your health care provider. Document Released: 02/21/2015 Document Revised: 10/15/2015 Document Reviewed: 11/26/2014 Elsevier Interactive Patient Education  2017 [place] Prevention in the Home Falls can cause injuries. They can happen to people of all ages. There are many things you can do to make your home safe and to help prevent falls. What can I do on the outside of my home?  Regularly fix the edges of walkways and driveways and fix any cracks.  Remove anything that might make you trip as you walk through a door, such as a raised step or threshold.  Trim any bushes or trees on the path to your home.  Use bright outdoor lighting.  Clear any walking paths of anything that might make someone trip, such as rocks or tools.  Regularly check to see if handrails are loose or broken. Make sure that both sides of any steps have handrails.  Any raised decks and porches should have guardrails on the edges.  Have any leaves, snow, or ice cleared regularly.  Use sand or salt on walking paths during winter.  Clean up any spills in your garage right away. This includes oil or grease spills. What can I do in the bathroom?  Use night lights.  Install grab bars by the toilet and in the tub and  shower. Do not use towel bars as grab bars.  Use non-skid mats or decals in the tub or shower.  If you need to sit down in the shower, use a plastic, non-slip stool.  Keep the floor dry. Clean up any water that spills on the floor as soon as it happens.  Remove soap buildup in the tub or shower regularly.  Attach bath mats securely with double-sided non-slip rug tape.  Do not have throw rugs and other things on the floor that can make you trip. What can I do in the bedroom?  Use night lights.  Make sure that you have a light by your bed that is easy to reach.  Do not use any sheets or blankets that are too big for your bed. They should not hang down onto the floor.  Have a firm chair that has side arms. You can use this for support while you get dressed.  Do not have throw rugs and other things on the floor that can make you trip. What can I do in the kitchen?  Clean up any spills right away.  Avoid walking on wet floors.  Keep items that you use a lot in easy-to-reach places.  If you need to  reach something above you, use a strong step stool that has a grab bar.  Keep electrical cords out of the way.  Do not use floor polish or wax that makes floors slippery. If you must use wax, use non-skid floor wax.  Do not have throw rugs and other things on the floor that can make you trip. What can I do with my stairs?  Do not leave any items on the stairs.  Make sure that there are handrails on both sides of the stairs and use them. Fix handrails that are broken or loose. Make sure that handrails are as long as the stairways.  Check any carpeting to make sure that it is firmly attached to the stairs. Fix any carpet that is loose or worn.  Avoid having throw rugs at the top or bottom of the stairs. If you do have throw rugs, attach them to the floor with carpet tape.  Make sure that you have a light switch at the top of the stairs and the bottom of the stairs. If you do not  have them, ask someone to add them for you. What else can I do to help prevent falls?  Wear shoes that:  Do not have high heels.  Have rubber bottoms.  Are comfortable and fit you well.  Are closed at the toe. Do not wear sandals.  If you use a stepladder:  Make sure that it is fully opened. Do not climb a closed stepladder.  Make sure that both sides of the stepladder are locked into place.  Ask someone to hold it for you, if possible.  Clearly mark and make sure that you can see:  Any grab bars or handrails.  First and last steps.  Where the edge of each step is.  Use tools that help you move around (mobility aids) if they are needed. These include:  Canes.  Walkers.  Scooters.  Crutches.  Turn on the lights when you go into a dark area. Replace any light bulbs as soon as they burn out.  Set up your furniture so you have a clear path. Avoid moving your furniture around.  If any of your floors are uneven, fix them.  If there are any pets around you, be aware of where they are.  Review your medicines with your doctor. Some medicines can make you feel dizzy. This can increase your chance of falling. Ask your doctor what other things that you can do to help prevent falls. This information is not intended to replace advice given to you by your health care provider. Make sure you discuss any questions you have with your health care provider. Document Released: 11/21/2008 Document Revised: 07/03/2015 Document Reviewed: 03/01/2014 Elsevier Interactive Patient Education  2017 [employer].

## 2018-10-02 NOTE — Progress Notes (Signed)
Subjective:   Eduardo Shaw is a 73 y.o. male who presents for Medicare Annual/Subsequent preventive examination.  Review of Systems:   Cardiac Risk Factors include: advanced age (>70men, >14 women);hypertension;dyslipidemia     Objective:    Vitals: BP 126/62   Temp 98.1 F (36.7 C)   Ht 6\' 1"  (1.854 m)   Wt 199 lb (90.3 kg)   BMI 26.25 kg/m   Body mass index is 26.25 kg/m.  Advanced Directives 10/02/2018 11/03/2016 10/29/2015 10/23/2014 07/09/2014 06/25/2014 02/23/2014  Does Patient Have a Medical Advance Directive? Yes Yes - No Yes Yes Yes  Type of Advance Directive Living will;Healthcare Power of [place] will - Living will [place];Living will [place];Living will  Does patient want to make changes to medical advance directive? No - Patient declined - - - - - No - Patient declined  Copy of [place] in Chart? No - copy requested No - copy requested - - - - No - copy requested  Would patient like information on creating a medical advance directive? - - - Yes - Educational materials given - - -    Tobacco Social History   Tobacco Use  Smoking Status Never Smoker  Smokeless Tobacco Never Used     Clinical Intake:  Pre-visit preparation completed: Yes  Pain : 0-10 Pain Score: 2  Pain Type: Acute pain Pain Location: Foot Pain Orientation: Right Pain Descriptors / Indicators: Aching Pain Onset: More than a month ago  Diabetes: No  How often do you need to have someone help you when you read instructions, pamphlets, or other written materials from your doctor or pharmacy?: 1 - Never  Interpreter Needed?: No  Information entered by :: Denman George LPN  Past Medical History:  Diagnosis Date  . Arthritis of foot   . Carotid artery occlusion   . Colon polyps    adenomatous  . Diverticulosis   . Esophageal stricture   . GERD (gastroesophageal reflux disease)    erosive esophagitis  . Hypercholesterolemia   . Lactose intolerance   . Sarcoidosis 09/20/2007   Qualifier: Diagnosis of  By: Lenn Cal Deborra Medina), Wynona Canes    . Skin cancer   . Stroke [facility]) June 2013   Mini    Past Surgical History:  Procedure Laterality Date  . CAROTID ENDARTERECTOMY Right August 03, 2011   CE  . COLONOSCOPY     [place], normal per pt  . ENDARTERECTOMY  08/03/2011   Procedure: ENDARTERECTOMY CAROTID;  Surgeon: Angelia Mould, MD;  Location: [facility];  Service: Vascular;  Laterality: Right;  . LUMBAR LAMINECTOMY/DECOMPRESSION MICRODISCECTOMY Bilateral 02/23/2014   Procedure: BILATERAL LUMBAR THREE-FOUR LAMINOTOMY AND MICRODISCECTOMY WITH RIGHT LUMBAR FOUR-FIVE LAMINOTOMY AND MICRODISCECTOMY;  Surgeon: Hosie Spangle, MD;  Location: [facility];  Service: Neurosurgery;  Laterality: Bilateral;  Bilateral L34 laminotomy with Left L34 microdiskectomy and possible Right L34 microdiskectomy and Right L45 Laminotomy and foraminotomy  . MOHS SURGERY     to face  . TONSILLECTOMY     Family History  Problem Relation Age of Onset  . Lupus Mother   . Stroke Mother   . Lupus Sister   . Colon cancer Neg Hx   . Colon polyps Neg Hx   . Rectal cancer Neg Hx   . Stomach cancer Neg Hx   . Esophageal cancer Neg Hx   . Pancreatic cancer Neg Hx   .  Liver disease Neg Hx   . Kidney disease Neg Hx    Social History   Socioeconomic History  . Marital status: Married    Spouse name: Not on file  . Number of children: Not on file  . Years of education: Not on file  . Highest education level: Not on file  Occupational History  . Not on file  Social Needs  . Financial resource strain: Not on file  . Food insecurity    Worry: Not on file    Inability: Not on file  . Transportation needs    Medical: Not on file    Non-medical: Not on file  Tobacco Use  . Smoking status: Never Smoker  . Smokeless tobacco: Never Used  Substance and Sexual Activity  . Alcohol use:  Yes    Alcohol/week: 0.0 standard drinks    Comment: 1-2 drinks on weekends  . Drug use: No  . Sexual activity: Not on file  Lifestyle  . Physical activity    Days per week: Not on file    Minutes per session: Not on file  . Stress: Not on file  Relationships  . Social Herbalist on phone: Not on file    Gets together: Not on file    Attends religious service: Not on file    Active member of club or organization: Not on file    Attends meetings of clubs or organizations: Not on file    Relationship status: Not on file  Other Topics Concern  . 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  Finished high school.  Can read and write.  Hasn't seen a doctor in many years.    Outpatient Encounter Medications as of 10/02/2018  Medication Sig  . aspirin EC 325 MG tablet Take 325 mg by mouth daily.  Marland Kitchen atorvastatin (LIPITOR) 20 MG tablet Take 1 tablet (20 mg total) by mouth daily at 6 PM. Take 1 tablet by mouth  every day at 6 pm  . diclofenac sodium (VOLTAREN) 1 % GEL Apply 4 g topically 4 (four) times daily.  Marland Kitchen doxycycline (VIBRAMYCIN) 100 MG capsule   . glucosamine-chondroitin 500-400 MG tablet Take by mouth.  Marland Kitchen ibuprofen (ADVIL,MOTRIN) 200 MG tablet Take 200 mg by mouth every 6 (six) hours as needed.  Marland Kitchen lisinopril-hydrochlorothiazide (ZESTORETIC) 10-12.5 MG tablet Take 1 tablet by mouth daily.  Marland Kitchen omeprazole (PRILOSEC) 40 MG capsule Take 1 capsule (40 mg total) by mouth daily.  . sildenafil (VIAGRA) 100 MG tablet Take 0.5-1 tablets (50-100 mg total) by mouth daily as needed for erectile dysfunction.   No facility-administered encounter medications on file as of 10/02/2018.     Activities of Daily Living In your present state of health, do you have any difficulty performing the following activities: 10/02/2018  Hearing? N  Vision? N  Difficulty concentrating or  making decisions? N  Walking or climbing stairs? N  Dressing or bathing? N  Doing errands, shopping? N  Preparing Food and eating ? N  Using the Toilet? N  In the past six months, have you accidently leaked urine? N  Do you have problems with loss of bowel control? N  Managing your Medications? N  Managing your Finances? N  Housekeeping or managing your Housekeeping? N  Some recent data might be hidden    Patient Care  Team: Vivi Barrack, MD as PCP - General (Family Medicine) Calvert Cantor, MD as Consulting Physician (Ophthalmology) Elam Dutch, MD as Consulting Physician (Vascular Surgery)   Assessment:   This is a routine wellness examination for [place].  Exercise Activities and Dietary recommendations Current Exercise Habits: Home exercise routine, Type of exercise: walking, Time (Minutes): 30, Frequency (Times/Week): 5, Weekly Exercise (Minutes/Week): 150, Intensity: Mild  Goals   None     Fall Risk Fall Risk  10/02/2018 03/28/2018 01/19/2016 02/13/2014  Falls in the past year? 0 0 No No  Number falls in past yr: 0 - - -  Injury with Fall? 0 - - -  Follow up Education provided - - -    Timed Get Up and Go Performed: completed and within normal timeframe   Depression Screen PHQ 2/9 Scores 10/02/2018 03/28/2018 01/19/2016 02/13/2014  PHQ - 2 Score 0 0 0 0       Cognitive Testing  Alert? Yes         Normal Appearance?  Oriented to person? Yes           Place? Yes  Time? Yes  Recall of three objects? Yes  Can perform simple calculations? Yes  Displays appropriate judgment? Yes  Can read the correct time from a watch face? Yes   No cognitive concerns at this time     Immunization History  Administered Date(s) Administered  . Influenza Split 10/26/2011  . Influenza, High Dose Seasonal PF 01/19/2016, 11/14/2017  . Pneumococcal Conjugate-13 07/04/2014  . Pneumococcal Polysaccharide-23 01/19/2016  . Tdap 10/26/2011    Qualifies for Shingles Vaccine?  Discussed and patient will check with pharmacy for coverage.  Patient education handout provided    Screening Tests Health Maintenance  Topic Date Due  . Hepatitis C Screening  04-01-1945  . COLONOSCOPY  07/08/2017  . INFLUENZA VACCINE  09/09/2018  . TETANUS/TDAP  10/25/2021  . PNA vac Low Risk Adult  Completed   Cancer Screenings: Lung: Low Dose CT Chest recommended if Age 2-80 years, 30 pack-year currently smoking OR have quit w/in 15years. Patient does not qualify. Colorectal: completed; last colonoscopy 07/09/14    Plan:    I have personally reviewed and addressed the Medicare Annual Wellness questionnaire and have noted the following in the patient's chart:  A. Medical and social history B. Use of alcohol, tobacco or illicit drugs  C. Current medications and supplements D. Functional ability and status E.  Nutritional status F.  Physical activity G. Advance directives H. List of other physicians I.  Hospitalizations, surgeries, and ER visits in previous 12 months J.  [place] such as hearing and vision if needed, cognitive and depression L. Referrals, records requested, and appointments- none   In addition, I have reviewed and discussed with patient certain preventive protocols, quality metrics, and best practice recommendations. A written personalized care plan for preventive services as well as general preventive health recommendations were provided to patient.   Signed,  Denman George, LPN  Nurse Health Advisor   Nurse Notes: Patient states that he has been taking blood pressure medication intermittently and checking readings at home with no problems.  Will discuss with provider at upcoming appointment and call with any concerns

## 2018-10-04 ENCOUNTER — Other Ambulatory Visit: Payer: Self-pay

## 2018-10-04 NOTE — Patient Outreach (Signed)
[facility] Care Management  10/04/2018  Eduardo Shaw [birth year] [id number]   Medication Adherence call to Eduardo Shaw Hippa Identifiers Verify spoke with patient he is past due on Lisinopril/Hctz 10/12.5 mg patient explains he takes 1 tablet daily and has medication at this patient will order when due.Eduardo Shaw is showing past due under [place].   [facility] Management Direct Dial [phone]  Fax [phone] [email or web address]

## 2018-11-09 ENCOUNTER — Ambulatory Visit (INDEPENDENT_AMBULATORY_CARE_PROVIDER_SITE_OTHER): Payer: Medicare Other

## 2018-11-09 ENCOUNTER — Other Ambulatory Visit: Payer: Self-pay

## 2018-11-09 ENCOUNTER — Ambulatory Visit: Payer: Medicare Other | Admitting: Podiatry

## 2018-11-09 ENCOUNTER — Encounter: Payer: Self-pay | Admitting: Podiatry

## 2018-11-09 ENCOUNTER — Other Ambulatory Visit: Payer: Self-pay | Admitting: Podiatry

## 2018-11-09 VITALS — BP 159/75

## 2018-11-09 DIAGNOSIS — M79672 Pain in left foot: Secondary | ICD-10-CM | POA: Diagnosis not present

## 2018-11-09 DIAGNOSIS — M779 Enthesopathy, unspecified: Secondary | ICD-10-CM

## 2018-11-09 DIAGNOSIS — M79671 Pain in right foot: Secondary | ICD-10-CM | POA: Diagnosis not present

## 2018-11-09 DIAGNOSIS — M722 Plantar fascial fibromatosis: Secondary | ICD-10-CM

## 2018-11-09 DIAGNOSIS — M7752 Other enthesopathy of left foot: Secondary | ICD-10-CM | POA: Diagnosis not present

## 2018-11-09 NOTE — Patient Outreach (Signed)
[facility]) Care Management  11/09/2018  TAYSHAUN PARZIALE 1945/12/15 [id number]   Medication Adherence call to Mr. Elveria Rising Hippa Identifiers Verify spoke with patient he is past due on Atorvastatin 20 mg and Lisinopril/Hctz 10/12.5 mg patient explain he is taking both medications he has medication at this time but will check to see if he needs to order. Mr. Colaluca is showing past due under [place].   [facility] Management Direct Dial [phone]  Fax [phone] [email or web address]

## 2018-11-09 NOTE — Patient Instructions (Signed)

## 2018-11-15 NOTE — Progress Notes (Signed)
Subjective:   Patient ID: Eduardo Shaw, male   DOB: 73 y.o.   MRN: [id number]   HPI Patient presents stating having a lot of pain in the bottom of the left heel and the bottom of the right heel and it is been going on for around 6 months and gets moderate discomfort also in the posterior Achilles complex which appears to be compensatory.  Patient does not smoke likes to be active.  Patient is also complaining the pain in his left ankle   Review of Systems  All other systems reviewed and are negative.       Objective:  Physical Exam Vitals signs and nursing note reviewed.  Constitutional:      Appearance: He is well-developed.  Pulmonary:     Effort: Pulmonary effort is normal.  Musculoskeletal: Normal range of motion.  Skin:    General: Skin is warm.  Neurological:     Mental Status: He is alert.     Neurovascular status found to be intact muscle strength found to be adequate range of motion within normal limits right foot with quite a bit of loss of motion of the subtalar joint left with quite a bit of inflammation in the sinus tarsi left and into the plantar heel fascial insertion bilateral with right being worse than the left     Assessment:  Acute plantar fasciitis right upper left with inflammatory capsulitis of the sinus tarsi left with subtalar joint arthritis     Plan:  H&P reviewed both conditions and today and get a focus on the right heel where I did sterile prep and injected the fascia 3 mg Kenalog 5 mg Xylocaine and injected the left sinus tarsi 3 mg Kenalog 5 mg Xylocaine advised on support therapy and dispensed fascial brace to lift up the arch right and discussed the long-term arthritis of the left  X-ray indicates there is indications of arthritic changes in the subtalar joint left with depression of the arch and moderate plantar spur formation bilaterally

## 2018-12-07 ENCOUNTER — Encounter: Payer: Self-pay | Admitting: Podiatry

## 2018-12-07 ENCOUNTER — Ambulatory Visit: Payer: Medicare Other | Admitting: Podiatry

## 2018-12-07 ENCOUNTER — Other Ambulatory Visit: Payer: Self-pay

## 2018-12-07 DIAGNOSIS — M722 Plantar fascial fibromatosis: Secondary | ICD-10-CM

## 2018-12-07 DIAGNOSIS — M778 Other enthesopathies, not elsewhere classified: Secondary | ICD-10-CM | POA: Diagnosis not present

## 2018-12-08 NOTE — Progress Notes (Signed)
Subjective:   Patient ID: Eduardo Shaw, male   DOB: 73 y.o.   MRN: [id number]   HPI Patient states that the heel right feels a lot better but still having a lot of problems with the left ankle and states that he has tried to walk as best he can but it still gets sore   ROS      Objective:  Physical Exam  Neurovascular status intact with patient's right heel improved with inflammation plantar fascial-like condition and on the left there is significant range of motion loss of the ankle with inflammation of the extensor tendon complex sinus tarsi also noted     Assessment:  Current improvement of the plantar fascial right with continued chronic arthritis of the subtalar joint left possible midtarsal joint with extensor tendinitis     Plan:  H&P reviewed both conditions and for the plantar fascial recommended continued exercises and supportive shoes.  For the left I did discuss were trying to control his symptoms and that ultimately it may require the consideration of subtalar joint fusion.  Today I did extensor tendon injection 3 mg Kenalog 5 mg Xylocaine to reduce the inflammation and advised on supportive shoe gear usage

## 2019-01-12 ENCOUNTER — Other Ambulatory Visit: Payer: Self-pay

## 2019-01-12 DIAGNOSIS — Z20822 Contact with and (suspected) exposure to covid-19: Secondary | ICD-10-CM

## 2019-01-14 LAB — NOVEL CORONAVIRUS, NAA: SARS-CoV-2, NAA: NOT DETECTED

## 2019-02-19 ENCOUNTER — Ambulatory Visit: Payer: Medicare Other | Attending: Internal Medicine

## 2019-02-19 DIAGNOSIS — Z20822 Contact with and (suspected) exposure to covid-19: Secondary | ICD-10-CM | POA: Diagnosis not present

## 2019-02-20 LAB — NOVEL CORONAVIRUS, NAA: SARS-CoV-2, NAA: NOT DETECTED

## 2019-02-23 ENCOUNTER — Encounter: Payer: Self-pay | Admitting: Family Medicine

## 2019-03-01 ENCOUNTER — Encounter: Payer: Self-pay | Admitting: Podiatry

## 2019-03-01 ENCOUNTER — Ambulatory Visit: Payer: Medicare Other | Admitting: Podiatry

## 2019-03-01 ENCOUNTER — Other Ambulatory Visit: Payer: Self-pay

## 2019-03-01 VITALS — Temp 97.2°F

## 2019-03-01 DIAGNOSIS — M722 Plantar fascial fibromatosis: Secondary | ICD-10-CM

## 2019-03-01 DIAGNOSIS — M19072 Primary osteoarthritis, left ankle and foot: Secondary | ICD-10-CM

## 2019-03-01 DIAGNOSIS — M779 Enthesopathy, unspecified: Secondary | ICD-10-CM

## 2019-03-02 ENCOUNTER — Ambulatory Visit: Payer: Medicare Other | Attending: Internal Medicine

## 2019-03-02 DIAGNOSIS — Z23 Encounter for immunization: Secondary | ICD-10-CM | POA: Insufficient documentation

## 2019-03-02 NOTE — Progress Notes (Signed)
   Covid-19 Vaccination Clinic  Name:  Eduardo Shaw    MRN: [id number] DOB: 1945/06/16  03/02/2019  Eduardo Shaw was observed post Covid-19 immunization for 15 minutes without incidence. He was provided with Vaccine Information Sheet and instruction to access the V-Safe system.   Eduardo Shaw was instructed to call 911 with any severe reactions post vaccine: Marland Kitchen Difficulty breathing  . Swelling of your face and throat  . A fast heartbeat  . A bad rash all over your body  . Dizziness and weakness    Immunizations Administered    Name Date Dose VIS Date Route   Pfizer COVID-19 Vaccine 03/02/2019  1:16 PM 0.3 mL 01/19/2019 Intramuscular   Manufacturer: [place]   Lot: [id number]   [place]: [id number]

## 2019-03-05 NOTE — Progress Notes (Signed)
Subjective:   Patient ID: Eduardo Shaw, male   DOB: 74 y.o.   MRN: [id number]   HPI Patient presents stating that I started to develop pain again in my left ankle and I know I do have arthritis that is part of the problem.  Patient also states the right heel is improved with pain still upon deep palpation    ROS      Objective:  Physical Exam  Neurovascular status intact with patient found to have severe loss range of motion of the left subtalar joint with patient noted to have plantar heel pain right that is improved still present      Assessment:  Subtalar joint arthritis left with loss of motion along with plantar fasciitis right     Plan:  H&P reviewed conditions and today we will get a continue to work on the ankle and I also had ped orthotist evaluate him for consideration at one point of AFO bracing.  I did do a subtalar joint injection along with a lateral ankle gutter and I advised on anti-inflammatories and brace usage.  For the right we will continue stretching exercises anti-inflammatories and patient will be seen back when symptomatic and again I did review subtalar joint fusion which he wants to avoid as best as possible

## 2019-03-23 ENCOUNTER — Ambulatory Visit: Payer: Medicare Other | Attending: Internal Medicine

## 2019-03-23 DIAGNOSIS — Z23 Encounter for immunization: Secondary | ICD-10-CM

## 2019-03-23 NOTE — Progress Notes (Signed)
   Covid-19 Vaccination Clinic  Name:  LYN WITHEM    MRN: [id number] DOB: 06-04-1945  03/23/2019  Mr. Ohare was observed post Covid-19 immunization for 15 minutes without incidence. He was provided with Vaccine Information Sheet and instruction to access the V-Safe system.   Mr. Statt was instructed to call 911 with any severe reactions post vaccine: Marland Kitchen Difficulty breathing  . Swelling of your face and throat  . A fast heartbeat  . A bad rash all over your body  . Dizziness and weakness    Immunizations Administered    Name Date Dose VIS Date Route   Pfizer COVID-19 Vaccine 03/23/2019 11:08 AM 0.3 mL 01/19/2019 Intramuscular   Manufacturer: [place]   Lot: [id number]   [place]: [id number]

## 2019-04-02 ENCOUNTER — Other Ambulatory Visit: Payer: Self-pay

## 2019-04-03 ENCOUNTER — Ambulatory Visit (INDEPENDENT_AMBULATORY_CARE_PROVIDER_SITE_OTHER): Payer: Medicare Other | Admitting: Family Medicine

## 2019-04-03 VITALS — BP 110/62 | HR 65 | Temp 97.2°F | Ht 71.5 in | Wt 201.2 lb

## 2019-04-03 DIAGNOSIS — I10 Essential (primary) hypertension: Secondary | ICD-10-CM

## 2019-04-03 DIAGNOSIS — M2550 Pain in unspecified joint: Secondary | ICD-10-CM

## 2019-04-03 DIAGNOSIS — K2100 Gastro-esophageal reflux disease with esophagitis, without bleeding: Secondary | ICD-10-CM | POA: Diagnosis not present

## 2019-04-03 DIAGNOSIS — I6523 Occlusion and stenosis of bilateral carotid arteries: Secondary | ICD-10-CM

## 2019-04-03 DIAGNOSIS — E785 Hyperlipidemia, unspecified: Secondary | ICD-10-CM

## 2019-04-03 DIAGNOSIS — Z125 Encounter for screening for malignant neoplasm of prostate: Secondary | ICD-10-CM

## 2019-04-03 DIAGNOSIS — R739 Hyperglycemia, unspecified: Secondary | ICD-10-CM | POA: Diagnosis not present

## 2019-04-03 DIAGNOSIS — Z0001 Encounter for general adult medical examination with abnormal findings: Secondary | ICD-10-CM | POA: Diagnosis not present

## 2019-04-03 DIAGNOSIS — N183 Chronic kidney disease, stage 3 unspecified: Secondary | ICD-10-CM | POA: Diagnosis not present

## 2019-04-03 LAB — LIPID PANEL
Cholesterol: 170 mg/dL (ref 0–200)
HDL: 47.9 mg/dL (ref >39.00)
LDL Cholesterol: 89 mg/dL (ref 0–99)
NonHDL: 121.93
Total CHOL/HDL Ratio: 4
Triglycerides: 163 mg/dL — ABNORMAL HIGH (ref 0.0–149.0)
VLDL: 32.6 mg/dL (ref 0.0–40.0)

## 2019-04-03 LAB — PSA: PSA: 2.58 ng/mL (ref 0.10–4.00)

## 2019-04-03 LAB — COMPREHENSIVE METABOLIC PANEL
ALT: 12 U/L (ref 0–53)
AST: 14 U/L (ref 0–37)
Albumin: 4.3 g/dL (ref 3.5–5.2)
Alkaline Phosphatase: 66 U/L (ref 39–117)
BUN: 25 mg/dL — ABNORMAL HIGH (ref 6–23)
CO2: 30 mEq/L (ref 19–32)
Calcium: 9.6 mg/dL (ref 8.4–10.5)
Chloride: 104 mEq/L (ref 96–112)
Creatinine, Ser: 1.57 mg/dL — ABNORMAL HIGH (ref 0.40–1.50)
GFR: 43.31 mL/min — ABNORMAL LOW (ref >60.00)
Glucose, Bld: 109 mg/dL — ABNORMAL HIGH (ref 70–99)
Potassium: 4.3 mEq/L (ref 3.5–5.1)
Sodium: 141 mEq/L (ref 135–145)
Total Bilirubin: 0.6 mg/dL (ref 0.2–1.2)
Total Protein: 6.7 g/dL (ref 6.0–8.3)

## 2019-04-03 LAB — CBC
HCT: 43.9 % (ref 39.0–52.0)
Hemoglobin: 14.6 g/dL (ref 13.0–17.0)
MCHC: 33.3 g/dL (ref 30.0–36.0)
MCV: 93.3 fl (ref 78.0–100.0)
Platelets: 197 10*3/uL (ref 150.0–400.0)
RBC: 4.71 Mil/uL (ref 4.22–5.81)
RDW: 13.1 % (ref 11.5–15.5)
WBC: 8.4 10*3/uL (ref 4.0–10.5)

## 2019-04-03 LAB — TSH: TSH: 2.23 u[IU]/mL (ref 0.35–4.50)

## 2019-04-03 NOTE — Patient Instructions (Signed)
It was very nice to see you today!  Keep up the good work.  We will check blood work today.  I will place a referral for you to get your colon cancer screening done.  I will also place a referral for you to see the sports medicine doctor.  Come back in 1 year for your next physical, or sooner if needed.  Take care, Dr Jerline Pain  Please try these tips to maintain a healthy lifestyle:   Eat at least 3 REAL meals and 1-2 snacks per day.  Aim for no more than 5 hours between eating.  If you eat breakfast, please do so within one hour of getting up.    Each meal should contain half fruits/vegetables, one quarter protein, and one quarter carbs (no bigger than a computer mouse)   Cut down on sweet beverages. This includes juice, soda, and sweet tea.     Drink at least 1 glass of water with each meal and aim for at least 8 glasses per day   Exercise at least 150 minutes every week.    Preventive Care 30 Years and Older, Male Preventive care refers to lifestyle choices and visits with your health care provider that can promote health and wellness. This includes:  A yearly physical exam. This is also called an annual well check.  Regular dental and eye exams.  Immunizations.  Screening for certain conditions.  Healthy lifestyle choices, such as diet and exercise. What can I expect for my preventive care visit? Physical exam Your health care provider will check:  Height and weight. These may be used to calculate body mass index (BMI), which is a measurement that tells if you are at a healthy weight.  Heart rate and blood pressure.  Your skin for abnormal spots. Counseling Your health care provider may ask you questions about:  Alcohol, tobacco, and drug use.  Emotional well-being.  Home and relationship well-being.  Sexual activity.  Eating habits.  History of falls.  Memory and ability to understand (cognition).  Work and work Statistician. What immunizations  do I need?  Influenza (flu) vaccine  This is recommended every year. Tetanus, diphtheria, and pertussis (Tdap) vaccine  You may need a Td booster every 10 years. Varicella (chickenpox) vaccine  You may need this vaccine if you have not already been vaccinated. Zoster (shingles) vaccine  You may need this after age 36. Pneumococcal conjugate (PCV13) vaccine  One dose is recommended after age 46. Pneumococcal polysaccharide (PPSV23) vaccine  One dose is recommended after age 27. Measles, mumps, and rubella (MMR) vaccine  You may need at least one dose of MMR if you were born in 1957 or later. You may also need a second dose. Meningococcal conjugate (MenACWY) vaccine  You may need this if you have certain conditions. Hepatitis A vaccine  You may need this if you have certain conditions or if you travel or work in places where you may be exposed to hepatitis A. Hepatitis B vaccine  You may need this if you have certain conditions or if you travel or work in places where you may be exposed to hepatitis B. Haemophilus influenzae type b (Hib) vaccine  You may need this if you have certain conditions. You may receive vaccines as individual doses or as more than one vaccine together in one shot (combination vaccines). Talk with your health care provider about the risks and benefits of combination vaccines. What tests do I need? Blood tests  Lipid and cholesterol levels.  These may be checked every 5 years, or more frequently depending on your overall health.  Hepatitis C test.  Hepatitis B test. Screening  Lung cancer screening. You may have this screening every year starting at age 31 if you have a 30-pack-year history of smoking and currently smoke or have quit within the past 15 years.  Colorectal cancer screening. All adults should have this screening starting at age 26 and continuing until age 48. Your health care provider may recommend screening at age 58 if you are at  increased risk. You will have tests every 1-10 years, depending on your results and the type of screening test.  Prostate cancer screening. Recommendations will vary depending on your family history and other risks.  Diabetes screening. This is done by checking your blood sugar (glucose) after you have not eaten for a while (fasting). You may have this done every 1-3 years.  Abdominal aortic aneurysm (AAA) screening. You may need this if you are a current or former smoker.  Sexually transmitted disease (STD) testing. Follow these instructions at home: Eating and drinking  Eat a diet that includes fresh fruits and vegetables, whole grains, lean protein, and low-fat dairy products. Limit your intake of foods with high amounts of sugar, saturated fats, and salt.  Take vitamin and mineral supplements as recommended by your health care provider.  Do not drink alcohol if your health care provider tells you not to drink.  If you drink alcohol: ? Limit how much you have to 0-2 drinks a day. ? Be aware of how much alcohol is in your drink. In the [place] one drink equals one 12 oz bottle of beer (355 mL), one 5 oz glass of wine (148 mL), or one 1 oz glass of hard liquor (44 mL). Lifestyle  Take daily care of your teeth and gums.  Stay active. Exercise for at least 30 minutes on 5 or more days each week.  Do not use any products that contain nicotine or tobacco, such as cigarettes, e-cigarettes, and chewing tobacco. If you need help quitting, ask your health care provider.  If you are sexually active, practice safe sex. Use a condom or other form of protection to prevent STIs (sexually transmitted infections).  Talk with your health care provider about taking a low-dose aspirin or statin. What's next?  Visit your health care provider once a year for a well check visit.  Ask your health care provider how often you should have your eyes and teeth checked.  Stay up to date on all  vaccines. This information is not intended to replace advice given to you by your health care provider. Make sure you discuss any questions you have with your health care provider. Document Revised: 01/19/2018 Document Reviewed: 01/19/2018 Elsevier Patient Education  2020 [employer].

## 2019-04-03 NOTE — Assessment & Plan Note (Signed)
Continue management per vascular surgery.  We will continue Lipitor 20 mg daily.

## 2019-04-03 NOTE — Progress Notes (Signed)
Chief Complaint:  Eduardo Shaw is a 74 y.o. male who presents today for his annual comprehensive physical exam.    Assessment/Plan:  Chronic Problems Addressed Today: Hyperlipidemia Stable.  Continue Lipitor 20 mg daily.  Check lipid panel, CBC, C met, and TSH.  Carotid stenosis Continue management per vascular surgery.  We will continue Lipitor 20 mg daily.  Polyarthralgia Will place referral to see sports medicine.  Likely has underlying OA.  CKD (chronic kidney disease) stage 3, GFR 30-59 ml/min (HCC) Check C met today.  Gastroesophageal reflux disease with esophagitis Stable.  Continue Prilosec 40 mg daily.  Essential hypertension At goal off meds.   Body mass index is 27.68 kg/m. / Overweight BMI Metric Follow Up - 04/03/19 1427      BMI Metric Follow Up-Please document annually   BMI Metric Follow Up  Education provided        Preventative Healthcare: Check CBC, C met, TSH, PSA, lipid panel.  Place referral for repeat colonoscopy-last done was in 2016 with recommendation for 3-year follow-up.  Has already received both doses of COVID-19 vaccination.  Patient Counseling(The following topics were reviewed and/or handout was given):  -Nutrition: Stressed importance of moderation in sodium/caffeine intake, saturated fat and cholesterol, caloric balance, sufficient intake of fresh fruits, vegetables, and fiber.  -Stressed the importance of regular exercise.   -Substance Abuse: Discussed cessation/primary prevention of tobacco, alcohol, or other drug use; driving or other dangerous activities under the influence; availability of treatment for abuse.   -Injury prevention: Discussed safety belts, safety helmets, smoke detector, smoking near bedding or upholstery.   -Sexuality: Discussed sexually transmitted diseases, partner selection, use of condoms, avoidance of unintended pregnancy and contraceptive alternatives.   -Dental health: Discussed importance of regular  tooth brushing, flossing, and dental visits.  -Health maintenance and immunizations reviewed. Please refer to Health maintenance section.  Return to care in 1 year for next preventative visit.     Subjective:  HPI:  He has no acute complaints today.   He has had worsening bilateral hip pain.  Has a history of osteoarthritis.  Has seen podiatry in the past for his foot pain.  Pain seems to be worse in his right hip.  Worse with certain motions.  Has tried taking ibuprofen with modest improvement.  He is also having some fullness sensation in his ears.  No drainage.  He has stopped taking his blood pressure medication since our last visit.  He has been monitoring home blood pressures at home and they have been at goal.  Lifestyle Diet: None specific.  Exercise: Limited due to polyarthralgia.   Depression screen PHQ 2/9 04/03/2019  Decreased Interest 0  Down, Depressed, Hopeless 0  PHQ - 2 Score 0    Health Maintenance Due  Topic Date Due  . Hepatitis C Screening  1945/12/22     ROS: Per HPI, otherwise a complete review of systems was negative.   PMH:  The following were reviewed and entered/updated in epic: Past Medical History:  Diagnosis Date  . Arthritis of foot   . Carotid artery occlusion   . Colon polyps    adenomatous  . Diverticulosis   . Esophageal stricture   . GERD (gastroesophageal reflux disease)    erosive esophagitis  . Hypercholesterolemia   . Lactose intolerance   . Sarcoidosis 09/20/2007   Qualifier: Diagnosis of  By: Lenn Cal Deborra Medina), Wynona Canes    . Skin cancer   . Stroke [facility]) June 2013   Mini  Patient Active Problem List   Diagnosis Date Noted  . Polyarthralgia 07/04/2018  . CKD (chronic kidney disease) stage 3, GFR 30-59 ml/min 03/29/2018  . Gastroesophageal reflux disease with esophagitis 03/28/2018  . Essential hypertension 03/07/2017  . Hyperlipidemia 10/26/2011  . Erectile dysfunction 10/26/2011  . History of skin cancer 10/26/2011    . Carotid stenosis 07/31/2011   Past Surgical History:  Procedure Laterality Date  . CAROTID ENDARTERECTOMY Right August 03, 2011   CE  . COLONOSCOPY     [place]eras ago [place], normal per pt  . ENDARTERECTOMY  08/03/2011   Procedure: ENDARTERECTOMY CAROTID;  Surgeon: Angelia Mould, MD;  Location: [facility];  Service: Vascular;  Laterality: Right;  . LUMBAR LAMINECTOMY/DECOMPRESSION MICRODISCECTOMY Bilateral 02/23/2014   Procedure: BILATERAL LUMBAR THREE-FOUR LAMINOTOMY AND MICRODISCECTOMY WITH RIGHT LUMBAR FOUR-FIVE LAMINOTOMY AND MICRODISCECTOMY;  Surgeon: Hosie Spangle, MD;  Location: [facility];  Service: Neurosurgery;  Laterality: Bilateral;  Bilateral L34 laminotomy with Left L34 microdiskectomy and possible Right L34 microdiskectomy and Right L45 Laminotomy and foraminotomy  . MOHS SURGERY     to face  . TONSILLECTOMY      Family History  Problem Relation Age of Onset  . Lupus Mother   . Stroke Mother   . Lupus Sister   . Colon cancer Neg Hx   . Colon polyps Neg Hx   . Rectal cancer Neg Hx   . Stomach cancer Neg Hx   . Esophageal cancer Neg Hx   . Pancreatic cancer Neg Hx   . Liver disease Neg Hx   . Kidney disease Neg Hx     Medications- reviewed and updated Current Outpatient Medications  Medication Sig Dispense Refill  . atorvastatin (LIPITOR) 20 MG tablet Take 1 tablet (20 mg total) by mouth daily at 6 PM. Take 1 tablet by mouth  every day at 6 pm 90 tablet 3  . ibuprofen (ADVIL,MOTRIN) 200 MG tablet Take 200 mg by mouth every 6 (six) hours as needed.    Marland Kitchen omeprazole (PRILOSEC) 40 MG capsule Take 1 capsule (40 mg total) by mouth daily. 90 capsule 3  . sildenafil (VIAGRA) 100 MG tablet Take 0.5-1 tablets (50-100 mg total) by mouth daily as needed for erectile dysfunction. 30 tablet 11   No current facility-administered medications for this visit.    Allergies-reviewed and updated Allergies  Allergen Reactions  . Morphine And Related Nausea And Vomiting     Gi upset  . Other     Bee sting - swelling in the areas stung    Social History   Socioeconomic History  . Marital status: Married    Spouse name: Not on file  . Number of children: Not on file  . Years of education: Not on file  . Highest education level: Not on file  Occupational History  . Not on file  Tobacco Use  . Smoking status: Never Smoker  . Smokeless tobacco: Never Used  Substance and Sexual Activity  . Alcohol use: Yes    Alcohol/week: 0.0 standard drinks    Comment: 1-2 drinks on weekends  . Drug use: No  . Sexual activity: Not on file  Other Topics Concern  . 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  Finished high school.  Can read and write.  Hasn't seen a doctor in many years.  Social Determinants of Health   Financial Resource Strain:   . Difficulty of Paying Living Expenses: Not on file  Food Insecurity:   . Worried About Charity fundraiser in the Last Year: Not on file  . Ran Out of Food in the Last Year: Not on file  Transportation Needs:   . Lack of Transportation (Medical): Not on file  . Lack of Transportation (Non-Medical): Not on file  Physical Activity:   . Days of Exercise per Week: Not on file  . Minutes of Exercise per Session: Not on file  Stress:   . Feeling of Stress : Not on file  Social Connections:   . Frequency of Communication with Friends and Family: Not on file  . Frequency of Social Gatherings with Friends and Family: Not on file  . Attends Religious Services: Not on file  . Active Member of Clubs or Organizations: Not on file  . Attends Archivist Meetings: Not on file  . Marital Status: Not on file        Objective:  Physical Exam: BP 110/62   Pulse 65   Temp (!) 97.2 F (36.2 C)   Ht 5' 11.5" (1.816 m)   Wt 201 lb 4 oz (91.3 kg)   SpO2 95%   BMI 27.68 kg/m   Body mass index is  27.68 kg/m. Wt Readings from Last 3 Encounters:  04/03/19 201 lb 4 oz (91.3 kg)  10/02/18 199 lb (90.3 kg)  04/06/18 201 lb (91.2 kg)   Gen: NAD, resting comfortably HEENT: TMs with clear effusion bilaterally.  OP clear. No thyromegaly noted.  CV: RRR with no murmurs appreciated Pulm: NWOB, CTAB with no crackles, wheezes, or rhonchi GI: Normal bowel sounds present. Soft, Nontender, Nondistended. MSK: no edema, cyanosis, or clubbing noted.  Limited internal and external rotation bilateral hips.  Neurovascular intact distally. Skin: warm, dry Neuro: CN2-12 grossly intact. Strength 5/5 in upper and lower extremities. Reflexes symmetric and intact bilaterally.  Psych: Normal affect and thought content     Sheryn Aldaz M. Jerline Pain, MD 04/03/2019 2:27 PM

## 2019-04-03 NOTE — Assessment & Plan Note (Signed)
Stable.  Continue Lipitor 20 mg daily.  Check lipid panel, CBC, C met, and TSH.

## 2019-04-03 NOTE — Assessment & Plan Note (Signed)
Check C met today.

## 2019-04-03 NOTE — Assessment & Plan Note (Signed)
Will place referral to see sports medicine.  Likely has underlying OA.

## 2019-04-03 NOTE — Assessment & Plan Note (Signed)
At goal off meds.

## 2019-04-03 NOTE — Assessment & Plan Note (Signed)
Stable.  Continue Prilosec 40 mg daily. 

## 2019-04-05 ENCOUNTER — Telehealth: Payer: Self-pay

## 2019-04-05 ENCOUNTER — Other Ambulatory Visit: Payer: Self-pay

## 2019-04-05 ENCOUNTER — Encounter: Payer: Self-pay | Admitting: Family Medicine

## 2019-04-05 DIAGNOSIS — R739 Hyperglycemia, unspecified: Secondary | ICD-10-CM | POA: Insufficient documentation

## 2019-04-05 LAB — HEMOGLOBIN A1C: Hgb A1c MFr Bld: 6.3 % (ref 4.6–6.5)

## 2019-04-05 MED ORDER — METFORMIN HCL ER 500 MG PO TB24: 500.0000 mg | ORAL_TABLET | Freq: Every day | ORAL | 1 refills | Status: DC

## 2019-04-05 NOTE — Progress Notes (Signed)
Please inform patient of the following:  Blood sugar increased since last time but all of his other lab work is NORMAL. Recommend starting metformin 500mg  daily to improve blood sugar and lower risk of progressing to diabetes. Regardless, he should continue to work on diet and exercise and we can recheck in a year or so.

## 2019-04-05 NOTE — Telephone Encounter (Signed)
Patient returning call regarding lab results. Please follow up with patient

## 2019-04-06 NOTE — Telephone Encounter (Signed)
Notified. 

## 2019-04-10 ENCOUNTER — Telehealth (HOSPITAL_COMMUNITY): Payer: Self-pay

## 2019-04-10 ENCOUNTER — Other Ambulatory Visit: Payer: Self-pay | Admitting: *Deleted

## 2019-04-10 DIAGNOSIS — I6523 Occlusion and stenosis of bilateral carotid arteries: Secondary | ICD-10-CM

## 2019-04-10 NOTE — Telephone Encounter (Signed)

## 2019-04-12 ENCOUNTER — Other Ambulatory Visit: Payer: Self-pay

## 2019-04-12 ENCOUNTER — Ambulatory Visit (HOSPITAL_COMMUNITY)
Admission: RE | Admit: 2019-04-12 | Discharge: 2019-04-12 | Disposition: A | Discharge status: Home / Self Care | Payer: Medicare Other | Source: Ambulatory Visit | Attending: Vascular Surgery | Admitting: Vascular Surgery

## 2019-04-12 ENCOUNTER — Ambulatory Visit (INDEPENDENT_AMBULATORY_CARE_PROVIDER_SITE_OTHER): Payer: Medicare Other | Admitting: Physician Assistant

## 2019-04-12 VITALS — BP 136/76 | HR 58 | Temp 98.0°F | Resp 16 | Ht 71.5 in | Wt 198.8 lb

## 2019-04-12 DIAGNOSIS — I6523 Occlusion and stenosis of bilateral carotid arteries: Secondary | ICD-10-CM

## 2019-04-12 NOTE — Progress Notes (Signed)
History of Present Illness:  Patient is a 74 y.o. year old male who presents for evaluation of carotid stenosis.  He is s/p right CEA by Dr. Scot Dock on 08/03/2011.   The patient denies symptoms of TIA, amaurosis, or stroke.  He has a hx of reflux and esophagitis and does not take aspirin daily.  He does take a daily statin and oral management of his DM II.    Past Medical History:  Diagnosis Date  . Arthritis of foot   . Carotid artery occlusion   . Colon polyps    adenomatous  . Diverticulosis   . Esophageal stricture   . GERD (gastroesophageal reflux disease)    erosive esophagitis  . Hypercholesterolemia   . Lactose intolerance   . Sarcoidosis 09/20/2007   Qualifier: Diagnosis of  By: Lenn Cal Deborra Medina), Wynona Canes    . Skin cancer   . Stroke [facility]) June 2013   Mini     Past Surgical History:  Procedure Laterality Date  . CAROTID ENDARTERECTOMY Right August 03, 2011   CE  . COLONOSCOPY     12 [place]eras ago [place], normal per pt  . ENDARTERECTOMY  08/03/2011   Procedure: ENDARTERECTOMY CAROTID;  Surgeon: Angelia Mould, MD;  Location: [place];  Service: Vascular;  Laterality: Right;  . LUMBAR LAMINECTOMY/DECOMPRESSION MICRODISCECTOMY Bilateral 02/23/2014   Procedure: BILATERAL LUMBAR THREE-FOUR LAMINOTOMY AND MICRODISCECTOMY WITH RIGHT LUMBAR FOUR-FIVE LAMINOTOMY AND MICRODISCECTOMY;  Surgeon: Hosie Spangle, MD;  Location: [place];  Service: Neurosurgery;  Laterality: Bilateral;  Bilateral L34 laminotomy with Left L34 microdiskectomy and possible Right L34 microdiskectomy and Right L45 Laminotomy and foraminotomy  . MOHS SURGERY     to face  . TONSILLECTOMY       Social History Social History   Tobacco Use  . Smoking status: Never Smoker  . Smokeless tobacco: Never Used  Substance Use Topics  . Alcohol use: Yes    Alcohol/week: 0.0 standard drinks    Comment: 1-2 drinks on weekends  . Drug use: No    Family History Family History  Problem Relation Age of  Onset  . Lupus Mother   . Stroke Mother   . Lupus Sister   . Colon cancer Neg Hx   . Colon polyps Neg Hx   . Rectal cancer Neg Hx   . Stomach cancer Neg Hx   . Esophageal cancer Neg Hx   . Pancreatic cancer Neg Hx   . Liver disease Neg Hx   . Kidney disease Neg Hx     Allergies  Allergies  Allergen Reactions  . Morphine And Related Nausea And Vomiting    Gi upset  . Other     Bee sting - swelling in the areas stung     Current Outpatient Medications  Medication Sig Dispense Refill  . atorvastatin (LIPITOR) 20 MG tablet Take 1 tablet (20 mg total) by mouth daily at 6 PM. Take 1 tablet by mouth  every day at 6 pm 90 tablet 3  . ibuprofen (ADVIL,MOTRIN) 200 MG tablet Take 200 mg by mouth every 6 (six) hours as needed.    . metFORMIN (GLUCOPHAGE-XR) 500 MG 24 hr tablet Take 1 tablet (500 mg total) by mouth at bedtime. 90 tablet 1  . omeprazole (PRILOSEC) 40 MG capsule Take 1 capsule (40 mg total) by mouth daily. 90 capsule 3  . sildenafil (VIAGRA) 100 MG tablet Take 0.5-1 tablets (50-100 mg total) by mouth daily as needed for  erectile dysfunction. 30 tablet 11   No current facility-administered medications for this visit.    ROS:   General:  No weight loss, Fever, chills  HEENT: No recent headaches, no nasal bleeding, no visual changes, no sore throat  Neurologic: No dizziness, blackouts, seizures. No recent symptoms of stroke or mini- stroke. No recent episodes of slurred speech, or temporary blindness.  Cardiac: No recent episodes of chest pain/pressure, no shortness of breath at rest.  No shortness of breath with exertion.  Denies history of atrial fibrillation or irregular heartbeat  Vascular: No history of rest pain in feet.  No history of claudication.  No history of non-healing ulcer, No history of DVT   Pulmonary: No home oxygen, no productive cough, no hemoptysis,  No asthma or wheezing  Musculoskeletal:  [ ]  Arthritis, [ ]  Low back pain,  [x ] Joint  pain  Hematologic:No history of hypercoagulable state.  No history of easy bleeding.  No history of anemia  Gastrointestinal: No hematochezia or melena,  No gastroesophageal reflux, no trouble swallowing  Urinary: [ ]  chronic Kidney disease, [ ]  on HD - [ ]  MWF or [ ]  TTHS, [ ]  Burning with urination, [ ]  Frequent urination, [ ]  Difficulty urinating;   Skin: No rashes  Psychological: No history of anxiety,  No history of depression   Physical Examination  Vitals:   04/12/19 1119 04/12/19 1121  BP: (!) 141/66 136/76  Pulse: (!) 58   Resp: 16   Temp: 98 F (36.7 C)   TempSrc: Oral   SpO2: 95%   Weight: 198 lb 12.8 oz (90.2 kg)   Height: 5' 11.5" (1.816 m)     Body mass index is 27.34 kg/m.  General:  Alert and oriented, no acute distress HEENT: Normal Neck: No bruit or JVD Pulmonary: Clear to auscultation bilaterally Cardiac: Regular Rate and Rhythm without murmur Gastrointestinal: Soft, non-tender, non-distended, no mass, no scars Skin: No rash Extremity Pulses:  2+ radial, brachial, femoral, dorsalis pedis, posterior tibial pulses bilaterally Musculoskeletal: No deformity or edema  Neurologic: Upper and lower extremity motor 5/5 and symmetric  DATA:     Right Carotid Findings:  +----------+--------+--------+--------+------------------+--------+       PSV cm/sEDV cm/sStenosisPlaque DescriptionComments  +----------+--------+--------+--------+------------------+--------+  CCA Prox 96   13                      +----------+--------+--------+--------+------------------+--------+  CCA Mid  84   14       heterogenous         +----------+--------+--------+--------+------------------+--------+  CCA Distal60   10       heterogenous         +----------+--------+--------+--------+------------------+--------+  ICA Prox 45   8    1-39%  heterogenous          +----------+--------+--------+--------+------------------+--------+  ICA Mid  57   17                      +----------+--------+--------+--------+------------------+--------+  ICA Distal72   26                      +----------+--------+--------+--------+------------------+--------+  ECA    125   6                       +----------+--------+--------+--------+------------------+--------+   +----------+--------+-------+----------------+-------------------+       PSV cm/sEDV cmsDescribe    Arm Pressure (mmHG)  +----------+--------+-------+----------------+-------------------+  [id number]  Multiphasic, WNL            +----------+--------+-------+----------------+-------------------+   +---------+--------+--+--------+--+---------+  VertebralPSV cm/s74EDV cm/s11Antegrade  +---------+--------+--+--------+--+---------+      Left Carotid Findings:  +----------+--------+--------+--------+------------------+--------+       PSV cm/sEDV cm/sStenosisPlaque DescriptionComments  +----------+--------+--------+--------+------------------+--------+  CCA Prox 104   25                      +----------+--------+--------+--------+------------------+--------+  CCA Mid  85   17       heterogenous         +----------+--------+--------+--------+------------------+--------+  CCA Distal79   19                      +----------+--------+--------+--------+------------------+--------+  ICA Prox 131   42   40-59% heterogenous         +----------+--------+--------+--------+------------------+--------+  ICA Mid  123   31                      +----------+--------+--------+--------+------------------+--------+  ICA Distal80    26                      +----------+--------+--------+--------+------------------+--------+  ECA    196   12       heterogenous         +----------+--------+--------+--------+------------------+--------+   +----------+--------+--------+----------------+-------------------+       PSV cm/sEDV cm/sDescribe    Arm Pressure (mmHG)  +----------+--------+--------+----------------+-------------------+  [id number]       Multiphasic, WNL            +----------+--------+--------+----------------+-------------------+   +---------+--------+--+--------+--+---------+  VertebralPSV cm/s72EDV cm/s11Antegrade  +---------+--------+--+--------+--+---------+         Summary:  Right Carotid: Velocities in the right ICA are consistent with a 1-39%  stenosis.         CEA patent.   Left Carotid: Velocities in the left ICA are consistent with a 40-59%  stenosis.   Vertebrals: Bilateral vertebral arteries demonstrate antegrade flow.  Subclavians: Normal flow hemodynamics were seen in bilateral subclavian        arteries.   ASSESSMENT:  Asymptomatic carotid stenosis s/p right CEA 2013 Left ICA with 40-59% stenosis per duplex today.  PLAN: He will be aware of signs and symptoms of stroke which was reviewed today.  He will return in 1 year for repeat carotid duplex and surveillance.    Roxy Horseman PA-C [facility] Office: [phone]  MD in clinic [place]

## 2019-04-13 ENCOUNTER — Other Ambulatory Visit: Payer: Self-pay | Admitting: *Deleted

## 2019-04-13 DIAGNOSIS — I6523 Occlusion and stenosis of bilateral carotid arteries: Secondary | ICD-10-CM

## 2019-04-16 ENCOUNTER — Ambulatory Visit: Payer: Medicare Other | Admitting: Family Medicine

## 2019-04-16 ENCOUNTER — Encounter: Payer: Self-pay | Admitting: Family Medicine

## 2019-04-16 ENCOUNTER — Other Ambulatory Visit: Payer: Self-pay

## 2019-04-16 VITALS — BP 128/70 | HR 75 | Ht 71.5 in | Wt 198.0 lb

## 2019-04-16 DIAGNOSIS — M7061 Trochanteric bursitis, right hip: Secondary | ICD-10-CM | POA: Diagnosis not present

## 2019-04-16 DIAGNOSIS — I6523 Occlusion and stenosis of bilateral carotid arteries: Secondary | ICD-10-CM | POA: Diagnosis not present

## 2019-04-16 DIAGNOSIS — M2550 Pain in unspecified joint: Secondary | ICD-10-CM

## 2019-04-16 NOTE — Progress Notes (Signed)
Subjective:    CC: B hip pain  I, Molly Weber, LAT, ATC, am serving as scribe for Dr. Lynne Leader.  HPI: Pt is a 74 y/o male presenting w/ c/o B hip x 6 months.  He locates his pain to his R lateral hip.  He rates his pain at a 6-7/10 at it's worst and 0/10 at rest that he describes as sharp.  Pain located lateral hip worse with activity.  Better with rest.  He denies significant pain radiating below the level of the thigh.  No injury no weakness or numbness distally.  B hip pain (R>L):  -Radiating pain: yes, to mid-thigh -Numbness/tingling: -Aggravating factors: Walking; rotational activity -Treatments tried: IBU   Pertinent review of Systems: No fevers or chills.  Relevant historical information: Hypertension, hyperlipidemia, CKD. Recently diagnosed with carotid artery stenosis with duplex arterial ultrasound listed below Medications include atorvastatin.  Objective:    Vitals:   04/16/19 1301  BP: 128/70  Pulse: 75  SpO2: 98%   General: Well Developed, well nourished, and in no acute distress.   MSK: Right hip: Normal-appearing normal motion. Tender palpation greater trochanter. Hip abductor strength 4/5.  External rotation strength 4/5.  Internal rotation adduction strength 5/5. Pain with resisted hip abduction. Pain with FADIR test.  Left hip normal-appearing normal motion. Nontender. Strength also decreased abduction and external rotation 4/5.  Normal internal rotation and adduction 5/5.  Leg lengths equal. Normal gait.  Lab and Radiology Results VAS US CAROTID  Result Date: 04/12/2019 Carotid Arterial Duplex Study Indications:       Right carotid endarterectomy on 08/03/2011. Risk Factors:      Hyperlipidemia. Comparison Study:  04/06/18: Right CEA patent. Bilateral 1-39% ICA stenosis. Performing Technologist: Ralene Cork RVT  Examination Guidelines: A complete evaluation includes B-mode imaging, spectral Doppler, color Doppler, and power Doppler as  needed of all accessible portions of each vessel. Bilateral testing is considered an integral part of a complete examination. Limited examinations for reoccurring indications may be performed as noted.  Right Carotid Findings: +----------+--------+--------+--------+------------------+--------+           PSV cm/sEDV cm/sStenosisPlaque DescriptionComments +----------+--------+--------+--------+------------------+--------+ CCA Prox  96      13                                         +----------+--------+--------+--------+------------------+--------+ CCA Mid   84      14              heterogenous               +----------+--------+--------+--------+------------------+--------+ CCA Distal60      10              heterogenous               +----------+--------+--------+--------+------------------+--------+ ICA Prox  45      8       1-39%   heterogenous               +----------+--------+--------+--------+------------------+--------+ ICA Mid   57      17                                         +----------+--------+--------+--------+------------------+--------+ ICA Distal72      26                                         +----------+--------+--------+--------+------------------+--------+  ECA       125     6                                          +----------+--------+--------+--------+------------------+--------+ +----------+--------+-------+----------------+-------------------+           PSV cm/sEDV cmsDescribe        Arm Pressure (mmHG) +----------+--------+-------+----------------+-------------------+ [id number]             Multiphasic, WNL                    +----------+--------+-------+----------------+-------------------+ +---------+--------+--+--------+--+---------+ VertebralPSV cm/s74EDV cm/s11Antegrade +---------+--------+--+--------+--+---------+  Left Carotid Findings: +----------+--------+--------+--------+------------------+--------+            PSV cm/sEDV cm/sStenosisPlaque DescriptionComments +----------+--------+--------+--------+------------------+--------+ CCA Prox  104     25                                         +----------+--------+--------+--------+------------------+--------+ CCA Mid   85      17              heterogenous               +----------+--------+--------+--------+------------------+--------+ CCA Distal79      19                                         +----------+--------+--------+--------+------------------+--------+ ICA Prox  131     42      40-59%  heterogenous               +----------+--------+--------+--------+------------------+--------+ ICA Mid   123     31                                         +----------+--------+--------+--------+------------------+--------+ ICA Distal80      26                                         +----------+--------+--------+--------+------------------+--------+ ECA       196     12              heterogenous               +----------+--------+--------+--------+------------------+--------+ +----------+--------+--------+----------------+-------------------+           PSV cm/sEDV cm/sDescribe        Arm Pressure (mmHG) +----------+--------+--------+----------------+-------------------+ [id number]             Multiphasic, WNL                    +----------+--------+--------+----------------+-------------------+ +---------+--------+--+--------+--+---------+ VertebralPSV cm/s72EDV cm/s11Antegrade +---------+--------+--+--------+--+---------+   Summary: Right Carotid: Velocities in the right ICA are consistent with a 1-39% stenosis.                CEA patent. Left Carotid: Velocities in the left ICA are consistent with a 40-59% stenosis. Vertebrals:  Bilateral vertebral arteries demonstrate antegrade flow. Subclavians: Normal flow hemodynamics were seen in bilateral subclavian              arteries. *See table(s) above  for measurements and observations.  Electronically signed by Ruta Hinds MD on 04/12/2019 at 12:19:51 PM.    Final    Report independently reviewed and verified.  Images not available at the time of this note.    Impression and Recommendations:    Assessment and Plan: 74 y.o. male with  Right lateral hip pain.  Hip abductor tendinopathy/trochanteric bursitis.  No evidence of hip DJD on exam today.  Plan for home exercise program as taught in clinic today by ATC.  If not improving patient will notify me we will proceed proceed with physical therapy referral.  Recheck back in 1 month.  Return sooner if needed.  [id number]; 15 additional minutes spent for Therapeutic exercises as stated in above notes.  This included exercises focusing on stretching, strengthening, with significant focus on eccentric aspects.   Long term goals include an improvement in range of motion, strength, endurance as well as avoiding reinjury. Patient's frequency would include in 1-2 times a day, 3-5 times a week for a duration of 6-12 weeks.  Proper technique shown and discussed handout in great detail with ATC.  All questions were discussed and answered.      Discussed warning signs or symptoms. Please see discharge instructions. Patient expresses understanding.   The above documentation has been reviewed and is accurate and complete Lynne Leader

## 2019-04-16 NOTE — Patient Instructions (Addendum)
Thank you for coming in today. I think you have trochanteric bursitis or hip abductor tenonitis.  Do the exercise we reviewed.  If not improving let me know and I will arrange for formal physical therapy.  Recheck in 4 weeks.  Return sooner if needed .  Please perform the exercise program that we have prepared for you and gone over in detail on a daily basis.  In addition to the handout you were provided you can access your program through: [email or web address]   Your unique program code is: [email or web address]

## 2019-04-23 ENCOUNTER — Other Ambulatory Visit: Payer: Self-pay | Admitting: Family Medicine

## 2019-05-16 ENCOUNTER — Encounter: Payer: Self-pay | Admitting: Family Medicine

## 2019-05-17 ENCOUNTER — Encounter: Payer: Self-pay | Admitting: Family Medicine

## 2019-05-17 ENCOUNTER — Other Ambulatory Visit: Payer: Self-pay

## 2019-05-17 ENCOUNTER — Ambulatory Visit (INDEPENDENT_AMBULATORY_CARE_PROVIDER_SITE_OTHER): Payer: Medicare Other

## 2019-05-17 ENCOUNTER — Ambulatory Visit: Payer: Medicare Other | Admitting: Family Medicine

## 2019-05-17 ENCOUNTER — Ambulatory Visit: Payer: Self-pay

## 2019-05-17 VITALS — BP 130/68 | HR 78 | Ht 71.5 in | Wt 197.8 lb

## 2019-05-17 DIAGNOSIS — M25552 Pain in left hip: Secondary | ICD-10-CM

## 2019-05-17 DIAGNOSIS — M5416 Radiculopathy, lumbar region: Secondary | ICD-10-CM

## 2019-05-17 DIAGNOSIS — M25551 Pain in right hip: Secondary | ICD-10-CM

## 2019-05-17 DIAGNOSIS — M160 Bilateral primary osteoarthritis of hip: Secondary | ICD-10-CM | POA: Diagnosis not present

## 2019-05-17 DIAGNOSIS — M5136 Other intervertebral disc degeneration, lumbar region: Secondary | ICD-10-CM | POA: Diagnosis not present

## 2019-05-17 MED ORDER — GABAPENTIN 300 MG PO CAPS: 300.0000 mg | ORAL_CAPSULE | Freq: Every evening | ORAL | 1 refills | Status: DC | PRN

## 2019-05-17 NOTE — Patient Instructions (Signed)
Thank you for coming in today. This could be trochanteric bursitis or pinched nerve in the back or both.  Get xray today.  Let me know how you feel after the shot in about 1 week.  If great reasonable to inject the left hip.  Use gabapentin at night for nerve pain as needed.  Otherwise recheck in 1 month.  Contact me sooner if not doing well.

## 2019-05-17 NOTE — Progress Notes (Signed)
I, Wendy Poet, LAT, ATC, am serving as scribe for Dr. Lynne Leader.  Eduardo Shaw is a 74 y.o. male who presents to [place] at [facility] today for f/u of B hip pain, R >L w/ radiating pain from his lateral hip to his R mid-thigh.  He was last seen by Dr. Georgina Snell on 04/16/19 and was provided w/ a HEP focusing on hip aBd strengthening and ITB stretching.  Since his last visit, pt reports that both hips are bothering him and is also having radiating pain into B LEs past the knee.  Pain radiates to the lateral calves bilaterally.  He denies any back pain and denies any sensory changes in his B LEs.  He reports some sporadic knee buckling if he "makes the wrong move."  He has noticed that his knees will buckle in the morning when he extends his neck while gargling his mouthwash.   Pertinent review of systems: No fevers or chills  Relevant historical information: History of prior back surgery 2016   Exam:  BP 130/68 (BP Location: Right Arm, Patient Position: Sitting, Cuff Size: Large)   Pulse 78   Ht 5' 11.5" (1.816 m)   Wt 197 lb 12.8 oz (89.7 kg)   SpO2 94%   BMI 27.20 kg/m  General: Well Developed, well nourished, and in no acute distress.   MSK: L-spine nontender normal lumbar motion.  Negative slump test bilaterally. Hips bilaterally normal-appearing normal motion. Nontender at lateral hip both sides. Hip abduction strength diminished 4/5 right and left. Hip external rotation strength intact 5/5 bilaterally. Normal gait.    Lab and Radiology Results  X-ray images L-spine and AP pelvis obtained today personally and independently reviewed.  L-spine: Significant DDD L4-L5 and severe DDD versus fusion at L5-S1.  Significant facet DJD L4-L5 and L5-S1 with neuroforaminal stenosis. No acute fractures or malalignment.  AP pelvis: Hips bilaterally have mild to moderate DJD.  No acute fracture.  Await formal radiology review  Diagnostic Limited MSK Ultrasound  of: Right lateral hip Mild to moderate trochanter bursitis present with increased bursa thickness overlying the greater trochanter. Impression: Trochanteric bursitis right   Procedure: Real-time Ultrasound Guided Injection of right greater trochanter Device: Philips Affiniti 50G Images permanently stored and available for review in the ultrasound unit. Verbal informed consent obtained.  Discussed risks and benefits of procedure. Warned about infection bleeding damage to structures skin hypopigmentation and fat atrophy among others. Patient expresses understanding and agreement Time-out conducted.   Noted no overlying erythema, induration, or other signs of local infection.   Skin prepped in a sterile fashion.   Local anesthesia: Topical Ethyl chloride.   With sterile technique and under real time ultrasound guidance:  40 mg of Kenalog and 3 mL of Marcaine injected easily.   Completed without difficulty   Pain did not change much following injection Advised to call if fevers/chills, erythema, induration, drainage, or persistent bleeding.   Images permanently stored and available for review in the ultrasound unit.  Impression: Technically successful ultrasound guided injection.     Assessment and Plan: 74 y.o. male with bilateral lateral hip pain associated with some pain radiating down the legs to the lateral calf.  Presentation of symptoms physical exam findings and diagnostic x-ray and ultrasound exam findings are somewhat confusing.  Patient symptoms are more consistent with trochanteric bursitis however he is not very tender to palpation in this region.  He does have weak hip abductors and has failed initial conservative management.  Plan for trial of diagnostic and hopefully therapeutic injection to the right greater trochanter today along with referral to physical therapy.  If he gets great results on the injection reasonable to proceed with left-sided injection as well.  Patient  does have some pain in the lateral calves both sides as well.  He does have a history of back surgery and does have significant degenerative changes in his lumbar spine in this region.  Trial of gabapentin as well as physical therapy.    If not better may proceed with further advanced diagnostic imaging such as MRI.  If all is well recheck in a month.  Return or contact me sooner if needed.    Orders Placed This Encounter  Procedures  . DG Lumbar Spine 2-3 Views    Standing Status:   Future    Number of Occurrences:   1    Standing Expiration Date:   07/16/2020    Order Specific Question:   Reason for Exam (SYMPTOM  OR DIAGNOSIS REQUIRED)    Answer:   eval poss L5 radicular pain    Order Specific Question:   Preferred imaging location?    Answer:   [facility]    Order Specific Question:   Radiology Contrast Protocol - do NOT remove file path    Answer:   \\[email or web address]  . DG Pelvis 1-2 Views    Standing Status:   Future    Number of Occurrences:   1    Standing Expiration Date:   07/16/2020    Order Specific Question:   Reason for Exam (SYMPTOM  OR DIAGNOSIS REQUIRED)    Answer:   eval Poss lateral hp pain b/l    Order Specific Question:   Preferred imaging location?    Answer:   [facility]    Order Specific Question:   Radiology Contrast Protocol - do NOT remove file path    Answer:   \\[email or web address].pdf  . [place] LIMITED JOINT SPACE STRUCTURES LOW BILAT(NO LINKED CHARGES)    Order Specific Question:   Reason for Exam (SYMPTOM  OR DIAGNOSIS REQUIRED)    Answer:   eval BL Lat hip pain    Order Specific Question:   Preferred imaging location?    Answer:   [facility]  . Ambulatory referral to Physical Therapy    Referral Priority:   Routine    Referral Type:   Physical Medicine    Referral Reason:   Specialty Services Required    Requested Specialty:   Physical  Therapy   Meds ordered this encounter  Medications  . gabapentin (NEURONTIN) 300 MG capsule    Sig: Take 1 capsule (300 mg total) by mouth at bedtime as needed.    Dispense:  90 capsule    Refill:  1     Discussed warning signs or symptoms. Please see discharge instructions. Patient expresses understanding.   The above documentation has been reviewed and is accurate and complete Lynne Leader

## 2019-05-18 NOTE — Progress Notes (Signed)
X-ray hips show mild arthritis both hips.  This probably is not contributing to your pain

## 2019-05-18 NOTE — Progress Notes (Signed)
X-ray lumbar spine does show quite a bit of arthritis and narrowing.  This could potentially cause some of the pain down your leg.  If not better we may consider MRI to look into this further if needed.

## 2019-05-23 ENCOUNTER — Telehealth: Payer: Self-pay | Admitting: Family Medicine

## 2019-05-23 ENCOUNTER — Other Ambulatory Visit: Payer: Self-pay

## 2019-05-23 ENCOUNTER — Ambulatory Visit: Payer: Medicare Other | Admitting: Family Medicine

## 2019-05-23 VITALS — BP 140/80 | HR 76 | Ht 71.5 in | Wt 192.6 lb

## 2019-05-23 DIAGNOSIS — M5416 Radiculopathy, lumbar region: Secondary | ICD-10-CM

## 2019-05-23 DIAGNOSIS — R29898 Other symptoms and signs involving the musculoskeletal system: Secondary | ICD-10-CM | POA: Diagnosis not present

## 2019-05-23 MED ORDER — PREDNISONE 10 MG (48) PO TBPK: ORAL_TABLET | Freq: Every day | ORAL | 0 refills | Status: DC

## 2019-05-23 NOTE — Telephone Encounter (Signed)
Lmom for pt to call back and schedule a PT appt.

## 2019-05-23 NOTE — Progress Notes (Signed)
I, Wendy Poet, LAT, ATC, am serving as scribe for Dr. Lynne Leader.  Eduardo Shaw is a 75 y.o. male who presents to [place] at [facility] today for f/u of B leg pain, L>R, and new low back pain.  Pt was last seen by Dr. Georgina Snell on 05/17/19 for B hip pain (R>L) and had a R GT injection.  He also reported worsening B LE pain w/ pain moving more distally to include B calf pain.  He was prescribed Gabapentin.  Since his last visit, he notes worsening L leg pain and low back pain since yesterday.  He states that he could not walk this morning when he tried to get out of bed.  His pain has slightly improved since this morning.  He has hx of lumbar L3-5 disectomy in 2013-14.  He denies any numbness/tingling into his L leg.  L leg pain runs from his lower back into his L thigh and stops at the knee.  He reports L leg weakness.  He denies any change in terms of bowel/bladder function.  Diagnostic imaging: L-spine and pelvis XR- 05/17/19  Pertinent review of systems: No fevers or chills  Relevant historical information: History of prior lumbar surgery 2016.   Exam:  BP 140/80 (BP Location: Left Arm, Patient Position: Sitting, Cuff Size: Large)   Pulse 76   Ht 5' 11.5" (1.816 m)   Wt 192 lb 9.6 oz (87.4 kg)   SpO2 96%   BMI 26.49 kg/m  General: Well Developed, well nourished, and in no acute distress.   MSK: L-spine nontender midline.  Tender palpation lumbar paraspinal musculature. Significantly decreased lumbar motion. Lower extremity reflexes equal bilateral extremity. Sensation is intact throughout. Strength intact bilateral lower extremities with exception of left knee extension 4/5 compared to right 5/5.  Strength otherwise intact throughout. Antalgic gait present.    Lab and Radiology Results EXAM: LUMBAR SPINE - 2-3 VIEW  COMPARISON:  March 18, 2014  FINDINGS: There is no evidence of acute lumbar spine fracture. Alignment is normal. Moderate to marked  severity endplate sclerosis is seen at the levels of L3-L4, L4-L5 and L5-S1. Marked severity intervertebral disc space narrowing is seen at the levels of L4-L5 and L5-S1 with moderate severity intervertebral disc space narrowing noted at the level of the L3-L4. There is moderate severity calcification of the abdominal aorta.  IMPRESSION: 1. No evidence of acute lumbar spine fracture or malalignment. 2. Advanced degenerative disc disease in the lower lumbar spine.   Electronically Signed   By: Virgina Norfolk M.D.   On: 05/17/2019 23:20  EXAM: PELVIS - 1-2 VIEW  COMPARISON:  None.  FINDINGS: There is no evidence of an acute pelvic fracture or diastasis. Mild sclerotic changes are seen involving the right acetabulum and left acetabulum with mild narrowing bilateral hip joints. Chronic changes are seen involving the greater trochanters, bilaterally. Moderate to marked severity degenerative changes seen within the visualized portion of the lower lumbar spine.  IMPRESSION: 1. No acute findings. 2. Mild degenerative changes involving both hips.   Electronically Signed   By: Virgina Norfolk M.D.   On: 05/17/2019 23:26  I, Lynne Leader, personally (independently) visualized and performed the interpretation of the images attached in this note.    Assessment and Plan: 74 y.o. male with worsening acute low back pain with progressive neurological symptoms. Patient has bilateral radicular pain and now has a bit of left leg weakness.  Weakness is not severe and patient mentions is  already improving from this morning.  He does not have bowel or bladder dysfunction.  At this point I think we should proceed with MRI to evaluate the progressive neurological symptoms.  Discussed the difference tween a emergency and urgency MRI.  I feel confident that I can get him an MRI in the next few days in the outpatient setting however if his symptoms progress or he develops worsening  weakness we will proceed with emergency stat MRI in the hospital. Additionally will prescribe course of prednisone which has not had yet and also patient will start taking the gabapentin that was prescribed at the last visit.  We discussed hydrocodone for pain control and he would like to defer for this for now.  Recheck following MRI.  Lengthy discussion regarding precautions.  Recheck back sooner if needed.   PDMP reviewed during this encounter. Orders Placed This Encounter  Procedures  . MR Lumbar Spine Wo Contrast    Standing Status:   Future    Standing Expiration Date:   07/22/2020    Order Specific Question:   What is the patient's sedation requirement?    Answer:   No Sedation    Order Specific Question:   Does the patient have a pacemaker or implanted devices?    Answer:   No    Order Specific Question:   Preferred imaging location?    Answer:   [facility] (table limit-350lbs)    Order Specific Question:   Radiology Contrast Protocol - do NOT remove file path    Answer:   \\[email or web address]\Radiant\[email or web address]   Meds ordered this encounter  Medications  . predniSONE (STERAPRED UNI-PAK 48 TAB) 10 MG (48) TBPK tablet    Sig: Take by mouth daily. 12 day dosepack po    Dispense:  48 tablet    Refill:  0     Discussed warning signs or symptoms. Please see discharge instructions. Patient expresses understanding.   The above documentation has been reviewed and is accurate and complete Lynne Leader

## 2019-05-23 NOTE — Telephone Encounter (Signed)
Patient called stating that he was here to see Dr Georgina Snell last week. Dr Georgina Snell told him to call if anything changes with his condition.  He said that this morning when he went to get out of bed he could hardly walk. He is now hobbling around and in a lot of pain.  Please advise.

## 2019-05-23 NOTE — Telephone Encounter (Signed)
Returned pt's call and his now having significant low back pain and disabling L leg pain to the point that he's having difficulty moving/walking.  R hip and leg feel a little improved after getting R GT injection on 05/17/19.  Scheduled pt for f/u visit this afternoon at 2:30 pm.

## 2019-05-23 NOTE — Patient Instructions (Addendum)
Thank you for coming in today. Plan for MRI.  Plan for PT Take prednisone and gabapentin.  Recheck following MRI.  If you significantly worsen especially new and progressive weakness or bowel or bladder dysfunction let me know. That is a reason to turn the MRI into STAT MRI.  Keep me updated.

## 2019-05-26 ENCOUNTER — Other Ambulatory Visit: Payer: Self-pay

## 2019-05-26 ENCOUNTER — Ambulatory Visit (INDEPENDENT_AMBULATORY_CARE_PROVIDER_SITE_OTHER): Payer: Medicare Other

## 2019-05-26 DIAGNOSIS — R29898 Other symptoms and signs involving the musculoskeletal system: Secondary | ICD-10-CM | POA: Diagnosis not present

## 2019-05-26 DIAGNOSIS — M5416 Radiculopathy, lumbar region: Secondary | ICD-10-CM | POA: Diagnosis not present

## 2019-05-26 DIAGNOSIS — M545 Low back pain: Secondary | ICD-10-CM | POA: Diagnosis not present

## 2019-05-28 ENCOUNTER — Telehealth: Payer: Self-pay

## 2019-05-28 NOTE — Telephone Encounter (Signed)
Patient would like to do the phone call first and then if needed will schedule an in person visit.

## 2019-05-28 NOTE — Telephone Encounter (Signed)
LVM with Dr. Clovis Riley thoughts and concerns from below

## 2019-05-28 NOTE — Telephone Encounter (Signed)
Patient returned your call. He would like to discuss this further and asked if you could call him back at your convince.

## 2019-05-28 NOTE — Telephone Encounter (Signed)
Given weakness and concern for cauda equina syndrome would recommend follow-up in person however could be done over the phone if really needed.  In person would be better.

## 2019-05-28 NOTE — Progress Notes (Signed)
MRI lumbar spine shows a large bulging disc at L2-3 compressing multiple nerve roots.  Schedule follow-up appointment with me.  Based on the severity of the findings reasonable to follow-up with your neurosurgeon as well.  Would you like me to place a referral?

## 2019-05-28 NOTE — Telephone Encounter (Signed)
Patient scheduled for MRI follow up tomorrow and was wondering if he could do this over the phone or does he need to come in.

## 2019-05-29 ENCOUNTER — Ambulatory Visit (INDEPENDENT_AMBULATORY_CARE_PROVIDER_SITE_OTHER): Payer: Medicare Other | Admitting: Family Medicine

## 2019-05-29 DIAGNOSIS — M5416 Radiculopathy, lumbar region: Secondary | ICD-10-CM

## 2019-05-29 NOTE — Progress Notes (Addendum)
Virtual Visit  via Video Note  I connected with      Eduardo Shaw by a telemedicine application and verified that I am speaking with the correct person using two identifiers.   I discussed the limitations of evaluation and management by telemedicine and the availability of in person appointments. The patient expressed understanding and agreed to proceed.  History of Present Illness: Eduardo Shaw is a 74 y.o. male who would like to discuss L-spine MRI results.  He was last seen by Dr. Georgina Snell on 05/23/19 w/ worsening B LE pain, L>R, and low back pain.  He denied any numbness/tingling into his B LEs but did have LE weakness.  He was prescribed a steroid dose pack and was referred for a lumbar MRI.  Since his last visit, pt reports that he is feeling a bit better.  He notes his pain has eased up a bit and he denies any further weakness.  He notes his leg feels slightly unstable but denies any new or worsening weakness.  He denies any new bowel or bladder dysfunction or saddle anesthesia.  He denies any blood thinners.     Observations/Objective: Alert and oriented normal speech thought process and affect.  Lab and Radiology Results No results found for this or any previous visit (from the past 72 hour(s)). MR Lumbar Spine Wo Contrast  Result Date: 05/26/2019 CLINICAL DATA:  Low back pain for 2 months extending down the left leg EXAM: MRI LUMBAR SPINE WITHOUT CONTRAST TECHNIQUE: Multiplanar, multisequence MR imaging of the lumbar spine was performed. No intravenous contrast was administered. COMPARISON:  02/21/2014 and radiographs from 05/17/2019 FINDINGS: Segmentation: The lowest lumbar type non-rib-bearing vertebra is labeled as L5. There is interbody fusion at L5-S1. Alignment:  No vertebral subluxation is observed. Vertebrae: Interbody fusion at L5-S1. Disc desiccation throughout the rest of the lumbar spine with loss of disc height at L3-4 and L4-5. Degenerative endplate findings at  [id number]. Conus medullaris and cauda equina: Conus extends to the L1 level. Conus and cauda equina appear normal. There is some bunching of the cauda equina above the central narrowing of the thecal sac at the L2-3 level. Thin fatty filum. Paraspinal and other soft tissues: Unremarkable Disc levels: L1-2: Mild left and borderline right foraminal stenosis with mild left subarticular lateral recess stenosis due to disc bulge and facet arthropathy. This is worsened compared to prior. L2-3: Markedly severe central narrowing of the thecal sac with prominent bilateral subarticular lateral recess stenosis due to a central disc protrusion extending caudad, facet arthropathy, and ligamentum flavum redundancy. The thecal sac is nearly completely effaced at this level. This is new compared to the prior exam. L3-4: Mild bilateral foraminal stenosis with mild left subarticular lateral recess stenosis due to facet and intervertebral spurring. Bilateral laminectomies at this level. Previous impingement improved compared to prior following operative intervention. L4-5: Moderate bilateral foraminal stenosis and mild left subarticular lateral recess stenosis along with borderline central narrowing of the thecal sac due to disc bulge, left lateral recess disc protrusion, intervertebral spurring, and facet spurring. Prior right laminectomy. Previous right-sided impingement has improved compared to prior. L5-S1: Mild right subarticular lateral recess stenosis due to intervertebral spurring. IMPRESSION: 1. The dominant finding is markedly severe central narrowing of the thecal sac at L2-3 due to a large disc protrusion extending caudad along with facet arthropathy and ligamentum flavum redundancy. The thecal sac is nearly completely effaced at this level which could predispose to cauda equina syndrome, correlate with  clinical presentation. 2. Lumbar spondylosis and degenerative disc disease otherwise cause moderate impingement at L4-5 and  mild impingement at L1-2, L2-3, and L5-S1. These results will be called to the ordering clinician or representative by the Radiologist Assistant, and communication documented in the PACS or [employer]. Electronically Signed   By: Van Clines M.D.   On: 05/26/2019 15:41   I, Lynne Leader, personally (independently) visualized and performed the interpretation of the images attached in this note.   Assessment and Plan: 74 y.o. male with lumbar radiculopathy with concerning appearing bulging disc at L2-3 potentially impacting multiple nerve roots.  Patient had a history of prior discectomy at L3-4 by Dr. Sherwood Gambler.  In the interim he has actually called Dr. Donnella Bi office who would like to schedule him for surgical consultation and potentially surgery.  He notes that Dr. Sherwood Gambler will be retiring at the end of April and is a little apprehensive about having surgery right before his surgeon retires.  I expressed this is a very valid concern.  He should still schedule with either Dr. Sherwood Gambler or one of his partners and express his concerns.  A reasonable surgeon should understand them.  I also offered a second surgical opinion with Dr. Lynann Bologna at [facility].  That is a reasonable option.  Lastly discussed possibility of epidural steroid injection.  I am not entirely optimistic about this but given that he does not have significantly progressive weakness he may get benefit from epidural steroid injection.   We will order epidural steroid injection as well as referral to Dr. Lynann Bologna.  Recommend that he does follow-up with his neurosurgeon as well.  Second opinions are always a good idea in situations like this.  Discussed warning signs or symptoms including progressive weakness saddle anesthesia or bowel or bladder dysfunction.  PDMP not reviewed this encounter. Orders Placed This Encounter  Procedures  . DG INJECT DIAG/THERA/INC NEEDLE/CATH/PLC EPI/LUMB/SAC W/IMG    Standing Status:    Future    Standing Expiration Date:   07/28/2020    Order Specific Question:   Reason for Exam (SYMPTOM  OR DIAGNOSIS REQUIRED)    Answer:   Interlaminar L2-L3    Order Specific Question:   Preferred Imaging Location?    Answer:   [id number]    Order Specific Question:   Radiology Contrast Protocol - do NOT remove file path    Answer:   \\[email or web address]  . Ambulatory referral to Orthopedic Surgery    Referral Priority:   Urgent    Referral Type:   Surgical    Referral Reason:   Specialty Services Required    Requested Specialty:   Orthopedic Surgery    Number of Visits Requested:   1   No orders of the defined types were placed in this encounter.   Follow Up Instructions:    I discussed the assessment and treatment plan with the patient. The patient was provided an opportunity to ask questions and all were answered. The patient agreed with the plan and demonstrated an understanding of the instructions.   The patient was advised to call back or seek an in-person evaluation if the symptoms worsen or if the condition fails to improve as anticipated.  Time: Total encounter time 20 minutes including charting time date of service.     Historical information moved to improve visibility of documentation.  Past Medical History:  Diagnosis Date  . Arthritis of foot   . Carotid artery occlusion   . Colon polyps  adenomatous  . Diverticulosis   . Esophageal stricture   . GERD (gastroesophageal reflux disease)    erosive esophagitis  . Hypercholesterolemia   . Lactose intolerance   . Sarcoidosis 09/20/2007   Qualifier: Diagnosis of  By: Lenn Cal Deborra Medina), Wynona Canes    . Skin cancer   . Stroke [facility]) June 2013   Mini    Past Surgical History:  Procedure Laterality Date  . CAROTID ENDARTERECTOMY Right August 03, 2011   CE  . COLONOSCOPY     [place]eras ago [place], normal per pt  . ENDARTERECTOMY  08/03/2011   Procedure:  ENDARTERECTOMY CAROTID;  Surgeon: Angelia Mould, MD;  Location: [place];  Service: Vascular;  Laterality: Right;  . LUMBAR LAMINECTOMY/DECOMPRESSION MICRODISCECTOMY Bilateral 02/23/2014   Procedure: BILATERAL LUMBAR THREE-FOUR LAMINOTOMY AND MICRODISCECTOMY WITH RIGHT LUMBAR FOUR-FIVE LAMINOTOMY AND MICRODISCECTOMY;  Surgeon: Hosie Spangle, MD;  Location: [facility];  Service: Neurosurgery;  Laterality: Bilateral;  Bilateral L34 laminotomy with Left L34 microdiskectomy and possible Right L34 microdiskectomy and Right L45 Laminotomy and foraminotomy  . MOHS SURGERY     to face  . TONSILLECTOMY     Social History   Tobacco Use  . Smoking status: Never Smoker  . Smokeless tobacco: Never Used  Substance Use Topics  . Alcohol use: Yes    Alcohol/week: 0.0 standard drinks    Comment: 1-2 drinks on weekends   family history includes Lupus in his mother and sister; Stroke in his mother.  Medications: Current Outpatient Medications  Medication Sig Dispense Refill  . atorvastatin (LIPITOR) 20 MG tablet TAKE 1 TABLET BY MOUTH  DAILY AT 6 PM. 90 tablet 3  . gabapentin (NEURONTIN) 300 MG capsule Take 1 capsule (300 mg total) by mouth at bedtime as needed. 90 capsule 1  . ibuprofen (ADVIL,MOTRIN) 200 MG tablet Take 200 mg by mouth every 6 (six) hours as needed.    . metFORMIN (GLUCOPHAGE-XR) 500 MG 24 hr tablet Take 1 tablet (500 mg total) by mouth at bedtime. 90 tablet 1  . omeprazole (PRILOSEC) 40 MG capsule Take 1 capsule (40 mg total) by mouth daily. 90 capsule 3  . predniSONE (STERAPRED UNI-PAK 48 TAB) 10 MG (48) TBPK tablet Take by mouth daily. 12 day dosepack po 48 tablet 0  . sildenafil (VIAGRA) 100 MG tablet Take 0.5-1 tablets (50-100 mg total) by mouth daily as needed for erectile dysfunction. 30 tablet 11   No current facility-administered medications for this visit.   Allergies  Allergen Reactions  . Morphine And Related Nausea And Vomiting    Gi upset  . Other     Bee sting -  swelling in the areas stung         The above documentation has been reviewed and is accurate and complete Lynne Leader                                                  This visit was done remotely.

## 2019-05-30 ENCOUNTER — Encounter: Payer: Self-pay | Admitting: Family Medicine

## 2019-06-04 DIAGNOSIS — M4807 Spinal stenosis, lumbosacral region: Secondary | ICD-10-CM | POA: Diagnosis not present

## 2019-06-05 ENCOUNTER — Ambulatory Visit
Admission: RE | Admit: 2019-06-05 | Discharge: 2019-06-05 | Disposition: A | Discharge status: Home / Self Care | Payer: Medicare Other | Source: Ambulatory Visit | Attending: Family Medicine | Admitting: Family Medicine

## 2019-06-05 ENCOUNTER — Other Ambulatory Visit: Payer: Self-pay

## 2019-06-05 DIAGNOSIS — M5116 Intervertebral disc disorders with radiculopathy, lumbar region: Secondary | ICD-10-CM | POA: Diagnosis not present

## 2019-06-05 DIAGNOSIS — M48061 Spinal stenosis, lumbar region without neurogenic claudication: Secondary | ICD-10-CM | POA: Diagnosis not present

## 2019-06-05 DIAGNOSIS — M5416 Radiculopathy, lumbar region: Secondary | ICD-10-CM

## 2019-06-05 MED ORDER — METHYLPREDNISOLONE ACETATE 40 MG/ML INJ SUSP (RADIOLOG
120.0000 mg | Freq: Once | INTRAMUSCULAR | Status: AC
  Administered 2019-06-05: 120 mg via EPIDURAL

## 2019-06-05 MED ORDER — IOPAMIDOL (ISOVUE-M 200) INJECTION 41%
1.0000 mL | Freq: Once | INTRAMUSCULAR | Status: AC
  Administered 2019-06-05: 14:00:00 1 mL via EPIDURAL

## 2019-06-05 NOTE — Discharge Instructions (Signed)

## 2019-06-11 DIAGNOSIS — H2513 Age-related nuclear cataract, bilateral: Secondary | ICD-10-CM | POA: Diagnosis not present

## 2019-06-11 DIAGNOSIS — H40053 Ocular hypertension, bilateral: Secondary | ICD-10-CM | POA: Diagnosis not present

## 2019-06-11 DIAGNOSIS — H25013 Cortical age-related cataract, bilateral: Secondary | ICD-10-CM | POA: Diagnosis not present

## 2019-06-11 DIAGNOSIS — H524 Presbyopia: Secondary | ICD-10-CM | POA: Diagnosis not present

## 2019-06-14 ENCOUNTER — Ambulatory Visit: Payer: Medicare Other | Admitting: Family Medicine

## 2019-06-27 ENCOUNTER — Telehealth: Payer: Self-pay | Admitting: Family Medicine

## 2019-06-27 NOTE — Telephone Encounter (Signed)
Please advise 

## 2019-06-27 NOTE — Telephone Encounter (Signed)
Pt called - he would like a referral placed for a colonoscopy.

## 2019-06-28 ENCOUNTER — Other Ambulatory Visit: Payer: Self-pay

## 2019-06-28 DIAGNOSIS — Z1211 Encounter for screening for malignant neoplasm of colon: Secondary | ICD-10-CM

## 2019-06-28 NOTE — Telephone Encounter (Signed)
Ok with me. Please place any necessary orders. 

## 2019-06-28 NOTE — Telephone Encounter (Signed)
Referral placed to GI for colonoscopy.

## 2019-07-02 ENCOUNTER — Telehealth: Payer: Self-pay | Admitting: Family Medicine

## 2019-07-02 ENCOUNTER — Encounter: Payer: Self-pay | Admitting: Internal Medicine

## 2019-07-02 NOTE — Telephone Encounter (Signed)
Patient calling, would like to let Dr Jerline Pain Know that he is feeling tired and losing weight. I offered him an appointment, but he wanted to speak with the nurse first.Please Advise.jk

## 2019-07-03 NOTE — Telephone Encounter (Signed)
Spoke with patient and notified him that he will need to discuss these issues with Dr. Jerline Pain for further evaluation. Transferred to schedule appointment.

## 2019-07-10 ENCOUNTER — Other Ambulatory Visit: Payer: Self-pay

## 2019-07-10 ENCOUNTER — Ambulatory Visit (INDEPENDENT_AMBULATORY_CARE_PROVIDER_SITE_OTHER): Payer: Medicare Other | Admitting: Family Medicine

## 2019-07-10 ENCOUNTER — Encounter: Payer: Self-pay | Admitting: Family Medicine

## 2019-07-10 VITALS — BP 112/72 | HR 77 | Temp 98.2°F | Ht 71.5 in | Wt 185.6 lb

## 2019-07-10 DIAGNOSIS — R739 Hyperglycemia, unspecified: Secondary | ICD-10-CM | POA: Diagnosis not present

## 2019-07-10 DIAGNOSIS — N183 Chronic kidney disease, stage 3 unspecified: Secondary | ICD-10-CM | POA: Diagnosis not present

## 2019-07-10 DIAGNOSIS — R5383 Other fatigue: Secondary | ICD-10-CM

## 2019-07-10 LAB — URINALYSIS, ROUTINE W REFLEX MICROSCOPIC
Bilirubin Urine: NEGATIVE
Hgb urine dipstick: NEGATIVE
Ketones, ur: NEGATIVE
Leukocytes,Ua: NEGATIVE
Nitrite: NEGATIVE
RBC / HPF: NONE SEEN (ref 0–?)
Specific Gravity, Urine: 1.025 (ref 1.000–1.030)
Total Protein, Urine: NEGATIVE
Urine Glucose: NEGATIVE
Urobilinogen, UA: 0.2 (ref 0.0–1.0)
pH: 5 (ref 5.0–8.0)

## 2019-07-10 LAB — VITAMIN B12: Vitamin B-12: 287 pg/mL (ref 211–911)

## 2019-07-10 LAB — COMPREHENSIVE METABOLIC PANEL
ALT: 13 U/L (ref 0–53)
AST: 15 U/L (ref 0–37)
Albumin: 3.6 g/dL (ref 3.5–5.2)
Alkaline Phosphatase: 69 U/L (ref 39–117)
BUN: 21 mg/dL (ref 6–23)
CO2: 29 mEq/L (ref 19–32)
Calcium: 9.1 mg/dL (ref 8.4–10.5)
Chloride: 107 mEq/L (ref 96–112)
Creatinine, Ser: 1.29 mg/dL (ref 0.40–1.50)
GFR: 54.28 mL/min — ABNORMAL LOW (ref >60.00)
Glucose, Bld: 101 mg/dL — ABNORMAL HIGH (ref 70–99)
Potassium: 4.5 mEq/L (ref 3.5–5.1)
Sodium: 140 mEq/L (ref 135–145)
Total Bilirubin: 0.6 mg/dL (ref 0.2–1.2)
Total Protein: 5.8 g/dL — ABNORMAL LOW (ref 6.0–8.3)

## 2019-07-10 LAB — CBC
HCT: 42.3 % (ref 39.0–52.0)
Hemoglobin: 14 g/dL (ref 13.0–17.0)
MCHC: 33.2 g/dL (ref 30.0–36.0)
MCV: 92.6 fl (ref 78.0–100.0)
Platelets: 192 10*3/uL (ref 150.0–400.0)
RBC: 4.57 Mil/uL (ref 4.22–5.81)
RDW: 14.2 % (ref 11.5–15.5)
WBC: 6.1 10*3/uL (ref 4.0–10.5)

## 2019-07-10 LAB — TSH: TSH: 1.86 u[IU]/mL (ref 0.35–4.50)

## 2019-07-10 LAB — HEMOGLOBIN A1C: Hgb A1c MFr Bld: 6.5 % (ref 4.6–6.5)

## 2019-07-10 LAB — VITAMIN D 25 HYDROXY (VIT D DEFICIENCY, FRACTURES): VITD: 38.08 ng/mL (ref 30.00–100.00)

## 2019-07-10 NOTE — Addendum Note (Signed)
Addended by: Christiana Fuchs on: 07/10/2019 01:10 PM   Modules accepted: Orders

## 2019-07-10 NOTE — Patient Instructions (Signed)
It was very nice to see you today!  STOP your Metformin.  We will check blood work and a urine sample today.  Please check in with me in a couple weeks to let me know how your symptoms are progressing.  Take care, Dr Jerline Pain  Please try these tips to maintain a healthy lifestyle:   Eat at least 3 REAL meals and 1-2 snacks per day.  Aim for no more than 5 hours between eating.  If you eat breakfast, please do so within one hour of getting up.    Each meal should contain half fruits/vegetables, one quarter protein, and one quarter carbs (no bigger than a computer mouse)   Cut down on sweet beverages. This includes juice, soda, and sweet tea.     Drink at least 1 glass of water with each meal and aim for at least 8 glasses per day   Exercise at least 150 minutes every week.

## 2019-07-10 NOTE — Assessment & Plan Note (Signed)
Check A1c.  Will stop Metformin to see if this helps with his weight loss and fatigue.

## 2019-07-10 NOTE — Addendum Note (Signed)
Addended by: Christiana Fuchs on: 07/10/2019 11:18 AM   Modules accepted: Orders

## 2019-07-10 NOTE — Progress Notes (Signed)
   Eduardo Shaw is a 74 y.o. male who presents today for an office visit.  Assessment/Plan:  New/Acute Problems: Fatigue Unclear etiology.  Possibly related to starting Metformin a few months ago.  Will check labs including CBC, C met, TSH, B12, vitamin D, and urinalysis.  Advised him to stop his Metformin.  He will check in with me in a couple weeks.  He still has persistent fatigue and low appetite would consider CT s if can or referral to GI.  Chronic Problems Addressed Today: Hyperglycemia Check A1c.  Will stop Metformin to see if this helps with his weight loss and fatigue.  CKD (chronic kidney disease) stage 3, GFR 30-59 ml/min (HCC) Check C met.     Subjective:  HPI:  Symptoms started a few months ago after getting an epidural steroid injections. Feeling excessive fatigue for about the past 6 weeks. Has also lost about 10 pounds over the last few months.  No nausea or vomiting. Low appetite. No constipation or diarrhea. Sleeping is at his baseline.        Objective:  Physical Exam: BP 112/72   Pulse 77   Temp 98.2 F (36.8 C) (Temporal)   Ht 5' 11.5" (1.816 m)   Wt 185 lb 9.6 oz (84.2 kg)   SpO2 95%   BMI 25.53 kg/m   Wt Readings from Last 3 Encounters:  07/10/19 185 lb 9.6 oz (84.2 kg)  05/23/19 192 lb 9.6 oz (87.4 kg)  05/17/19 197 lb 12.8 oz (89.7 kg)  Gen: No acute distress, resting comfortably CV: Regular rate and rhythm with no murmurs appreciated Pulm: Normal work of breathing, clear to auscultation bilaterally with no crackles, wheezes, or rhonchi Neuro: Grossly normal, moves all extremities Psych: Normal affect and thought content      Eduardo Shaw M. Jerline Pain, MD 07/10/2019 11:06 AM

## 2019-07-10 NOTE — Assessment & Plan Note (Signed)
Check C met. 

## 2019-07-12 ENCOUNTER — Encounter: Payer: Self-pay | Admitting: Podiatry

## 2019-07-12 ENCOUNTER — Ambulatory Visit: Payer: Medicare Other | Admitting: Podiatry

## 2019-07-12 ENCOUNTER — Other Ambulatory Visit: Payer: Self-pay

## 2019-07-12 VITALS — Temp 96.8°F

## 2019-07-12 DIAGNOSIS — M19072 Primary osteoarthritis, left ankle and foot: Secondary | ICD-10-CM

## 2019-07-12 DIAGNOSIS — M779 Enthesopathy, unspecified: Secondary | ICD-10-CM | POA: Diagnosis not present

## 2019-07-12 NOTE — Progress Notes (Signed)
Please inform patient of the following:  Labs are all NORMAL. His blood sugar is stable. Would like for him to stay off the metformin for a few weeks and let us know if his symptoms improve.  We should recheck his blood sugar in 3-6 months.  Eduardo Shaw. Jerline Pain, MD 07/12/2019 9:23 AM

## 2019-07-16 NOTE — Progress Notes (Signed)
Subjective:   Patient ID: Eduardo Shaw, male   DOB: 74 y.o.   MRN: [id number]   HPI Patient presents stating that the ankle is bothering him but it did improve with the medication and is hoping he can get that periodically.  States that he does not want surgery at the current time neuro   ROS      Objective:  Physical Exam  Vascular status intact with patient sinus tarsi left found to be inflamed with patient having severe subtalar joint ankle arthritis     Assessment:  Subtalar joint inflammatory capsulitis along with  arthritis condition     Plan:  Reviewed condition and went ahead today and I did sterile prep and injected the sinus tarsi 3 mg Kenalog 5 mg Xylocaine advised on reduced activity and reappoint to recheck.  Understands that arthritis will always be with him

## 2019-08-20 ENCOUNTER — Ambulatory Visit (AMBULATORY_SURGERY_CENTER): Payer: Self-pay

## 2019-08-20 ENCOUNTER — Other Ambulatory Visit: Payer: Self-pay

## 2019-08-20 VITALS — Ht 71.5 in | Wt 192.2 lb

## 2019-08-20 DIAGNOSIS — Z86010 Personal history of colonic polyps: Secondary | ICD-10-CM

## 2019-08-20 MED ORDER — SUTAB 1479-225-188 MG PO TABS: 1.0000 | ORAL_TABLET | ORAL | 0 refills | Status: DC

## 2019-08-20 NOTE — Progress Notes (Signed)
No egg or soy allergy known to patient  No issues with past sedation with any surgeries  or procedures, no intubation problems  No diet pills per patient No home 02 use per patient  No blood thinners per patient  Pt denies issues with constipation  No A fib or A flutter   COVID 19 guidelines implemented in [facility] today   COVID vaccine completed on 03/23/2019 per pt; Coupon given to patient at pre visit;  Due to the COVID-19 pandemic we are asking patients to follow these guidelines. Please only bring one care partner. Please be aware that your care partner may wait in the car in the parking lot or if they feel like they will be too hot to wait in the car, they may wait in the lobby on the 4th floor. All care partners are required to wear a mask the entire time (we do not have any that we can provide them), they need to practice social distancing, and we will do a Covid check for all patient's and care partners when you arrive. Also we will check their temperature and your temperature. If the care partner waits in their car they need to stay in the parking lot the entire time and we will call them on their cell phone when the patient is ready for discharge so they can bring the car to the front of the building. Also all patient's will need to wear a mask into building.

## 2019-08-21 ENCOUNTER — Encounter: Payer: Self-pay | Admitting: Internal Medicine

## 2019-08-31 ENCOUNTER — Other Ambulatory Visit: Payer: Self-pay | Admitting: Family Medicine

## 2019-08-31 DIAGNOSIS — R1314 Dysphagia, pharyngoesophageal phase: Secondary | ICD-10-CM

## 2019-08-31 DIAGNOSIS — K222 Esophageal obstruction: Secondary | ICD-10-CM

## 2019-08-31 DIAGNOSIS — K2100 Gastro-esophageal reflux disease with esophagitis, without bleeding: Secondary | ICD-10-CM

## 2019-08-31 DIAGNOSIS — K253 Acute gastric ulcer without hemorrhage or perforation: Secondary | ICD-10-CM

## 2019-09-04 ENCOUNTER — Other Ambulatory Visit: Payer: Self-pay

## 2019-09-04 ENCOUNTER — Ambulatory Visit (AMBULATORY_SURGERY_CENTER): Payer: Medicare Other | Admitting: Internal Medicine

## 2019-09-04 ENCOUNTER — Encounter: Payer: Self-pay | Admitting: Internal Medicine

## 2019-09-04 VITALS — BP 134/59 | HR 82 | Temp 97.3°F | Resp 20 | Ht 71.5 in | Wt 192.2 lb

## 2019-09-04 DIAGNOSIS — Z86010 Personal history of colonic polyps: Secondary | ICD-10-CM | POA: Diagnosis not present

## 2019-09-04 DIAGNOSIS — D124 Benign neoplasm of descending colon: Secondary | ICD-10-CM | POA: Diagnosis not present

## 2019-09-04 MED ORDER — SODIUM CHLORIDE 0.9 % IV SOLN: 500.0000 mL | Freq: Once | INTRAVENOUS | Status: DC

## 2019-09-04 NOTE — Progress Notes (Signed)
Pt's states no medical or surgical changes since previsit or office visit.   V/S-CW  Check-in-JB 

## 2019-09-04 NOTE — Op Note (Signed)
[place] Patient Name: Eduardo Shaw Procedure Date: 09/04/2019 3:07 PM MRN: [id number] Endoscopist: Docia Chuck. Henrene Pastor , MD Age: 74 Referring MD:  Date of Birth: 10/21/1945 Gender: Male Account #: [id number] Procedure:                Colonoscopy with cold snare polypectomy x 1 Indications:              High risk colon cancer surveillance: Personal                            history of adenoma (10 mm or greater in size), High                            risk colon cancer surveillance: Personal history of                            multiple (3 or more) adenomas Medicines:                Monitored Anesthesia Care Procedure:                Pre-Anesthesia Assessment:                           - Prior to the procedure, a History and Physical                            was performed, and patient medications and                            allergies were reviewed. The patient's tolerance of                            previous anesthesia was also reviewed. The risks                            and benefits of the procedure and the sedation                            options and risks were discussed with the patient.                            All questions were answered, and informed consent                            was obtained. Prior Anticoagulants: The patient has                            taken no previous anticoagulant or antiplatelet                            agents. ASA Grade Assessment: II - A patient with                            mild systemic disease. After reviewing the risks  and benefits, the patient was deemed in                            satisfactory condition to undergo the procedure.                           After obtaining informed consent, the colonoscope                            was passed under direct vision. Throughout the                            procedure, the patient's blood pressure, pulse, and                             oxygen saturations were monitored continuously. The                            Colonoscope was introduced through the anus and                            advanced to the the cecum, identified by                            appendiceal orifice and ileocecal valve. The                            ileocecal valve, appendiceal orifice, and rectum                            were photographed. The quality of the bowel                            preparation was excellent. The colonoscopy was                            performed without difficulty. The patient tolerated                            the procedure well. The bowel preparation used was                            SUPREP via split dose instruction. Scope In: 3:14:51 PM Scope Out: 3:28:42 PM Scope Withdrawal Time: 0 hours 9 minutes 45 seconds  Total Procedure Duration: 0 hours 13 minutes 51 seconds  Findings:                 A 5 mm polyp was found in the descending colon. The                            polyp was sessile. The polyp was removed with a                            cold snare. Resection and retrieval were  complete.                           Multiple diverticula were found in the left colon                            and right colon.                           The exam was otherwise without abnormality on                            direct and retroflexion views. Complications:            No immediate complications. Estimated blood loss:                            None. Estimated Blood Loss:     Estimated blood loss: none. Impression:               - One 5 mm polyp in the descending colon, removed                            with a cold snare. Resected and retrieved.                           - Diverticulosis in the left colon and in the right                            colon.                           - The examination was otherwise normal on direct                            and retroflexion views. Recommendation:           -  Repeat colonoscopy in 5 years for surveillance                            (history of multiple and advanced adenomas) to be                            considered based on health status, motivation, and                            current guidelines.                           - Patient has a contact number available for                            emergencies. The signs and symptoms of potential                            delayed complications were discussed with the  patient. Return to normal activities tomorrow.                            Written discharge instructions were provided to the                            patient.                           - Resume previous diet.                           - Continue present medications.                           - Await pathology results. Docia Chuck. Henrene Pastor, MD 09/04/2019 3:36:12 PM This report has been signed electronically.

## 2019-09-04 NOTE — Progress Notes (Signed)
Report to PACU, RN, vss, BBS= Clear.  

## 2019-09-04 NOTE — Progress Notes (Signed)
Called to room to assist during endoscopic procedure.  Patient ID and intended procedure confirmed with present staff. Received instructions for my participation in the procedure from the performing physician.  

## 2019-09-04 NOTE — Patient Instructions (Signed)
Handouts Provided:  Polyps  YOU HAD AN ENDOSCOPIC PROCEDURE TODAY AT THE [facility]:   Refer to the procedure report that was given to you for any specific questions about what was found during the examination.  If the procedure report does not answer your questions, please call your gastroenterologist to clarify.  If you requested that your care partner not be given the details of your procedure findings, then the procedure report has been included in a sealed envelope for you to review at your convenience later.  YOU SHOULD EXPECT: Some feelings of bloating in the abdomen. Passage of more gas than usual.  Walking can help get rid of the air that was put into your GI tract during the procedure and reduce the bloating. If you had a lower endoscopy (such as a colonoscopy or flexible sigmoidoscopy) you may notice spotting of blood in your stool or on the toilet paper. If you underwent a bowel prep for your procedure, you may not have a normal bowel movement for a few days.  Please Note:  You might notice some irritation and congestion in your nose or some drainage.  This is from the oxygen used during your procedure.  There is no need for concern and it should clear up in a day or so.  SYMPTOMS TO REPORT IMMEDIATELY:   Following lower endoscopy (colonoscopy or flexible sigmoidoscopy):  Excessive amounts of blood in the stool  Significant tenderness or worsening of abdominal pains  Swelling of the abdomen that is new, acute  Fever of 100F or higher  For urgent or emergent issues, a gastroenterologist can be reached at any hour by calling [phone]. Do not use MyChart messaging for urgent concerns.    DIET:  We do recommend a small meal at first, but then you may proceed to your regular diet.  Drink plenty of fluids but you should avoid alcoholic beverages for 24 hours.  ACTIVITY:  You should plan to take it easy for the rest of today and you should NOT DRIVE or use heavy  machinery until tomorrow (because of the sedation medicines used during the test).    FOLLOW UP: Our staff will call the number listed on your records 48-72 hours following your procedure to check on you and address any questions or concerns that you may have regarding the information given to you following your procedure. If we do not reach you, we will leave a message.  We will attempt to reach you two times.  During this call, we will ask if you have developed any symptoms of COVID 19. If you develop any symptoms (ie: fever, flu-like symptoms, shortness of breath, cough etc.) before then, please call [phone].  If you test positive for Covid 19 in the 2 weeks post procedure, please call and report this information to us.    If any biopsies were taken you will be contacted by phone or by letter within the next 1-3 weeks.  Please call us at [phone] if you have not heard about the biopsies in 3 weeks.    SIGNATURES/CONFIDENTIALITY: You and/or your care partner have signed paperwork which will be entered into your electronic medical record.  These signatures attest to the fact that that the information above on your After Visit Summary has been reviewed and is understood.  Full responsibility of the confidentiality of this discharge information lies with you and/or your care-partner.  

## 2019-09-04 NOTE — Progress Notes (Signed)
Dr Henrene Pastor notified of ephedrine

## 2019-09-06 ENCOUNTER — Telehealth: Payer: Self-pay

## 2019-09-06 NOTE — Telephone Encounter (Signed)
  Follow up Call-  Call back number 09/04/2019  Post procedure Call Back phone  # [phone]  Permission to leave phone message Yes  Some recent data might be hidden     Patient questions:  Do you have a fever, pain , or abdominal swelling? No. Pain Score  0 *  Have you tolerated food without any problems? Yes.    Have you been able to return to your normal activities? Yes.    Do you have any questions about your discharge instructions: Diet   No. Medications  No. Follow up visit  No.  Do you have questions or concerns about your Care? No.  Actions: * If pain score is 4 or above: No action needed, pain <4.  1. Have you developed a fever since your procedure? no  2.   Have you had an respiratory symptoms (SOB or cough) since your procedure? no  3.   Have you tested positive for COVID 19 since your procedure no  4.   Have you had any family members/close contacts diagnosed with the COVID 19 since your procedure?  no   If yes to any of these questions please route to Joylene John, RN and Erenest Rasher, RN

## 2019-09-06 NOTE — Telephone Encounter (Signed)
Left message on follow up call. 

## 2019-09-11 ENCOUNTER — Encounter: Payer: Self-pay | Admitting: Internal Medicine

## 2019-09-14 DIAGNOSIS — M5416 Radiculopathy, lumbar region: Secondary | ICD-10-CM | POA: Diagnosis not present

## 2019-09-17 ENCOUNTER — Other Ambulatory Visit: Payer: Self-pay | Admitting: Orthopedic Surgery

## 2019-10-02 NOTE — Progress Notes (Signed)
[facility], [address], [address], [address] [place] [address] Phone: [phone] Fax: [phone]      Your procedure is scheduled on September 1  Report to [facility] Main Entrance "A" at 0930 A.M., and check in at the Admitting office.  Call this number if you have problems the morning of surgery:  [phone]  Call [phone] if you have any questions prior to your surgery date Monday-Friday 8am-4pm    Remember:  Do not eat after midnight the night before your surgery  You may drink clear liquids until 0930 am  the morning of your surgery.   Clear liquids allowed are: Water, Non-Citrus Juices (without pulp), Carbonated Beverages, Clear Tea, Black Coffee Only, and Gatorade    Enhanced Recovery after Surgery for Orthopedics Enhanced Recovery after Surgery is a protocol used to improve the stress on your body and your recovery after surgery.  Patient Instructions  . The night before surgery:  o No food after midnight. ONLY clear liquids after midnight  .  Marland Kitchen The day of surgery (if you do NOT have diabetes):  o Drink ONE (1) Pre-Surgery Clear Ensure by __0930 am___ am the morning of surgery   o This drink was given to you during your hospital  pre-op appointment visit. o Nothing else to drink after completing the  Pre-Surgery Clear Ensure.          If you have questions, please contact your surgeon's office.   Take these medicines the morning of surgery with A SIP OF WATER  omeprazole (PRILOSEC)  As of today, STOP taking any Aspirin (unless otherwise instructed by your surgeon) Aleve, Naproxen, Ibuprofen, Motrin, Advil, Goody's, BC's, all herbal medications, fish oil, and all vitamins.                      Do not wear jewelry, make up, or nail polish            Do not wear lotions, powders, perfumes/colognes, or deodorant.            Do not shave 48 hours prior to surgery.  Men may shave face and neck.             Do not bring valuables to the hospital.            [facility] is not responsible for any belongings or valuables.  Do NOT Smoke (Tobacco/Vaping) or drink Alcohol 24 hours prior to your procedure If you use a CPAP at night, you may bring all equipment for your overnight stay.   Contacts, glasses, dentures or bridgework may not be worn into surgery.      For patients admitted to the hospital, discharge time will be determined by your treatment team.   Patients discharged the day of surgery will not be allowed to drive home, and someone needs to stay with them for 24 hours.    Special instructions:   [facility]- Preparing For Surgery  Before surgery, you can play an important role. Because skin is not sterile, your skin needs to be as free of germs as possible. You can reduce the number of germs on your skin by washing with CHG (chlorahexidine gluconate) Soap before surgery.  CHG is an antiseptic cleaner which kills germs and bonds with the skin to continue killing germs even after washing.    Oral Hygiene is also important to reduce your risk of infection.  Remember - BRUSH YOUR TEETH THE MORNING OF SURGERY WITH YOUR REGULAR TOOTHPASTE  Please do not use if you have an allergy to CHG or antibacterial soaps. If your skin becomes reddened/irritated stop using the CHG.  Do not shave (including legs and underarms) for at least 48 hours prior to first CHG shower. It is OK to shave your face.  Please follow these instructions carefully.   1. Shower the NIGHT BEFORE SURGERY and the MORNING OF SURGERY with CHG Soap.   2. If you chose to wash your hair, wash your hair first as usual with your normal shampoo.  3. After you shampoo, rinse your hair and body thoroughly to remove the shampoo.  4. Use CHG as you would any other liquid soap. You can apply CHG directly to the skin and wash gently with a scrungie or a clean washcloth.   5. Apply the CHG Soap to your body ONLY FROM THE NECK  DOWN.  Do not use on open wounds or open sores. Avoid contact with your eyes, ears, mouth and genitals (private parts). Wash Face and genitals (private parts)  with your normal soap.   6. Wash thoroughly, paying special attention to the area where your surgery will be performed.  7. Thoroughly rinse your body with warm water from the neck down.  8. DO NOT shower/wash with your normal soap after using and rinsing off the CHG Soap.  9. Pat yourself dry with a CLEAN TOWEL.  10. Wear CLEAN PAJAMAS to bed the night before surgery  11. Place CLEAN SHEETS on your bed the night of your first shower and DO NOT SLEEP WITH PETS.   Day of Surgery: Wear Clean/Comfortable clothing the morning of surgery Do not apply any deodorants/lotions.   Remember to brush your teeth WITH YOUR REGULAR TOOTHPASTE.   Please read over the following fact sheets that you were given.

## 2019-10-03 ENCOUNTER — Other Ambulatory Visit: Payer: Self-pay

## 2019-10-03 ENCOUNTER — Encounter (HOSPITAL_COMMUNITY)
Admission: RE | Admit: 2019-10-03 | Discharge: 2019-10-03 | Disposition: A | Discharge status: Home / Self Care | Payer: Medicare Other | Source: Ambulatory Visit | Attending: Orthopedic Surgery | Admitting: Orthopedic Surgery

## 2019-10-03 DIAGNOSIS — Z01818 Encounter for other preprocedural examination: Secondary | ICD-10-CM | POA: Diagnosis not present

## 2019-10-03 LAB — COMPREHENSIVE METABOLIC PANEL
ALT: 16 U/L (ref 0–44)
AST: 16 U/L (ref 15–41)
Albumin: 3.9 g/dL (ref 3.5–5.0)
Alkaline Phosphatase: 52 U/L (ref 38–126)
Anion gap: 9 (ref 5–15)
BUN: 17 mg/dL (ref 8–23)
CO2: 25 mmol/L (ref 22–32)
Calcium: 9.3 mg/dL (ref 8.9–10.3)
Chloride: 107 mmol/L (ref 98–111)
Creatinine, Ser: 1.27 mg/dL — ABNORMAL HIGH (ref 0.61–1.24)
GFR calc Af Amer: >60 mL/min (ref >60)
GFR calc non Af Amer: 55 mL/min — ABNORMAL LOW (ref >60)
Glucose, Bld: 97 mg/dL (ref 70–99)
Potassium: 3.9 mmol/L (ref 3.5–5.1)
Sodium: 141 mmol/L (ref 135–145)
Total Bilirubin: 0.7 mg/dL (ref 0.3–1.2)
Total Protein: 6.6 g/dL (ref 6.5–8.1)

## 2019-10-03 LAB — CBC WITH DIFFERENTIAL/PLATELET
Abs Immature Granulocytes: 0.01 10*3/uL (ref 0.00–0.07)
Basophils Absolute: 0 10*3/uL (ref 0.0–0.1)
Basophils Relative: 1 %
Eosinophils Absolute: 0.2 10*3/uL (ref 0.0–0.5)
Eosinophils Relative: 4 %
HCT: 44.4 % (ref 39.0–52.0)
Hemoglobin: 14.2 g/dL (ref 13.0–17.0)
Immature Granulocytes: 0 %
Lymphocytes Relative: 23 %
Lymphs Abs: 1.4 10*3/uL (ref 0.7–4.0)
MCH: 30.6 pg (ref 26.0–34.0)
MCHC: 32 g/dL (ref 30.0–36.0)
MCV: 95.7 fL (ref 80.0–100.0)
Monocytes Absolute: 0.4 10*3/uL (ref 0.1–1.0)
Monocytes Relative: 7 %
Neutro Abs: 4 10*3/uL (ref 1.7–7.7)
Neutrophils Relative %: 65 %
Platelets: 205 10*3/uL (ref 150–400)
RBC: 4.64 MIL/uL (ref 4.22–5.81)
RDW: 12.6 % (ref 11.5–15.5)
WBC: 6.2 10*3/uL (ref 4.0–10.5)
nRBC: 0 % (ref 0.0–0.2)

## 2019-10-03 LAB — URINALYSIS, ROUTINE W REFLEX MICROSCOPIC
Bilirubin Urine: NEGATIVE
Glucose, UA: NEGATIVE mg/dL
Hgb urine dipstick: NEGATIVE
Ketones, ur: NEGATIVE mg/dL
Leukocytes,Ua: NEGATIVE
Nitrite: NEGATIVE
Protein, ur: NEGATIVE mg/dL
Specific Gravity, Urine: 1.014 (ref 1.005–1.030)
pH: 5 (ref 5.0–8.0)

## 2019-10-03 LAB — TYPE AND SCREEN
ABO/RH(D): A POS
Antibody Screen: NEGATIVE

## 2019-10-03 LAB — SURGICAL PCR SCREEN
MRSA, PCR: NEGATIVE
Staphylococcus aureus: NEGATIVE

## 2019-10-03 LAB — APTT: aPTT: 34 seconds (ref 24–36)

## 2019-10-03 LAB — PROTIME-INR
INR: 1 (ref 0.8–1.2)
Prothrombin Time: 13 seconds (ref 11.4–15.2)

## 2019-10-03 NOTE — Progress Notes (Signed)
PCP - Dr. Dimas Chyle with [facility] Cardiologist - Denies  Chest x-ray - Not indicated EKG - 10/03/19 Stress Test - 2013 ECHO - 2013 Cardiac Cath - denies  Sleep Study - No OSA DM - Denies  ERAS Protcol -Yes PRE-SURGERY Ensure given  COVID TEST- 10/08/19  Anesthesia review: No  Patient denies shortness of breath, fever, cough and chest pain at PAT appointment   All instructions explained to the patient, with a verbal understanding of the material. Patient agrees to go over the instructions while at home for a better understanding. Patient also instructed to self quarantine after being tested for COVID-19. The opportunity to ask questions was provided.

## 2019-10-05 DIAGNOSIS — M4807 Spinal stenosis, lumbosacral region: Secondary | ICD-10-CM | POA: Diagnosis not present

## 2019-10-05 DIAGNOSIS — M5416 Radiculopathy, lumbar region: Secondary | ICD-10-CM | POA: Diagnosis not present

## 2019-10-08 ENCOUNTER — Other Ambulatory Visit (HOSPITAL_COMMUNITY)
Admission: RE | Admit: 2019-10-08 | Discharge: 2019-10-08 | Disposition: A | Discharge status: Home / Self Care | Payer: Medicare Other | Source: Ambulatory Visit | Attending: Orthopedic Surgery | Admitting: Orthopedic Surgery

## 2019-10-08 DIAGNOSIS — Z85828 Personal history of other malignant neoplasm of skin: Secondary | ICD-10-CM | POA: Diagnosis not present

## 2019-10-08 DIAGNOSIS — Z01812 Encounter for preprocedural laboratory examination: Secondary | ICD-10-CM | POA: Insufficient documentation

## 2019-10-08 DIAGNOSIS — Z8673 Personal history of transient ischemic attack (TIA), and cerebral infarction without residual deficits: Secondary | ICD-10-CM | POA: Diagnosis not present

## 2019-10-08 DIAGNOSIS — E7800 Pure hypercholesterolemia, unspecified: Secondary | ICD-10-CM | POA: Diagnosis not present

## 2019-10-08 DIAGNOSIS — Z79899 Other long term (current) drug therapy: Secondary | ICD-10-CM | POA: Diagnosis not present

## 2019-10-08 DIAGNOSIS — M62830 Muscle spasm of back: Secondary | ICD-10-CM | POA: Diagnosis not present

## 2019-10-08 DIAGNOSIS — K219 Gastro-esophageal reflux disease without esophagitis: Secondary | ICD-10-CM | POA: Diagnosis not present

## 2019-10-08 DIAGNOSIS — M48061 Spinal stenosis, lumbar region without neurogenic claudication: Secondary | ICD-10-CM | POA: Diagnosis not present

## 2019-10-08 DIAGNOSIS — Z20822 Contact with and (suspected) exposure to covid-19: Secondary | ICD-10-CM | POA: Insufficient documentation

## 2019-10-08 DIAGNOSIS — M5116 Intervertebral disc disorders with radiculopathy, lumbar region: Secondary | ICD-10-CM | POA: Diagnosis not present

## 2019-10-08 DIAGNOSIS — I10 Essential (primary) hypertension: Secondary | ICD-10-CM | POA: Diagnosis not present

## 2019-10-08 LAB — SARS CORONAVIRUS 2 (TAT 6-24 HRS): SARS Coronavirus 2: NEGATIVE

## 2019-10-10 ENCOUNTER — Inpatient Hospital Stay (HOSPITAL_COMMUNITY): Payer: Medicare Other

## 2019-10-10 ENCOUNTER — Encounter (HOSPITAL_COMMUNITY): Payer: Self-pay | Admitting: Orthopedic Surgery

## 2019-10-10 ENCOUNTER — Encounter (HOSPITAL_COMMUNITY)
Admission: RE | Disposition: A | Discharge status: Home / Self Care | Payer: Self-pay | Source: Home / Self Care | Attending: Orthopedic Surgery

## 2019-10-10 ENCOUNTER — Other Ambulatory Visit: Payer: Self-pay

## 2019-10-10 ENCOUNTER — Inpatient Hospital Stay (HOSPITAL_COMMUNITY): Payer: Medicare Other | Admitting: Certified Registered Nurse Anesthetist

## 2019-10-10 ENCOUNTER — Inpatient Hospital Stay (HOSPITAL_COMMUNITY)
Admission: RE | Admit: 2019-10-10 | Discharge: 2019-10-11 | DRG: 455 | Disposition: A | Discharge status: Home / Self Care | Payer: Medicare Other | Attending: Orthopedic Surgery | Admitting: Orthopedic Surgery

## 2019-10-10 DIAGNOSIS — M62830 Muscle spasm of back: Secondary | ICD-10-CM | POA: Diagnosis present

## 2019-10-10 DIAGNOSIS — M5416 Radiculopathy, lumbar region: Secondary | ICD-10-CM | POA: Diagnosis not present

## 2019-10-10 DIAGNOSIS — M47816 Spondylosis without myelopathy or radiculopathy, lumbar region: Secondary | ICD-10-CM | POA: Diagnosis not present

## 2019-10-10 DIAGNOSIS — Z8673 Personal history of transient ischemic attack (TIA), and cerebral infarction without residual deficits: Secondary | ICD-10-CM | POA: Diagnosis not present

## 2019-10-10 DIAGNOSIS — M48061 Spinal stenosis, lumbar region without neurogenic claudication: Secondary | ICD-10-CM | POA: Diagnosis not present

## 2019-10-10 DIAGNOSIS — Z79899 Other long term (current) drug therapy: Secondary | ICD-10-CM | POA: Diagnosis not present

## 2019-10-10 DIAGNOSIS — E785 Hyperlipidemia, unspecified: Secondary | ICD-10-CM | POA: Diagnosis not present

## 2019-10-10 DIAGNOSIS — K219 Gastro-esophageal reflux disease without esophagitis: Secondary | ICD-10-CM | POA: Diagnosis present

## 2019-10-10 DIAGNOSIS — Z419 Encounter for procedure for purposes other than remedying health state, unspecified: Secondary | ICD-10-CM

## 2019-10-10 DIAGNOSIS — E7800 Pure hypercholesterolemia, unspecified: Secondary | ICD-10-CM | POA: Diagnosis present

## 2019-10-10 DIAGNOSIS — M4800 Spinal stenosis, site unspecified: Secondary | ICD-10-CM | POA: Diagnosis present

## 2019-10-10 DIAGNOSIS — M5126 Other intervertebral disc displacement, lumbar region: Secondary | ICD-10-CM | POA: Diagnosis not present

## 2019-10-10 DIAGNOSIS — Z20822 Contact with and (suspected) exposure to covid-19: Secondary | ICD-10-CM | POA: Diagnosis present

## 2019-10-10 DIAGNOSIS — I10 Essential (primary) hypertension: Secondary | ICD-10-CM | POA: Diagnosis present

## 2019-10-10 DIAGNOSIS — Z85828 Personal history of other malignant neoplasm of skin: Secondary | ICD-10-CM | POA: Diagnosis not present

## 2019-10-10 DIAGNOSIS — M545 Low back pain: Secondary | ICD-10-CM | POA: Diagnosis present

## 2019-10-10 DIAGNOSIS — M5116 Intervertebral disc disorders with radiculopathy, lumbar region: Secondary | ICD-10-CM | POA: Diagnosis present

## 2019-10-10 DIAGNOSIS — Z981 Arthrodesis status: Secondary | ICD-10-CM | POA: Diagnosis not present

## 2019-10-10 HISTORY — PX: TRANSFORAMINAL LUMBAR INTERBODY FUSION (TLIF) WITH PEDICLE SCREW FIXATION 1 LEVEL: SHX6141

## 2019-10-10 LAB — ABO/RH: ABO/RH(D): A POS

## 2019-10-10 SURGERY — TRANSFORAMINAL LUMBAR INTERBODY FUSION (TLIF) WITH PEDICLE SCREW FIXATION 1 LEVEL
Anesthesia: General | Laterality: Right

## 2019-10-10 MED ORDER — BUPIVACAINE-EPINEPHRINE 0.25% -1:200000 IJ SOLN
INTRAMUSCULAR | Status: DC | PRN
  Administered 2019-10-10: 20 mL
  Administered 2019-10-10: 8 mL

## 2019-10-10 MED ORDER — MENTHOL 3 MG MT LOZG: 1.0000 | LOZENGE | OROMUCOSAL | Status: DC | PRN

## 2019-10-10 MED ORDER — CEFAZOLIN SODIUM-DEXTROSE 2-4 GM/100ML-% IV SOLN
2.0000 g | INTRAVENOUS | Status: AC
  Administered 2019-10-10: 2 g via INTRAVENOUS
  Filled 2019-10-10: qty 100

## 2019-10-10 MED ORDER — ATORVASTATIN CALCIUM 10 MG PO TABS
20.0000 mg | ORAL_TABLET | Freq: Every evening | ORAL | Status: DC
  Administered 2019-10-10: 20 mg via ORAL
  Filled 2019-10-10: qty 2

## 2019-10-10 MED ORDER — SODIUM CHLORIDE 0.9% FLUSH
3.0000 mL | Freq: Two times a day (BID) | INTRAVENOUS | Status: DC
  Administered 2019-10-10: 3 mL via INTRAVENOUS

## 2019-10-10 MED ORDER — FENTANYL CITRATE (PF) 250 MCG/5ML IJ SOLN
INTRAMUSCULAR | Status: AC
  Filled 2019-10-10: qty 5

## 2019-10-10 MED ORDER — PHENYLEPHRINE HCL-NACL 10-0.9 MG/250ML-% IV SOLN
INTRAVENOUS | Status: DC | PRN
  Administered 2019-10-10: 15 ug/min via INTRAVENOUS

## 2019-10-10 MED ORDER — OXYCODONE HCL 5 MG PO TABS
5.0000 mg | ORAL_TABLET | ORAL | Status: DC | PRN
  Administered 2019-10-10 – 2019-10-11 (×4): 5 mg via ORAL
  Filled 2019-10-10 (×4): qty 1

## 2019-10-10 MED ORDER — ROCURONIUM BROMIDE 10 MG/ML (PF) SYRINGE
PREFILLED_SYRINGE | INTRAVENOUS | Status: AC
  Filled 2019-10-10: qty 10

## 2019-10-10 MED ORDER — PROPOFOL 10 MG/ML IV BOLUS
INTRAVENOUS | Status: AC
  Filled 2019-10-10: qty 20

## 2019-10-10 MED ORDER — ALBUMIN HUMAN 5 % IV SOLN: INTRAVENOUS | Status: DC | PRN

## 2019-10-10 MED ORDER — HYDROMORPHONE HCL 1 MG/ML IJ SOLN
0.2500 mg | INTRAMUSCULAR | Status: DC | PRN
  Administered 2019-10-10 (×2): 0.5 mg via INTRAVENOUS

## 2019-10-10 MED ORDER — METHOCARBAMOL 500 MG PO TABS
500.0000 mg | ORAL_TABLET | Freq: Four times a day (QID) | ORAL | Status: DC | PRN
  Administered 2019-10-10 – 2019-10-11 (×2): 500 mg via ORAL
  Filled 2019-10-10 (×2): qty 1

## 2019-10-10 MED ORDER — BUPIVACAINE LIPOSOME 1.3 % IJ SUSP
20.0000 mL | Freq: Once | INTRAMUSCULAR | Status: DC
  Filled 2019-10-10: qty 20

## 2019-10-10 MED ORDER — SUGAMMADEX SODIUM 200 MG/2ML IV SOLN
INTRAVENOUS | Status: DC | PRN
  Administered 2019-10-10: 200 mg via INTRAVENOUS

## 2019-10-10 MED ORDER — PHENOL 1.4 % MT LIQD: 1.0000 | OROMUCOSAL | Status: DC | PRN

## 2019-10-10 MED ORDER — THROMBIN 20000 UNITS EX SOLR
CUTANEOUS | Status: DC | PRN
  Administered 2019-10-10: 20 mL via TOPICAL

## 2019-10-10 MED ORDER — ONDANSETRON HCL 4 MG/2ML IJ SOLN: 4.0000 mg | Freq: Four times a day (QID) | INTRAMUSCULAR | Status: DC | PRN

## 2019-10-10 MED ORDER — DEXAMETHASONE SODIUM PHOSPHATE 10 MG/ML IJ SOLN
INTRAMUSCULAR | Status: AC
  Filled 2019-10-10: qty 1

## 2019-10-10 MED ORDER — CHLORHEXIDINE GLUCONATE 0.12 % MT SOLN
15.0000 mL | Freq: Once | OROMUCOSAL | Status: AC
  Administered 2019-10-10: 15 mL via OROMUCOSAL
  Filled 2019-10-10: qty 15

## 2019-10-10 MED ORDER — METFORMIN HCL ER 500 MG PO TB24: 500.0000 mg | ORAL_TABLET | Freq: Every day | ORAL | Status: DC

## 2019-10-10 MED ORDER — MEPERIDINE HCL 25 MG/ML IJ SOLN: 6.2500 mg | INTRAMUSCULAR | Status: DC | PRN

## 2019-10-10 MED ORDER — ONDANSETRON HCL 4 MG/2ML IJ SOLN: 4.0000 mg | Freq: Once | INTRAMUSCULAR | Status: DC | PRN

## 2019-10-10 MED ORDER — ZOLPIDEM TARTRATE 5 MG PO TABS: 5.0000 mg | ORAL_TABLET | Freq: Every evening | ORAL | Status: DC | PRN

## 2019-10-10 MED ORDER — POTASSIUM CHLORIDE IN NACL 20-0.9 MEQ/L-% IV SOLN: INTRAVENOUS | Status: DC

## 2019-10-10 MED ORDER — ACETAMINOPHEN 650 MG RE SUPP: 650.0000 mg | RECTAL | Status: DC | PRN

## 2019-10-10 MED ORDER — CEFAZOLIN SODIUM-DEXTROSE 2-4 GM/100ML-% IV SOLN
2.0000 g | Freq: Three times a day (TID) | INTRAVENOUS | Status: AC
  Administered 2019-10-10 – 2019-10-11 (×2): 2 g via INTRAVENOUS
  Filled 2019-10-10 (×2): qty 100

## 2019-10-10 MED ORDER — LIDOCAINE 2% (20 MG/ML) 5 ML SYRINGE
INTRAMUSCULAR | Status: AC
  Filled 2019-10-10: qty 5

## 2019-10-10 MED ORDER — PROPOFOL 1000 MG/100ML IV EMUL
INTRAVENOUS | Status: AC
  Filled 2019-10-10: qty 100

## 2019-10-10 MED ORDER — BUPIVACAINE LIPOSOME 1.3 % IJ SUSP
INTRAMUSCULAR | Status: DC | PRN
  Administered 2019-10-10: 20 mL

## 2019-10-10 MED ORDER — POVIDONE-IODINE 7.5 % EX SOLN
Freq: Once | CUTANEOUS | Status: DC
  Filled 2019-10-10: qty 118

## 2019-10-10 MED ORDER — 0.9 % SODIUM CHLORIDE (POUR BTL) OPTIME
TOPICAL | Status: DC | PRN
  Administered 2019-10-10: 1000 mL

## 2019-10-10 MED ORDER — ORAL CARE MOUTH RINSE: 15.0000 mL | Freq: Once | OROMUCOSAL | Status: AC

## 2019-10-10 MED ORDER — HEMOSTATIC AGENTS (NO CHARGE) OPTIME
TOPICAL | Status: DC | PRN
  Administered 2019-10-10: 1 via TOPICAL

## 2019-10-10 MED ORDER — SENNOSIDES-DOCUSATE SODIUM 8.6-50 MG PO TABS: 1.0000 | ORAL_TABLET | Freq: Every evening | ORAL | Status: DC | PRN

## 2019-10-10 MED ORDER — THROMBIN 20000 UNITS EX SOLR
CUTANEOUS | Status: AC
  Filled 2019-10-10: qty 20000

## 2019-10-10 MED ORDER — LACTATED RINGERS IV SOLN: INTRAVENOUS | Status: DC

## 2019-10-10 MED ORDER — ACETAMINOPHEN 325 MG PO TABS
650.0000 mg | ORAL_TABLET | ORAL | Status: DC | PRN
  Administered 2019-10-10 – 2019-10-11 (×2): 650 mg via ORAL
  Filled 2019-10-10 (×2): qty 2

## 2019-10-10 MED ORDER — ONDANSETRON HCL 4 MG/2ML IJ SOLN
INTRAMUSCULAR | Status: DC | PRN
  Administered 2019-10-10: 4 mg via INTRAVENOUS

## 2019-10-10 MED ORDER — DOCUSATE SODIUM 100 MG PO CAPS
100.0000 mg | ORAL_CAPSULE | Freq: Two times a day (BID) | ORAL | Status: DC
  Administered 2019-10-10: 100 mg via ORAL
  Filled 2019-10-10: qty 1

## 2019-10-10 MED ORDER — METHOCARBAMOL 1000 MG/10ML IJ SOLN
500.0000 mg | Freq: Four times a day (QID) | INTRAVENOUS | Status: DC | PRN
  Filled 2019-10-10: qty 5

## 2019-10-10 MED ORDER — METHYLENE BLUE 0.5 % INJ SOLN
INTRAVENOUS | Status: AC
  Filled 2019-10-10: qty 10

## 2019-10-10 MED ORDER — BISACODYL 5 MG PO TBEC: 5.0000 mg | DELAYED_RELEASE_TABLET | Freq: Every day | ORAL | Status: DC | PRN

## 2019-10-10 MED ORDER — ONDANSETRON HCL 4 MG/2ML IJ SOLN
INTRAMUSCULAR | Status: AC
  Filled 2019-10-10: qty 2

## 2019-10-10 MED ORDER — LACTATED RINGERS IV SOLN: INTRAVENOUS | Status: DC | PRN

## 2019-10-10 MED ORDER — PROPOFOL 500 MG/50ML IV EMUL
INTRAVENOUS | Status: DC | PRN
  Administered 2019-10-10: 50 ug/kg/min via INTRAVENOUS

## 2019-10-10 MED ORDER — HYDROMORPHONE HCL 1 MG/ML IJ SOLN
INTRAMUSCULAR | Status: AC
  Filled 2019-10-10: qty 1

## 2019-10-10 MED ORDER — ROCURONIUM BROMIDE 10 MG/ML (PF) SYRINGE
PREFILLED_SYRINGE | INTRAVENOUS | Status: DC | PRN
  Administered 2019-10-10: 90 mg via INTRAVENOUS

## 2019-10-10 MED ORDER — DEXAMETHASONE SODIUM PHOSPHATE 10 MG/ML IJ SOLN
INTRAMUSCULAR | Status: DC | PRN
  Administered 2019-10-10: 4 mg via INTRAVENOUS

## 2019-10-10 MED ORDER — PANTOPRAZOLE SODIUM 40 MG PO TBEC: 80.0000 mg | DELAYED_RELEASE_TABLET | Freq: Every day | ORAL | Status: DC

## 2019-10-10 MED ORDER — PROPOFOL 10 MG/ML IV BOLUS
INTRAVENOUS | Status: DC | PRN
  Administered 2019-10-10: 130 mg via INTRAVENOUS

## 2019-10-10 MED ORDER — SODIUM CHLORIDE 0.9% FLUSH: 3.0000 mL | INTRAVENOUS | Status: DC | PRN

## 2019-10-10 MED ORDER — FLEET ENEMA 7-19 GM/118ML RE ENEM: 1.0000 | ENEMA | Freq: Once | RECTAL | Status: DC | PRN

## 2019-10-10 MED ORDER — ONDANSETRON HCL 4 MG PO TABS: 4.0000 mg | ORAL_TABLET | Freq: Four times a day (QID) | ORAL | Status: DC | PRN

## 2019-10-10 MED ORDER — LIDOCAINE 20MG/ML (2%) 15 ML SYRINGE OPTIME
INTRAMUSCULAR | Status: DC | PRN
  Administered 2019-10-10: 100 mg via INTRAVENOUS

## 2019-10-10 MED ORDER — SODIUM CHLORIDE 0.9 % IV SOLN: 250.0000 mL | INTRAVENOUS | Status: DC

## 2019-10-10 MED ORDER — EPHEDRINE SULFATE-NACL 50-0.9 MG/10ML-% IV SOSY
PREFILLED_SYRINGE | INTRAVENOUS | Status: DC | PRN
  Administered 2019-10-10 (×2): 10 mg via INTRAVENOUS

## 2019-10-10 MED ORDER — ALUM & MAG HYDROXIDE-SIMETH 200-200-20 MG/5ML PO SUSP: 30.0000 mL | Freq: Four times a day (QID) | ORAL | Status: DC | PRN

## 2019-10-10 MED ORDER — MORPHINE SULFATE (PF) 2 MG/ML IV SOLN: 1.0000 mg | INTRAVENOUS | Status: DC | PRN

## 2019-10-10 MED ORDER — FENTANYL CITRATE (PF) 100 MCG/2ML IJ SOLN
INTRAMUSCULAR | Status: DC | PRN
Start: 2019-10-10 — End: 2019-10-11
  Administered 2019-10-10 (×5): 50 ug via INTRAVENOUS

## 2019-10-10 MED ORDER — ARTIFICIAL TEARS OPHTHALMIC OINT
TOPICAL_OINTMENT | OPHTHALMIC | Status: DC | PRN
  Administered 2019-10-10: 1 via OPHTHALMIC

## 2019-10-10 SURGICAL SUPPLY — 92 items
BENZOIN TINCTURE PRP APPL 2/3 (GAUZE/BANDAGES/DRESSINGS) ×3 IMPLANT
BLADE CLIPPER SURG (BLADE) IMPLANT
BONE VIVIGEN FORMABLE 10CC (Bone Implant) ×3 IMPLANT
BUR PRESCISION 1.7 ELITE (BURR) ×3 IMPLANT
BUR ROUND FLUTED 5 RND (BURR) ×2 IMPLANT
BUR ROUND FLUTED 5MM RND (BURR) ×1
BUR ROUND PRECISION 4.0 (BURR) IMPLANT
BUR ROUND PRECISION 4.0MM (BURR)
BUR SABER RD CUTTING 3.0 (BURR) IMPLANT
BUR SABER RD CUTTING 3.0MM (BURR)
CAGE CONCORDE BULLET 9X11X23 (Cage) ×2 IMPLANT
CAGE SPNL 5D BLT NOSE 23X9X11 (Cage) ×1 IMPLANT
CARTRIDGE OIL MAESTRO DRILL (MISCELLANEOUS) ×1 IMPLANT
CLOSURE WOUND 1/2 X4 (GAUZE/BANDAGES/DRESSINGS) ×1
CNTNR URN SCR LID CUP LEK RST (MISCELLANEOUS) ×1 IMPLANT
CONT SPEC 4OZ STRL OR WHT (MISCELLANEOUS) ×2
COVER BACK TABLE 60X90IN (DRAPES) ×3 IMPLANT
COVER MAYO STAND STRL (DRAPES) ×6 IMPLANT
COVER SURGICAL LIGHT HANDLE (MISCELLANEOUS) IMPLANT
COVER WAND RF STERILE (DRAPES) IMPLANT
DIFFUSER DRILL AIR PNEUMATIC (MISCELLANEOUS) ×3 IMPLANT
DRAIN CHANNEL 15F RND FF W/TCR (WOUND CARE) IMPLANT
DRAPE C-ARM 42X72 X-RAY (DRAPES) ×3 IMPLANT
DRAPE C-ARMOR (DRAPES) IMPLANT
DRAPE POUCH INSTRU U-SHP 10X18 (DRAPES) ×3 IMPLANT
DRAPE SURG 17X23 STRL (DRAPES) ×12 IMPLANT
DURAPREP 26ML APPLICATOR (WOUND CARE) ×3 IMPLANT
ELECT BLADE 4.0 EZ CLEAN MEGAD (MISCELLANEOUS) ×3
ELECT CAUTERY BLADE 6.4 (BLADE) ×3 IMPLANT
ELECT REM PT RETURN 9FT ADLT (ELECTROSURGICAL) ×3
ELECTRODE BLDE 4.0 EZ CLN MEGD (MISCELLANEOUS) ×1 IMPLANT
ELECTRODE REM PT RTRN 9FT ADLT (ELECTROSURGICAL) ×1 IMPLANT
EVACUATOR SILICONE 100CC (DRAIN) IMPLANT
FILTER STRAW FLUID ASPIR (MISCELLANEOUS) ×3 IMPLANT
GAUZE 4X4 16PLY RFD (DISPOSABLE) ×3 IMPLANT
GAUZE SPONGE 4X4 12PLY STRL (GAUZE/BANDAGES/DRESSINGS) ×3 IMPLANT
GAUZE SPONGE 4X4 12PLY STRL LF (GAUZE/BANDAGES/DRESSINGS) ×3 IMPLANT
GLOVE BIO SURGEON STRL SZ7 (GLOVE) ×3 IMPLANT
GLOVE BIO SURGEON STRL SZ8 (GLOVE) ×3 IMPLANT
GLOVE BIOGEL PI IND STRL 7.0 (GLOVE) ×1 IMPLANT
GLOVE BIOGEL PI IND STRL 8 (GLOVE) ×1 IMPLANT
GLOVE BIOGEL PI INDICATOR 7.0 (GLOVE) ×2
GLOVE BIOGEL PI INDICATOR 8 (GLOVE) ×2
GOWN STRL REUS W/ TWL LRG LVL3 (GOWN DISPOSABLE) ×2 IMPLANT
GOWN STRL REUS W/ TWL XL LVL3 (GOWN DISPOSABLE) ×1 IMPLANT
GOWN STRL REUS W/TWL LRG LVL3 (GOWN DISPOSABLE) ×4
GOWN STRL REUS W/TWL XL LVL3 (GOWN DISPOSABLE) ×2
IV CATH 14GX2 1/4 (CATHETERS) ×3 IMPLANT
KIT BASIN OR (CUSTOM PROCEDURE TRAY) ×3 IMPLANT
KIT POSITION SURG JACKSON T1 (MISCELLANEOUS) ×3 IMPLANT
KIT TURNOVER KIT B (KITS) ×3 IMPLANT
MARKER SKIN DUAL TIP RULER LAB (MISCELLANEOUS) ×6 IMPLANT
MODULE EMG NEEDLE SSEP NVM5 (NEEDLE) ×3 IMPLANT
MODULE NVM5 NEXT GEN EMG (NEEDLE) ×3 IMPLANT
NEEDLE 18GX1X1/2 (RX/OR ONLY) (NEEDLE) ×3 IMPLANT
NEEDLE 22X1 1/2 (OR ONLY) (NEEDLE) ×6 IMPLANT
NEEDLE HYPO 25GX1X1/2 BEV (NEEDLE) ×3 IMPLANT
NEEDLE SPNL 18GX3.5 QUINCKE PK (NEEDLE) ×6 IMPLANT
NS IRRIG 1000ML POUR BTL (IV SOLUTION) ×3 IMPLANT
OIL CARTRIDGE MAESTRO DRILL (MISCELLANEOUS) ×3
PACK LAMINECTOMY ORTHO (CUSTOM PROCEDURE TRAY) ×3 IMPLANT
PACK UNIVERSAL I (CUSTOM PROCEDURE TRAY) ×3 IMPLANT
PAD ARMBOARD 7.5X6 YLW CONV (MISCELLANEOUS) ×6 IMPLANT
PATTIES SURGICAL .5 X.5 (GAUZE/BANDAGES/DRESSINGS) ×3 IMPLANT
PATTIES SURGICAL .5 X1 (DISPOSABLE) ×3 IMPLANT
PATTIES SURGICAL .5X1.5 (GAUZE/BANDAGES/DRESSINGS) IMPLANT
PROBE BALL TIP NVM5 SNG USE (BALLOONS) ×3 IMPLANT
ROD PRE LORDOSED 5.5X45 (Rod) ×6 IMPLANT
SCREW SET SINGLE INNER (Screw) ×12 IMPLANT
SCREW VIPER CORT FIX 6.00X30 (Screw) ×12 IMPLANT
SPONGE INTESTINAL PEANUT (DISPOSABLE) ×3 IMPLANT
SPONGE SURGIFOAM ABS GEL 100 (HEMOSTASIS) ×3 IMPLANT
STRIP CLOSURE SKIN 1/2X4 (GAUZE/BANDAGES/DRESSINGS) ×2 IMPLANT
SURGIFLO W/THROMBIN 8M KIT (HEMOSTASIS) ×3 IMPLANT
SUT BONE WAX W31G (SUTURE) ×3 IMPLANT
SUT MNCRL AB 4-0 PS2 18 (SUTURE) ×3 IMPLANT
SUT VIC AB 0 CT1 18XCR BRD 8 (SUTURE) ×1 IMPLANT
SUT VIC AB 0 CT1 8-18 (SUTURE) ×2
SUT VIC AB 1 CT1 18XCR BRD 8 (SUTURE) ×1 IMPLANT
SUT VIC AB 1 CT1 8-18 (SUTURE) ×2
SUT VIC AB 2-0 CT2 18 VCP726D (SUTURE) ×3 IMPLANT
SYR 20ML LL LF (SYRINGE) ×6 IMPLANT
SYR BULB IRRIG 60ML STRL (SYRINGE) ×3 IMPLANT
SYR CONTROL 10ML LL (SYRINGE) ×9 IMPLANT
SYR TB 1ML LUER SLIP (SYRINGE) IMPLANT
TAP EXPEDIUM DL 4.35 (INSTRUMENTS) ×3 IMPLANT
TAP EXPEDIUM DL 5.0 (INSTRUMENTS) ×3 IMPLANT
TAP EXPEDIUM DL 6.0 (INSTRUMENTS) ×3 IMPLANT
TAPE PAPER 3X10 WHT MICROPORE (GAUZE/BANDAGES/DRESSINGS) ×3 IMPLANT
TRAY FOLEY MTR SLVR 16FR STAT (SET/KITS/TRAYS/PACK) ×3 IMPLANT
WATER STERILE IRR 1000ML POUR (IV SOLUTION) ×3 IMPLANT
YANKAUER SUCT BULB TIP NO VENT (SUCTIONS) ×3 IMPLANT

## 2019-10-10 NOTE — Anesthesia Preprocedure Evaluation (Addendum)
Anesthesia Evaluation  Patient identified by MRN, date of birth, ID band Patient awake    Reviewed: Allergy & Precautions, NPO status , Patient's Chart, lab work & pertinent test results  Airway Mallampati: I  TM Distance: >3 FB Neck ROM: Full    Dental  (+) Teeth Intact, Dental Advisory Given   Pulmonary    Pulmonary exam normal        Cardiovascular hypertension, Pt. on medications Normal cardiovascular exam     Neuro/Psych CVA    GI/Hepatic GERD  Medicated and Controlled,  Endo/Other    Renal/GU Renal InsufficiencyRenal disease     Musculoskeletal   Abdominal   Peds  Hematology   Anesthesia Other Findings   Reproductive/Obstetrics                            Anesthesia Physical Anesthesia Plan  ASA: II  Anesthesia Plan: General   Post-op Pain Management:    Induction: Intravenous  PONV Risk Score and Plan: 2 and Ondansetron and Midazolam  Airway Management Planned: Oral ETT  Additional Equipment:   Intra-op Plan:   Post-operative Plan: Extubation in OR  Informed Consent: I have reviewed the patients History and Physical, chart, labs and discussed the procedure including the risks, benefits and alternatives for the proposed anesthesia with the patient or authorized representative who has indicated his/her understanding and acceptance.       Plan Discussed with: CRNA and Surgeon  Anesthesia Plan Comments:         Anesthesia Quick Evaluation

## 2019-10-10 NOTE — Op Note (Signed)
PATIENT NAME: Eduardo Shaw   MEDICAL RECORD NO.:   [id number]   DATE OF BIRTH: 1945/11/29   DATE OF PROCEDURE: 10/10/2019                               OPERATIVE REPORT     PREOPERATIVE DIAGNOSES: 1. Bilateral lumbar radiculopathy. 2. Severe L2-3 spinal stenosis. 3. Large L2-3 disc herniation   POSTOPERATIVE DIAGNOSES: 1. Bilateral lumbar radiculopathy. 2. Severe L2-3 spinal stenosis. 3. Large L2-3 disc herniation   PROCEDURES: 1. L2/3 decompression 2. Left-sided L2-3 transforaminal lumbar interbody fusion. 3. Right-sided L2-3 posterolateral fusion. 4. Insertion of interbody device x1 (54mm Concorde intervertebral spacer). 5. Placement of segmental posterior instrumentation L2, L3 bilaterally  6. Use of local autograft. 7. Use of morselized allograft - DBX-mix 8. Intraoperative use of fluoroscopy.   SURGEON:  Phylliss Bob, MD.   ASSISTANT:  None   ANESTHESIA:  General endotracheal anesthesia.   COMPLICATIONS:  None.   DISPOSITION:  Stable.   ESTIMATED BLOOD LOSS:  100cc   INDICATIONS FOR SURGERY:  Briefly,  Mr. Cervenka is a pleasant 74 year old male who did initially present to me with severe left leg pain.  This did progress to involve his bilateral legs.  He did report weakness and significant difficulty with activities of daily living. I did feel that the symptoms were secondary to the findings noted above.   The patient failed conservative care and did wish to proceed with the procedure  noted above.   OPERATIVE DETAILS:  On 10/10/2019, the patient was brought to surgery and general endotracheal anesthesia was administered.  The patient was placed prone on a well-padded flat Jackson bed with a spinal frame.  Antibiotics were given and a time-out procedure was performed. The back was prepped and draped in the usual fashion.  A midline incision was made overlying the L2-3 intervertebral spaces.  The fascia was incised at the midline.  The paraspinal musculature  was bluntly swept laterally.  Anatomic landmarks for the pedicles were exposed. Using fluoroscopy, I did cannulate the L2 and L3 pedicles bilaterally, using a medial to lateral cortical trajectory technique. At this point, 6 x 30 mm screws were placed into the right pedicles, and a 45 mm rod was placed into the tulip heads of the screw, and caps were also placed.  Distraction was then applied across the L2-3 intervertebral space, and the caps were then provisionally tightened.  On the left side, bone wax was placed into the cannulated pedicle holes.  I then proceeded with the decompressive aspect of the procedure at the L2-3 level.  A partial facetectomy was performed bilaterally at L2-3, decompressing the L2-3 intervertebral space.  I was very pleased with the decompression.  There was noted to be disc material at the very medial aspect of the L3 vertebral body, just inferior to the L2-3 intervertebral disc, as per the MRI.  However, this was noted to be noncompressive, and I did feel that attempts at removing this very central disc material would bring with it risk of nerve injury or durotomy, and that there was no clinical benefit in doing so.  I was ultimately very pleased with the decompression that I was able to accomplish. With an assistant holding medial retraction of the traversing left L3 nerve, I did perform an annulotomy at the posterolateral aspect of the L2-3 intervertebral space.  I then used a series of curettes and pituitary rongeurs to perform  a thorough and complete intervertebral diskectomy.  The intervertebral space was then liberally packed with autograft as well as allograft in the form of hands, also by my review DBX-mix, as was the appropriate-sized intervertebral spacer (11 mm, lordotic).  The spacer was then tamped into position in the usual fashion.  I was very pleased with the press-fit of the spacer.  I then placed 6 mm screws on the left at L2 and L3. A 45-mm rod was then  placed and caps were placed. The distraction was then released on the contralateral side.  All caps were then locked.  The wound was copiously irrigated with a total of approximately 3 L prior to placing the bone graft.  Additional autograft and allograft was then packed into the posterolateral gutter on the right side to help aid in the success of the fusion.  The wound was  explored for any undue bleeding and there was no substantial bleeding encountered.  Gel-Foam was placed over the laminectomy site.  The wound was then closed in layers using #1 Vicryl followed by 2-0 Vicryl, followed by 4-0 Monocryl.  Benzoin and Steri-Strips were applied followed by sterile dressing.    Of note, did use triggered EMG to test the screws on the left, and there was  no screw the tested below 12 mA. There was no sustained abnormal EMG activity noted throughout the surgery.     Phylliss Bob, MD

## 2019-10-10 NOTE — Transfer of Care (Signed)
Immediate Anesthesia Transfer of Care Note  Patient: Eduardo Shaw  Procedure(s) Performed: LEFT-SIDED LUMBAR TWO - LUMBAR THREE TRANSFORAMINAL LUMBAR INTERBODY FUSION WITH INSTRUMENTATION AND ALLOGRAFT (Right )  Patient Location: PACU  Anesthesia Type:General  Level of Consciousness: awake, alert  and oriented  Airway & Oxygen Therapy: Patient Spontanous Breathing  Post-op Assessment: Report given to RN and Post -op Vital signs reviewed and stable  Post vital signs: Reviewed and stable  Last Vitals:  Vitals Value Taken Time  BP 147/69 10/10/19 1641  Temp    Pulse 86 10/10/19 1644  Resp 11 10/10/19 1644  SpO2 93 % 10/10/19 1644  Vitals shown include unvalidated device data.  Last Pain:  Vitals:   10/10/19 0931  TempSrc:   PainSc: 0-No pain         Complications: No complications documented.

## 2019-10-10 NOTE — H&P (Signed)
PREOPERATIVE H&P  Chief Complaint: Low back pain, bilateral leg pain  HPI: Eduardo Shaw is a 74 y.o. male who presents with ongoing pain in the back and bilateral legs  MRI reveals a large HNP at L2/3 with severe stenosis  Patient has failed multiple forms of conservative care and continues to have pain (see office notes for additional details regarding the patient's full course of treatment)  Past Medical History:  Diagnosis Date  . Arthritis of foot   . Carotid artery occlusion   . Cataract   . Colon polyps    adenomatous  . Diverticulosis   . Esophageal stricture   . GERD (gastroesophageal reflux disease)    erosive esophagitis  . Hypercholesterolemia   . Hypertension   . Lactose intolerance   . Sarcoidosis 09/20/2007   Qualifier: Diagnosis of  By: Lenn Cal Deborra Medina), Wynona Canes    . Skin cancer    skin CA-2019  . Stroke [facility]) June 2013   Mini    Past Surgical History:  Procedure Laterality Date  . CAROTID ENDARTERECTOMY Right August 03, 2011   CE  . COLONOSCOPY     [place]eras ago [place], normal per pt  . ENDARTERECTOMY  08/03/2011   Procedure: ENDARTERECTOMY CAROTID;  Surgeon: Angelia Mould, MD;  Location: [place];  Service: Vascular;  Laterality: Right;  . LUMBAR LAMINECTOMY/DECOMPRESSION MICRODISCECTOMY Bilateral 02/23/2014   Procedure: BILATERAL LUMBAR THREE-FOUR LAMINOTOMY AND MICRODISCECTOMY WITH RIGHT LUMBAR FOUR-FIVE LAMINOTOMY AND MICRODISCECTOMY;  Surgeon: Hosie Spangle, MD;  Location: [place];  Service: Neurosurgery;  Laterality: Bilateral;  Bilateral L34 laminotomy with Left L34 microdiskectomy and possible Right L34 microdiskectomy and Right L45 Laminotomy and foraminotomy  . MOHS SURGERY     to face  . TONSILLECTOMY     Social History   Socioeconomic History  . Marital status: Married    Spouse name: Not on file  . Number of children: Not on file  . Years of education: Not on file  . Highest education level: Not on file  Occupational  History  . Not on file  Tobacco Use  . Smoking status: Never Smoker  . Smokeless tobacco: Never Used  Vaping Use  . Vaping Use: Never used  Substance and Sexual Activity  . Alcohol use: Yes    Alcohol/week: 0.0 standard drinks    Comment: 1-2 drinks on weekends    . Drug use: No  . Sexual activity: Not on file  Other Topics Concern  . 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  Finished high school.  Can read and write.  Hasn't seen a doctor in many years.   Social Determinants of Health   Financial Resource Strain:   . Difficulty of Paying Living Expenses: Not on file  Food Insecurity:   . Worried About Charity fundraiser in the Last Year: Not on file  . Ran Out of Food in the Last Year: Not on file  Transportation Needs:   . Lack of Transportation (Medical): Not on file  . Lack of Transportation (Non-Medical): Not on file  Physical Activity:   . Days of Exercise per Week: Not on file  . Minutes of Exercise per Session: Not on file  Stress:   . Feeling of Stress : Not on file  Social Connections:   . Frequency of  Communication with Friends and Family: Not on file  . Frequency of Social Gatherings with Friends and Family: Not on file  . Attends Religious Services: Not on file  . Active Member of Clubs or Organizations: Not on file  . Attends Archivist Meetings: Not on file  . Marital Status: Not on file   Family History  Problem Relation Age of Onset  . Lupus Mother   . Stroke Mother   . Diabetes Sister   . Lupus Sister   . Colon cancer Neg Hx   . Colon polyps Neg Hx   . Rectal cancer Neg Hx   . Stomach cancer Neg Hx   . Esophageal cancer Neg Hx   . Pancreatic cancer Neg Hx   . Liver disease Neg Hx   . Kidney disease Neg Hx    Allergies  Allergen Reactions  . Morphine And Related Nausea And Vomiting    Gi upset  . Other     Bee  sting - swelling in the areas stung   Prior to Admission medications   Medication Sig Start Date End Date Taking? Authorizing Provider  atorvastatin (LIPITOR) 20 MG tablet TAKE 1 TABLET BY MOUTH  DAILY AT 6 PM. Patient taking differently: Take 20 mg by mouth every evening.  04/23/19  Yes Vivi Barrack, MD  ibuprofen (ADVIL,MOTRIN) 200 MG tablet Take 400 mg by mouth 2 (two) times daily as needed (pain.).    Yes [provider]  omeprazole (PRILOSEC) 40 MG capsule TAKE 1 CAPSULE BY MOUTH  DAILY Patient taking differently: Take 40 mg by mouth daily.  08/31/19  Yes Vivi Barrack, MD  metFORMIN (GLUCOPHAGE-XR) 500 MG 24 hr tablet TAKE 1 TABLET BY MOUTH AT  BEDTIME Patient not taking: Reported on 09/04/2019 08/31/19   Vivi Barrack, MD     All other systems have been reviewed and were otherwise negative with the exception of those mentioned in the HPI and as above.  Physical Exam: There were no vitals filed for this visit.  There is no height or weight on file to calculate BMI.  General: Alert, no acute distress Cardiovascular: No pedal edema Respiratory: No cyanosis, no use of accessory musculature Skin: No lesions in the area of chief complaint Neurologic: Sensation intact distally Psychiatric: Patient is competent for consent with normal mood and affect Lymphatic: No axillary or cervical lymphadenopathy   Assessment/Plan: LUMBAR RADICULOPATHY, SECONDARY TO SEVERE BILATERAL SPINAL STENOSIS AT LUMBAR 2-LUMBAR 3 , IN ADDITION TO A LARGE [place] for Procedure(s): LEFT-SIDED LUMBAR 2 - LUMBAR 3 TRANSFORAMINAL LUMBAR INTERBODY FUSION WITH INSTRUMENTATION AND ALLOGRAFT   Norva Karvonen, MD 10/10/2019 7:36 AM

## 2019-10-10 NOTE — Anesthesia Procedure Notes (Signed)
Procedure Name: Intubation Date/Time: 10/10/2019 12:41 PM Performed by: Lowella Dell, CRNA Pre-anesthesia Checklist: Patient identified, Emergency Drugs available, Suction available and Patient being monitored Patient Re-evaluated:Patient Re-evaluated prior to induction Oxygen Delivery Method: Circle System Utilized Preoxygenation: Pre-oxygenation with 100% oxygen Induction Type: IV induction Ventilation: Mask ventilation without difficulty Laryngoscope Size: Mac and 4 Grade View: Grade I Tube type: Oral Tube size: 7.0 mm Number of attempts: 1 Airway Equipment and Method: Stylet Placement Confirmation: ETT inserted through vocal cords under direct vision,  positive ETCO2 and breath sounds checked- equal and bilateral Secured at: 21 cm Tube secured with: Tape Dental Injury: Teeth and Oropharynx as per pre-operative assessment

## 2019-10-11 ENCOUNTER — Encounter (HOSPITAL_COMMUNITY): Payer: Self-pay | Admitting: Orthopedic Surgery

## 2019-10-11 MED ORDER — OXYCODONE-ACETAMINOPHEN 5-325 MG PO TABS: 1.0000 | ORAL_TABLET | ORAL | 0 refills | Status: DC | PRN

## 2019-10-11 MED ORDER — METHOCARBAMOL 500 MG PO TABS: 500.0000 mg | ORAL_TABLET | Freq: Four times a day (QID) | ORAL | 1 refills | Status: DC | PRN

## 2019-10-11 NOTE — Anesthesia Postprocedure Evaluation (Signed)
Anesthesia Post Note  Patient: Eduardo Shaw  Procedure(s) Performed: LEFT-SIDED LUMBAR TWO - LUMBAR THREE TRANSFORAMINAL LUMBAR INTERBODY FUSION WITH INSTRUMENTATION AND ALLOGRAFT (Right )     Patient location during evaluation: PACU Anesthesia Type: General Level of consciousness: awake and alert Pain management: pain level controlled Vital Signs Assessment: post-procedure vital signs reviewed and stable Respiratory status: spontaneous breathing, nonlabored ventilation, respiratory function stable and patient connected to nasal cannula oxygen Cardiovascular status: blood pressure returned to baseline and stable Postop Assessment: no apparent nausea or vomiting Anesthetic complications: no   No complications documented.  Last Vitals:  Vitals:   10/11/19 0352 10/11/19 0724  BP: 122/61 (!) 140/51  Pulse: 69 71  Resp: 20 (!) 8  Temp: 36.6 C 36.7 C  SpO2: 94% 94%    Last Pain:  Vitals:   10/11/19 0724  TempSrc: Oral  PainSc:                  Lonni Dirden DAVID

## 2019-10-11 NOTE — Evaluation (Signed)
Occupational Therapy Evaluation Patient Details Name: BRITAIN SABER MRN: [id number] DOB: 04-08-1945 Today's Date: 10/11/2019    History of Present Illness The pt is a 74 yo male presenting s/p decompression and TLIF of L2-3 on 9/1 due to ongoing pain in the back and bilateral legs. PMH includes: HTN and prior surgery to L4-5.   Clinical Impression   PTA pt living with wife and functioning at independent community level. At time of eval, pt presents with ability to complete bed mobilityand sit <> stands at mod I level with cueing for log rolling technique. Pt is currently completing BADL at supervision level overall and has been educated on as needed AE for improved independence in home environment. Back handout provided and reviewed adls in detail. Pt educated on: clothing between brace, never sleep in brace, set an alarm at night for medication, avoid sitting for long periods of time, correct bed positioning for sleeping, correct sequence for bed mobility, avoiding lifting more than 5 pounds and never wash directly over incision. All education is complete and patient indicates understanding. Wife also present for above education and supportive. No further OT needs identified, OT Will sign off. Thank you for this consult.     Follow Up Recommendations  No OT follow up;Supervision - Intermittent    Equipment Recommendations  None recommended by OT    Recommendations for Other Services       Precautions / Restrictions Precautions Precautions: Fall;Back Precaution Booklet Issued: Yes (comment) Precaution Comments: reviewed in context of BADL Required Braces or Orthoses: Spinal Brace Spinal Brace: Thoracolumbosacral orthotic Restrictions Weight Bearing Restrictions: No      Mobility Bed Mobility Overal bed mobility: Modified Independent             General bed mobility comments: increased time, cues for log rolling  Transfers Overall transfer level: Modified  independent Equipment used: None                  Balance Overall balance assessment: Mild deficits observed, not formally tested                                         ADL either performed or assessed with clinical judgement   ADL                                         General ADL Comments: Pt able to complete all BADL at supervision level. He doe have some difficulty sustaining figure four method but has been educated on alternative methods with laying on back or using long handled reacher. Pt completed toilet transfer with supervision level. Reviewed need for toilet aide and long handled sponge and shoe horn. Pt able to complete functional mobility without AD at mod I level. Wife present for session and education     Vision Baseline Vision/History: No visual deficits       Perception     Praxis      Pertinent Vitals/Pain Pain Assessment: No/denies pain     Hand Dominance     Extremity/Trunk Assessment Upper Extremity Assessment Upper Extremity Assessment: Overall WFL for tasks assessed   Lower Extremity Assessment Lower Extremity Assessment: Defer to PT evaluation       Communication Communication Communication: No difficulties   Cognition Arousal/Alertness: Awake/alert Behavior During  Therapy: WFL for tasks assessed/performed Overall Cognitive Status: Within Functional Limits for tasks assessed                                     General Comments       Exercises     Shoulder Instructions      Home Living Family/patient expects to be discharged to:: Private residence Living Arrangements: Spouse/significant other Available Help at Discharge: Family;Available PRN/intermittently (wife works until 1P M-F) Type of Home: House Home Access: Stairs to enter [employer] of Steps: 6   Home Layout: Two level;Able to live on main level with bedroom/bathroom Alternate Level Stairs-Number of  Steps: full flight to upstairs, but does not have to go up there   Bathroom Shower/Tub: Occupational psychologist: Standard     Home Equipment: None          Prior Functioning/Environment Level of Independence: Independent        Comments: was having ambulation difficulty very close to sx date, but otherwise independent        OT Problem List: Decreased knowledge of precautions;Decreased activity tolerance;Decreased knowledge of use of DME or AE;Impaired balance (sitting and/or standing)      OT Treatment/Interventions:      OT Goals(Current goals can be found in the care plan section) Acute Rehab OT Goals Patient Stated Goal: return to independence OT Goal Formulation: All assessment and education complete, DC therapy  OT Frequency:     Barriers to D/C:            Co-evaluation              AM-PAC OT "6 Clicks" Daily Activity     Outcome Measure Help from another person eating meals?: None Help from another person taking care of personal grooming?: None Help from another person toileting, which includes using toliet, bedpan, or urinal?: None Help from another person bathing (including washing, rinsing, drying)?: None Help from another person to put on and taking off regular upper body clothing?: None Help from another person to put on and taking off regular lower body clothing?: A Little 6 Click Score: 23   End of Session Equipment Utilized During Treatment: Gait belt Nurse Communication: Mobility status  Activity Tolerance: Patient tolerated treatment well Patient left: in bed;with call bell/phone within reach  OT Visit Diagnosis: Unsteadiness on feet (R26.81);Other abnormalities of gait and mobility (R26.89)                Time: 2482-5003 OT Time Calculation (min): 29 min Charges:  OT General Charges $OT Visit: 1 Visit OT Evaluation $OT Eval Low Complexity: 1 Low OT Treatments $Self Care/Home Management : 8-22 mins  Zenovia Jarred,  MSOT, OTR/L Acute Rehabilitation Services [facility] [place] Office Number: [phone] Pager: [phone]  Zenovia Jarred 10/11/2019, 9:10 AM

## 2019-10-11 NOTE — Progress Notes (Signed)
Patient alert and oriented, mae's well, voiding adequate amount of urine, swallowing without difficulty, no c/o pain at time of discharge. Patient discharged home with family. Script and discharged instructions given to patient. Patient and family stated understanding of instructions given. Patient has an appointment with Dr. Dumonski 

## 2019-10-11 NOTE — Evaluation (Signed)
Physical Therapy Evaluation Patient Details Name: Eduardo Shaw MRN: [id number] DOB: [birth year] Today's Date: 10/11/2019   History of Present Illness  The pt is a 74 yo male presenting s/p decompression and TLIF of L2-3 on 9/1 due to ongoing pain in the back and bilateral legs. PMH includes: HTN and prior surgery to L4-5.    Clinical Impression  Pt in bed upon arrival of PT, agreeable to evaluation at this time. Prior to admission the pt was independent with mobility and ADLs, but limited by pain. The pt now presents with minor limitations in functional mobility, strength, and dynamic stability due to above dx, and will continue to benefit from skilled PT to address these deficits. The pt was able to complete all transfers and hallway ambulation without use of AD and with minor evidence of instability. He and his wife were also educated on stair navigation and have no questions at this time. The pt will be safe to return home with assist of his wife and supervision with mobility initially for safety, but may benefit from continued OPPT when cleared by MD to facilitate return to complete independence and activity.      Follow Up Recommendations Follow surgeon's recommendation for DC plan and follow-up therapies (pt may benefit from OPPT when cleared by MD)    Equipment Recommendations  None recommended by PT    Recommendations for Other Services       Precautions / Restrictions Precautions Precautions: Fall;Back Precaution Booklet Issued: Yes (comment) Precaution Comments: pt able to recall 3/3, given handout Required Braces or Orthoses: Spinal Brace Spinal Brace: Thoracolumbosacral orthotic Restrictions Weight Bearing Restrictions: No      Mobility  Bed Mobility Overal bed mobility: Modified Independent             General bed mobility comments: pt sitting in recliner at start of session  Transfers Overall transfer level: Modified independent Equipment used: None                 Ambulation/Gait Ambulation/Gait assistance: Modified independent (Device/Increase time) Gait Distance (Feet): 300 Feet Assistive device: None Gait Pattern/deviations: Step-through pattern;Decreased stride length;Wide base of support   Gait velocity interpretation: <1.31 ft/sec, indicative of household ambulator General Gait Details: pt with slightly shortened stride with wider BOS, slow  but steady.  Stairs Stairs: Yes Stairs assistance: Supervision Stair Management: One rail Right;Alternating pattern;Step to pattern;Forwards Number of Stairs: 6 General stair comments: the pt was able to complete 4 single step-ups, followed by 2 x 3 steps. all with use of R rail. The pt prefers leading with RLE at this time, but was able to complete with alternating pattern.      Balance Overall balance assessment: Mild deficits observed, not formally tested                                           Pertinent Vitals/Pain Pain Assessment: No/denies pain    Home Living Family/patient expects to be discharged to:: Private residence Living Arrangements: Spouse/significant other Available Help at Discharge: Family;Available PRN/intermittently (wife works until 1PM mon-friday) Type of Home: House Home Access: Stairs to enter Entrance Stairs-Rails: Chemical engineer of Steps: 3 (in garage), 5 (at front) Home Layout: Two level;Able to live on main level with bedroom/bathroom Home Equipment: None      Prior Function Level of Independence: Independent  Comments: was having ambulation difficulty very close to sx date, but otherwise independent     Hand Dominance        Extremity/Trunk Assessment   Upper Extremity Assessment Upper Extremity Assessment: Defer to OT evaluation    Lower Extremity Assessment Lower Extremity Assessment: Overall WFL for tasks assessed    Cervical / Trunk Assessment Cervical / Trunk Assessment:  Normal (s/p spine surgery)  Communication   Communication: No difficulties  Cognition Arousal/Alertness: Awake/alert Behavior During Therapy: WFL for tasks assessed/performed Overall Cognitive Status: Within Functional Limits for tasks assessed                                        General Comments General comments (skin integrity, edema, etc.): all questions about brace answered. pt and his wife have no other questions at this time.    Exercises     Assessment/Plan    PT Assessment Patient needs continued PT services  PT Problem List Decreased balance;Decreased activity tolerance       PT Treatment Interventions DME instruction;Therapeutic exercise;Gait training;Stair training;Balance training;Functional mobility training;Therapeutic activities;Patient/family education    PT Goals (Current goals can be found in the Care Plan section)  Acute Rehab PT Goals Patient Stated Goal: return to independence PT Goal Formulation: With patient Time For Goal Achievement: 10/25/19 Potential to Achieve Goals: Good    Frequency Min 5X/week   Barriers to discharge        M-PAC PT "6 Clicks" Mobility  Outcome Measure Help needed turning from your back to your side while in a flat bed without using bedrails?: None Help needed moving from lying on your back to sitting on the side of a flat bed without using bedrails?: None Help needed moving to and from a bed to a chair (including a wheelchair)?: None Help needed standing up from a chair using your arms (e.g., wheelchair or bedside chair)?: None Help needed to walk in hospital room?: A Little Help needed climbing 3-5 steps with a railing? : A Little 6 Click Score: 22    End of Session Equipment Utilized During Treatment: Gait belt;Back brace Activity Tolerance: Patient tolerated treatment well;No increased pain Patient left: with family/visitor present (standing in room with wife) Nurse Communication: Mobility  status PT Visit Diagnosis: Other abnormalities of gait and mobility (R26.89)    Time: 4401-0272 PT Time Calculation (min) (ACUTE ONLY): 16 min   Charges:   PT Evaluation $PT Eval Low Complexity: 1 Low          Karma Ganja, PT, DPT   Acute Rehabilitation Department Pager #: [phone]  Otho Bellows 10/11/2019, 10:30 AM

## 2019-10-11 NOTE — Progress Notes (Signed)
    Patient doing well  Denies leg pain + LBP   Physical Exam: Vitals:   10/10/19 2310 10/11/19 0352  BP: 139/68 122/61  Pulse: 85 69  Resp: 18 20  Temp: 97.7 F (36.5 C) 97.9 F (36.6 C)  SpO2: 96% 94%    Dressing in place NVI  POD #1 s/p L2/3 decompression and fusion, doing well  - up with PT/OT, encourage ambulation - Percocet for pain, Robaxin for muscle spasms - likely d/c home today with f/u in 2 weeks

## 2019-10-17 NOTE — Discharge Summary (Signed)
Patient ID: Eduardo Shaw MRN: [id number] DOB/AGE: 75-Mar-1947 75 y.o.  Admit date: 10/10/2019 Discharge date: 10/11/2019  Admission Diagnoses:  Active Problems:   Spinal stenosis   Discharge Diagnoses:  Same  Past Medical History:  Diagnosis Date  . Arthritis of foot   . Carotid artery occlusion   . Cataract   . Colon polyps    adenomatous  . Diverticulosis   . Esophageal stricture   . GERD (gastroesophageal reflux disease)    erosive esophagitis  . Hypercholesterolemia   . Hypertension   . Lactose intolerance   . Sarcoidosis 09/20/2007   Qualifier: Diagnosis of  By: Lenn Cal Deborra Medina), Wynona Canes    . Skin cancer    skin CA-2019  . Stroke [facility]) June 2013   Mini     Surgeries: Procedure(s): LEFT-SIDED LUMBAR TWO - LUMBAR THREE TRANSFORAMINAL LUMBAR INTERBODY FUSION WITH INSTRUMENTATION AND ALLOGRAFT on 10/10/2019   Consultants: None  Discharged Condition: Improved  Hospital Course: Eduardo Shaw is an 74 y.o. male who was admitted 10/10/2019 for operative treatment of spinal stenosis. Patient has severe unremitting pain that affects sleep, daily activities, and work/hobbies. After pre-op clearance the patient was taken to the operating room on 10/10/2019 and underwent  Procedure(s): LEFT-SIDED LUMBAR TWO - LUMBAR THREE TRANSFORAMINAL LUMBAR INTERBODY FUSION WITH INSTRUMENTATION AND ALLOGRAFT.    Patient was given perioperative antibiotics:  Anti-infectives (From admission, onward)   Start     Dose/Rate Route Frequency Ordered Stop   10/10/19 2100  ceFAZolin (ANCEF) IVPB 2g/100 mL premix        2 g 200 mL/hr over 30 Minutes Intravenous Every 8 hours 10/10/19 1833 10/11/19 0429   10/10/19 0930  ceFAZolin (ANCEF) IVPB 2g/100 mL premix        2 g 200 mL/hr over 30 Minutes Intravenous On call to O.R. 10/10/19 0920 10/10/19 1247       Patient was given sequential compression devices, early ambulation to prevent DVT.  Patient benefited maximally from hospital stay and  there were no complications.    Recent vital signs: BP (!) 140/51 (BP Location: Right Arm)   Pulse 71   Temp 98.1 F (36.7 C) (Oral)   Resp (!) 8   Ht 6\' 1"  (1.854 m)   Wt 86.2 kg   SpO2 94%   BMI 25.07 kg/m    Discharge Medications:   Allergies as of 10/11/2019      Reactions   Morphine And Related Nausea And Vomiting   Gi upset   Other    Bee sting - swelling in the areas stung      Medication List    TAKE these medications   atorvastatin 20 MG tablet Commonly known as: LIPITOR TAKE 1 TABLET BY MOUTH  DAILY AT 6 PM. What changed: See the new instructions.   metFORMIN 500 MG 24 hr tablet Commonly known as: GLUCOPHAGE-XR TAKE 1 TABLET BY MOUTH AT  BEDTIME   methocarbamol 500 MG tablet Commonly known as: ROBAXIN Take 1 tablet (500 mg total) by mouth every 6 (six) hours as needed for muscle spasms.   omeprazole 40 MG capsule Commonly known as: PRILOSEC TAKE 1 CAPSULE BY MOUTH  DAILY   oxyCODONE-acetaminophen 5-325 MG tablet Commonly known as: Percocet Take 1 tablet by mouth every 4 (four) hours as needed for severe pain.       Diagnostic Studies: DG Lumbar Spine 2-3 Views  Result Date: 10/10/2019 CLINICAL DATA:  Provided history: Surgery, elective. Right-sided lumbar 2-lumbar 3 transforaminal  lumbar interbody fusion with instrumentation and allograft. Reported fluoroscopy time 58 seconds, 37.97 mGy. EXAM: LUMBAR SPINE - 2-3 VIEW; DG C-ARM 1-60 MIN COMPARISON:  Radiograph of the lumbar spine performed earlier the same day 10/10/2019, MRI of the lumbar spine 05/26/2019 FINDINGS: PA and lateral view intraoperative fluoroscopic images of the lumbar spine are submitted, 2 images total. The images demonstrate bilateral pedicle screws and vertical interconnecting rods, as well as an interbody spacer, at what is presumably the L2-L3 level. No unexpected finding. IMPRESSION: Two intraoperative fluoroscopic images of the lumbar spine as described. Electronically Signed   By: Kellie Simmering DO   On: 10/10/2019 16:38   DG Lumbar Spine 1 View  Result Date: 10/10/2019 CLINICAL DATA:  Needle localization EXAM: LUMBAR SPINE - 1 VIEW COMPARISON:  None. FINDINGS: Lateral radiograph demonstrates localizers overlying L1 and L2 spinous processes. Multilevel degenerative changes with disc height loss, endplate osteophytes, and facet hypertrophy. IMPRESSION: Intraoperative localization Electronically Signed   By: Macy Mis M.D.   On: 10/10/2019 15:00   DG C-Arm 1-60 Min  Result Date: 10/10/2019 CLINICAL DATA:  Provided history: Surgery, elective. Right-sided lumbar 2-lumbar 3 transforaminal lumbar interbody fusion with instrumentation and allograft. Reported fluoroscopy time 58 seconds, 37.97 mGy. EXAM: LUMBAR SPINE - 2-3 VIEW; DG C-ARM 1-60 MIN COMPARISON:  Radiograph of the lumbar spine performed earlier the same day 10/10/2019, MRI of the lumbar spine 05/26/2019 FINDINGS: PA and lateral view intraoperative fluoroscopic images of the lumbar spine are submitted, 2 images total. The images demonstrate bilateral pedicle screws and vertical interconnecting rods, as well as an interbody spacer, at what is presumably the L2-L3 level. No unexpected finding. IMPRESSION: Two intraoperative fluoroscopic images of the lumbar spine as described. Electronically Signed   By: Kellie Simmering DO   On: 10/10/2019 16:38    Disposition: Discharge disposition: 01-Home or Self Care        POD #1 s/p L2/3 decompression and fusion, doing well  - up with PT/OT, encourage ambulation - Percocet for pain, Robaxin for muscle spasms -Scripts for pain sent to pharmacy electronically  -D/C instructions sheet printed and in chart -D/C today  -F/U in office 2 weeks   Signed: Lennie Muckle Jezebelle Ledwell 10/17/2019, 12:45 PM

## 2019-11-19 DIAGNOSIS — Z9889 Other specified postprocedural states: Secondary | ICD-10-CM | POA: Diagnosis not present

## 2019-12-13 DIAGNOSIS — H40053 Ocular hypertension, bilateral: Secondary | ICD-10-CM | POA: Diagnosis not present

## 2019-12-31 DIAGNOSIS — Z9889 Other specified postprocedural states: Secondary | ICD-10-CM | POA: Diagnosis not present

## 2020-02-07 DIAGNOSIS — Z1159 Encounter for screening for other viral diseases: Secondary | ICD-10-CM | POA: Diagnosis not present

## 2020-02-14 ENCOUNTER — Telehealth: Payer: Self-pay | Admitting: *Deleted

## 2020-02-14 ENCOUNTER — Telehealth: Payer: Self-pay

## 2020-02-14 DIAGNOSIS — M19072 Primary osteoarthritis, left ankle and foot: Secondary | ICD-10-CM

## 2020-02-14 NOTE — Telephone Encounter (Signed)
Patient is wanting a referral to [facility], for the arthritis in L foot.

## 2020-02-14 NOTE — Telephone Encounter (Signed)
Patient would like a phone call once it is placed.

## 2020-02-14 NOTE — Telephone Encounter (Signed)
Patient has not been seen for this problem, Last OV on 07/2019 He may need OV with PCP

## 2020-02-14 NOTE — Telephone Encounter (Signed)
Fax number is [phone]

## 2020-02-14 NOTE — Telephone Encounter (Signed)
Patient called again asking if we can get this taken care of ASAP. Maybe sam can take care of this

## 2020-02-14 NOTE — Telephone Encounter (Signed)
Patient Requesting referral to [facility], for the arthritis in L foot. Please advise   Fax number is [phone] Called patient once referral is placed

## 2020-02-14 NOTE — Telephone Encounter (Signed)
..  Patient has changed insurance and states he has talked about the issue in the past with Dr.Pakrer.

## 2020-02-14 NOTE — Telephone Encounter (Signed)
Awaiting for Dr Jimmey Ralph ok to place referral

## 2020-02-15 NOTE — Telephone Encounter (Signed)
Ok to place referral.

## 2020-02-18 ENCOUNTER — Ambulatory Visit: Payer: Medicare HMO

## 2020-02-18 ENCOUNTER — Encounter: Payer: Self-pay | Admitting: Podiatry

## 2020-02-18 ENCOUNTER — Other Ambulatory Visit: Payer: Self-pay

## 2020-02-18 ENCOUNTER — Ambulatory Visit (INDEPENDENT_AMBULATORY_CARE_PROVIDER_SITE_OTHER): Payer: Medicare HMO | Admitting: Podiatry

## 2020-02-18 ENCOUNTER — Ambulatory Visit: Payer: Medicare Other | Admitting: Family Medicine

## 2020-02-18 DIAGNOSIS — M779 Enthesopathy, unspecified: Secondary | ICD-10-CM

## 2020-02-18 DIAGNOSIS — M778 Other enthesopathies, not elsewhere classified: Secondary | ICD-10-CM

## 2020-02-18 MED ORDER — TRIAMCINOLONE ACETONIDE 10 MG/ML IJ SUSP
10.0000 mg | Freq: Once | INTRAMUSCULAR | Status: AC
  Administered 2020-02-18: 10 mg

## 2020-02-18 NOTE — Telephone Encounter (Signed)
Referral has been placed, called and lm making pt aware.

## 2020-02-19 NOTE — Progress Notes (Signed)
Subjective:   Patient ID: Eduardo Shaw, male   DOB: 75 y.o.   MRN: [id number]   HPI Patient presents stating he has developed pain again in his left ankle and he has developed pain on top of his left foot and both areas are bothering   ROS      Objective:  Physical Exam  Neurovascular status intact with exquisite discomfort within the sinus tarsi left with inflammation and discomfort in the extensor tendon complex left as it crosses over the ankle joint     Assessment:  Sinus tarsitis left extensor tendinitis left     Plan:  H&P reviewed both conditions sterile prep injected the sinus tarsi left 3 mg Dexasone Kenalog 5 mg Xylocaine and injected the extensor complex 3 mg Kenalog 5 mg Xylocaine advised on reduced activity reappoint to recheck

## 2020-03-18 ENCOUNTER — Telehealth: Payer: Self-pay

## 2020-03-18 NOTE — Telephone Encounter (Signed)
atorvastatin (LIPITOR) 20 MG tablet  Pt called in stating [employer] will be calling us about Dr. Jerline Pain approving this medication

## 2020-03-21 ENCOUNTER — Telehealth: Payer: Self-pay

## 2020-03-21 DIAGNOSIS — E782 Mixed hyperlipidemia: Secondary | ICD-10-CM

## 2020-03-21 MED ORDER — ATORVASTATIN CALCIUM 20 MG PO TABS: 20.0000 mg | ORAL_TABLET | Freq: Every day | ORAL | 3 refills | Status: DC

## 2020-03-21 NOTE — Telephone Encounter (Signed)
  LAST APPOINTMENT DATE: 07/10/2019  NEXT APPOINTMENT DATE:@2 /24/2022  MEDICATION: atorvastatin (LIPITOR) 20 MG tablet  PHARMACY: [facility], [address], [address]  Comments: [facility] states that they faxed a refill request over on 2/8 and 2/9 without any follow ups.

## 2020-03-21 NOTE — Telephone Encounter (Signed)
Rx sent to the preferred patient pharmacy  

## 2020-04-03 ENCOUNTER — Encounter: Payer: Self-pay | Admitting: Family Medicine

## 2020-04-03 ENCOUNTER — Other Ambulatory Visit: Payer: Self-pay

## 2020-04-03 ENCOUNTER — Ambulatory Visit (INDEPENDENT_AMBULATORY_CARE_PROVIDER_SITE_OTHER): Payer: Medicare HMO | Admitting: Family Medicine

## 2020-04-03 VITALS — BP 119/61 | HR 58 | Temp 97.2°F | Ht 73.0 in | Wt 195.8 lb

## 2020-04-03 DIAGNOSIS — Z0001 Encounter for general adult medical examination with abnormal findings: Secondary | ICD-10-CM | POA: Diagnosis not present

## 2020-04-03 DIAGNOSIS — H6993 Unspecified Eustachian tube disorder, bilateral: Secondary | ICD-10-CM

## 2020-04-03 DIAGNOSIS — R739 Hyperglycemia, unspecified: Secondary | ICD-10-CM | POA: Diagnosis not present

## 2020-04-03 DIAGNOSIS — Z6825 Body mass index (BMI) 25.0-25.9, adult: Secondary | ICD-10-CM

## 2020-04-03 DIAGNOSIS — I10 Essential (primary) hypertension: Secondary | ICD-10-CM | POA: Diagnosis not present

## 2020-04-03 DIAGNOSIS — K2100 Gastro-esophageal reflux disease with esophagitis, without bleeding: Secondary | ICD-10-CM | POA: Diagnosis not present

## 2020-04-03 DIAGNOSIS — N183 Chronic kidney disease, stage 3 unspecified: Secondary | ICD-10-CM

## 2020-04-03 DIAGNOSIS — M1990 Unspecified osteoarthritis, unspecified site: Secondary | ICD-10-CM

## 2020-04-03 DIAGNOSIS — E785 Hyperlipidemia, unspecified: Secondary | ICD-10-CM | POA: Diagnosis not present

## 2020-04-03 DIAGNOSIS — M4800 Spinal stenosis, site unspecified: Secondary | ICD-10-CM

## 2020-04-03 DIAGNOSIS — Z125 Encounter for screening for malignant neoplasm of prostate: Secondary | ICD-10-CM | POA: Diagnosis not present

## 2020-04-03 DIAGNOSIS — E663 Overweight: Secondary | ICD-10-CM

## 2020-04-03 LAB — COMPREHENSIVE METABOLIC PANEL
ALT: 13 U/L (ref 0–53)
AST: 15 U/L (ref 0–37)
Albumin: 4.1 g/dL (ref 3.5–5.2)
Alkaline Phosphatase: 70 U/L (ref 39–117)
BUN: 29 mg/dL — ABNORMAL HIGH (ref 6–23)
CO2: 30 mEq/L (ref 19–32)
Calcium: 9.5 mg/dL (ref 8.4–10.5)
Chloride: 107 mEq/L (ref 96–112)
Creatinine, Ser: 1.56 mg/dL — ABNORMAL HIGH (ref 0.40–1.50)
GFR: 43.08 mL/min — ABNORMAL LOW (ref >60.00)
Glucose, Bld: 96 mg/dL (ref 70–99)
Potassium: 5 mEq/L (ref 3.5–5.1)
Sodium: 143 mEq/L (ref 135–145)
Total Bilirubin: 0.7 mg/dL (ref 0.2–1.2)
Total Protein: 6.5 g/dL (ref 6.0–8.3)

## 2020-04-03 LAB — TSH: TSH: 1.79 u[IU]/mL (ref 0.35–4.50)

## 2020-04-03 LAB — CBC
HCT: 43.4 % (ref 39.0–52.0)
Hemoglobin: 14.4 g/dL (ref 13.0–17.0)
MCHC: 33.3 g/dL (ref 30.0–36.0)
MCV: 89.3 fl (ref 78.0–100.0)
Platelets: 191 10*3/uL (ref 150.0–400.0)
RBC: 4.86 Mil/uL (ref 4.22–5.81)
RDW: 14.2 % (ref 11.5–15.5)
WBC: 7.1 10*3/uL (ref 4.0–10.5)

## 2020-04-03 LAB — LIPID PANEL
Cholesterol: 176 mg/dL (ref 0–200)
HDL: 57.3 mg/dL (ref >39.00)
LDL Cholesterol: 103 mg/dL — ABNORMAL HIGH (ref 0–99)
NonHDL: 119.09
Total CHOL/HDL Ratio: 3
Triglycerides: 82 mg/dL (ref 0.0–149.0)
VLDL: 16.4 mg/dL (ref 0.0–40.0)

## 2020-04-03 LAB — PSA: PSA: 2.66 ng/mL (ref 0.10–4.00)

## 2020-04-03 LAB — HEMOGLOBIN A1C: Hgb A1c MFr Bld: 6 % (ref 4.6–6.5)

## 2020-04-03 NOTE — Assessment & Plan Note (Signed)
Follows with orthopedics.  Recommended over-the-counter Voltaren gel.  He has been getting start injections routinely in left ankle.

## 2020-04-03 NOTE — Assessment & Plan Note (Signed)
Check A1c today.

## 2020-04-03 NOTE — Assessment & Plan Note (Signed)
Check BMET 

## 2020-04-03 NOTE — Assessment & Plan Note (Signed)
Has been following with orthopedics.  Recently had laminectomy.  Doing well postoperatively.

## 2020-04-03 NOTE — Patient Instructions (Addendum)
It was very nice to see you today!  We will check blood work today.  Please try using a allergy medication for your ears.  Let me know if it does not improve.  I will see you back in year for your next annual checkup.  Please come back to see me sooner if needed.  Take care, Dr Jerline Pain  Please try these tips to maintain a healthy lifestyle:   Eat at least 3 REAL meals and 1-2 snacks per day.  Aim for no more than 5 hours between eating.  If you eat breakfast, please do so within one hour of getting up.    Each meal should contain half fruits/vegetables, one quarter protein, and one quarter carbs (no bigger than a computer mouse)   Cut down on sweet beverages. This includes juice, soda, and sweet tea.     Drink at least 1 glass of water with each meal and aim for at least 8 glasses per day   Exercise at least 150 minutes every week.    Preventive Care 60 Years and Older, Male Preventive care refers to lifestyle choices and visits with your health care provider that can promote health and wellness. This includes:  A yearly physical exam. This is also called an annual wellness visit.  Regular dental and eye exams.  Immunizations.  Screening for certain conditions.  Healthy lifestyle choices, such as: ? Eating a healthy diet. ? Getting regular exercise. ? Not using drugs or products that contain nicotine and tobacco. ? Limiting alcohol use. What can I expect for my preventive care visit? Physical exam Your health care provider will check your:  Height and weight. These may be used to calculate your BMI (body mass index). BMI is a measurement that tells if you are at a healthy weight.  Heart rate and blood pressure.  Body temperature.  Skin for abnormal spots. Counseling Your health care provider may ask you questions about your:  Past medical problems.  Family's medical history.  Alcohol, tobacco, and drug use.  Emotional well-being.  Home life and  relationship well-being.  Sexual activity.  Diet, exercise, and sleep habits.  History of falls.  Memory and ability to understand (cognition).  Work and work Statistician.  Access to firearms. What immunizations do I need? Vaccines are usually given at various ages, according to a schedule. Your health care provider will recommend vaccines for you based on your age, medical history, and lifestyle or other factors, such as travel or where you work.   What tests do I need? Blood tests  Lipid and cholesterol levels. These may be checked every 5 years, or more often depending on your overall health.  Hepatitis C test.  Hepatitis B test. Screening  Lung cancer screening. You may have this screening every year starting at age 76 if you have a 30-pack-year history of smoking and currently smoke or have quit within the past 15 years.  Colorectal cancer screening. ? All adults should have this screening starting at age 58 and continuing until age 7. ? Your health care provider may recommend screening at age 106 if you are at increased risk. ? You will have tests every 1-10 years, depending on your results and the type of screening test.  Prostate cancer screening. Recommendations will vary depending on your family history and other risks.  Genital exam to check for testicular cancer or hernias.  Diabetes screening. ? This is done by checking your blood sugar (glucose) after you have not  eaten for a while (fasting). ? You may have this done every 1-3 years.  Abdominal aortic aneurysm (AAA) screening. You may need this if you are a current or former smoker.  STD (sexually transmitted disease) testing, if you are at risk. Follow these instructions at home: Eating and drinking  Eat a diet that includes fresh fruits and vegetables, whole grains, lean protein, and low-fat dairy products. Limit your intake of foods with high amounts of sugar, saturated fats, and salt.  Take vitamin  and mineral supplements as recommended by your health care provider.  Do not drink alcohol if your health care provider tells you not to drink.  If you drink alcohol: ? Limit how much you have to 0-2 drinks a day. ? Be aware of how much alcohol is in your drink. In the [place] one drink equals one 12 oz bottle of beer (355 mL), one 5 oz glass of wine (148 mL), or one 1 oz glass of hard liquor (44 mL).   Lifestyle  Take daily care of your teeth and gums. Brush your teeth every morning and night with fluoride toothpaste. Floss one time each day.  Stay active. Exercise for at least 30 minutes 5 or more days each week.  Do not use any products that contain nicotine or tobacco, such as cigarettes, e-cigarettes, and chewing tobacco. If you need help quitting, ask your health care provider.  Do not use drugs.  If you are sexually active, practice safe sex. Use a condom or other form of protection to prevent STIs (sexually transmitted infections).  Talk with your health care provider about taking a low-dose aspirin or statin.  Find healthy ways to cope with stress, such as: ? Meditation, yoga, or listening to music. ? Journaling. ? Talking to a trusted person. ? Spending time with friends and family. Safety  Always wear your seat belt while driving or riding in a vehicle.  Do not drive: ? If you have been drinking alcohol. Do not ride with someone who has been drinking. ? When you are tired or distracted. ? While texting.  Wear a helmet and other protective equipment during sports activities.  If you have firearms in your house, make sure you follow all gun safety procedures. What's next?  Visit your health care provider once a year for an annual wellness visit.  Ask your health care provider how often you should have your eyes and teeth checked.  Stay up to date on all vaccines. This information is not intended to replace advice given to you by your health care provider. Make  sure you discuss any questions you have with your health care provider. Document Revised: 10/24/2018 Document Reviewed: 01/19/2018 Elsevier Patient Education  2021 [employer].

## 2020-04-03 NOTE — Assessment & Plan Note (Signed)
Continue Prilosec 40 mg daily.

## 2020-04-03 NOTE — Progress Notes (Signed)
Chief Complaint:  Eduardo Shaw is a 75 y.o. male who presents today for his annual comprehensive physical exam.    Assessment/Plan:  New/Acute Problems: Eustachian Tube Dysfunction Recommended using OTC allergy meds as needed.   Chronic Problems Addressed Today: Hyperlipidemia Check labs.  Continue Lipitor 20 mg daily.  Osteoarthritis Follows with orthopedics.  Recommended over-the-counter Voltaren gel.  He has been getting start injections routinely in left ankle.  Spinal stenosis Has been following with orthopedics.  Recently had laminectomy.  Doing well postoperatively.  Hyperglycemia Check A1c today.  CKD (chronic kidney disease) stage 3, GFR 30-59 ml/min (HCC) Check BMET.  Gastroesophageal reflux disease with esophagitis Continue Prilosec 40 mg daily.  Essential hypertension At goal off medications.   Preventative Healthcare: Check labs. UTD on cancer screening. UTD on vaccines.   Patient Counseling(The following topics were reviewed and/or handout was given):  -Nutrition: Stressed importance of moderation in sodium/caffeine intake, saturated fat and cholesterol, caloric balance, sufficient intake of fresh fruits, vegetables, and fiber.  -Stressed the importance of regular exercise.   -Substance Abuse: Discussed cessation/primary prevention of tobacco, alcohol, or other drug use; driving or other dangerous activities under the influence; availability of treatment for abuse.   -Injury prevention: Discussed safety belts, safety helmets, smoke detector, smoking near bedding or upholstery.   -Sexuality: Discussed sexually transmitted diseases, partner selection, use of condoms, avoidance of unintended pregnancy and contraceptive alternatives.   -Dental health: Discussed importance of regular tooth brushing, flossing, and dental visits.  -Health maintenance and immunizations reviewed. Please refer to Health maintenance section.  Return to care in 1 year for next  preventative visit.     Subjective:  HPI:  He has no acute complaints today.   Lifestyle Diet: None specific.  Exercise: Limited due to recent back surgery. Has been trying to walk more.   Depression screen PHQ 2/9 04/03/2020  Decreased Interest 0  Down, Depressed, Hopeless 0  PHQ - 2 Score 0    Health Maintenance Due  Topic Date Due  . Hepatitis C Screening  Never done     ROS: Per HPI, otherwise a complete review of systems was negative.   PMH:  The following were reviewed and entered/updated in epic: Past Medical History:  Diagnosis Date  . Arthritis of foot   . Carotid artery occlusion   . Cataract   . Colon polyps    adenomatous  . Diverticulosis   . Esophageal stricture   . GERD (gastroesophageal reflux disease)    erosive esophagitis  . Hypercholesterolemia   . Hypertension   . Lactose intolerance   . Sarcoidosis 09/20/2007   Qualifier: Diagnosis of  By: Lenn Cal Deborra Medina), Wynona Canes    . Skin cancer    skin CA-2019  . Stroke [facility]) June 2013   Mini    Patient Active Problem List   Diagnosis Date Noted  . Osteoarthritis 04/03/2020  . Spinal stenosis 10/10/2019  . Hyperglycemia 04/05/2019  . Polyarthralgia 07/04/2018  . CKD (chronic kidney disease) stage 3, GFR 30-59 ml/min (HCC) 03/29/2018  . Gastroesophageal reflux disease with esophagitis 03/28/2018  . Essential hypertension 03/07/2017  . Hyperlipidemia 10/26/2011  . Erectile dysfunction 10/26/2011  . History of skin cancer 10/26/2011  . Carotid stenosis 07/31/2011   Past Surgical History:  Procedure Laterality Date  . CAROTID ENDARTERECTOMY Right August 03, 2011   CE  . COLONOSCOPY     [place]eras ago [place], normal per pt  . ENDARTERECTOMY  08/03/2011   Procedure: ENDARTERECTOMY  CAROTID;  Surgeon: Angelia Mould, MD;  Location: [facility];  Service: Vascular;  Laterality: Right;  . LUMBAR LAMINECTOMY/DECOMPRESSION MICRODISCECTOMY Bilateral 02/23/2014   Procedure: BILATERAL LUMBAR THREE-FOUR  LAMINOTOMY AND MICRODISCECTOMY WITH RIGHT LUMBAR FOUR-FIVE LAMINOTOMY AND MICRODISCECTOMY;  Surgeon: Hosie Spangle, MD;  Location: [facility];  Service: Neurosurgery;  Laterality: Bilateral;  Bilateral L34 laminotomy with Left L34 microdiskectomy and possible Right L34 microdiskectomy and Right L45 Laminotomy and foraminotomy  . MOHS SURGERY     to face  . TONSILLECTOMY    . TRANSFORAMINAL LUMBAR INTERBODY FUSION (TLIF) WITH PEDICLE SCREW FIXATION 1 LEVEL Right 10/10/2019   Procedure: LEFT-SIDED LUMBAR TWO - LUMBAR THREE TRANSFORAMINAL LUMBAR INTERBODY FUSION WITH INSTRUMENTATION AND ALLOGRAFT;  Surgeon: Phylliss Bob, MD;  Location: [facility];  Service: Orthopedics;  Laterality: Right;  LEFT-SIDED LUMBAR TWO - LUMBAR THREE TRANSFORAMINAL LUMBAR INTERBODY FUSION WITH INSTRUMENTATION AND ALLOGRAFT    Family History  Problem Relation Age of Onset  . Lupus Mother   . Stroke Mother   . Diabetes Sister   . Lupus Sister   . Colon cancer Neg Hx   . Colon polyps Neg Hx   . Rectal cancer Neg Hx   . Stomach cancer Neg Hx   . Esophageal cancer Neg Hx   . Pancreatic cancer Neg Hx   . Liver disease Neg Hx   . Kidney disease Neg Hx     Medications- reviewed and updated Current Outpatient Medications  Medication Sig Dispense Refill  . atorvastatin (LIPITOR) 20 MG tablet Take 1 tablet (20 mg total) by mouth daily. 90 tablet 3  . omeprazole (PRILOSEC) 40 MG capsule TAKE 1 CAPSULE BY MOUTH  DAILY (Patient taking differently: Take 40 mg by mouth daily.) 90 capsule 3   No current facility-administered medications for this visit.    Allergies-reviewed and updated Allergies  Allergen Reactions  . Morphine And Related Nausea And Vomiting    Gi upset  . Other     Bee sting - swelling in the areas stung    Social History   Socioeconomic History  . Marital status: Married    Spouse name: Not on file  . Number of children: Not on file  . Years of education: Not on file  . Highest education level: Not  on file  Occupational History  . Not on file  Tobacco Use  . Smoking status: Never Smoker  . Smokeless tobacco: Never Used  Vaping Use  . Vaping Use: Never used  Substance and Sexual Activity  . Alcohol use: Yes    Alcohol/week: 0.0 standard drinks    Comment: 1-2 drinks on weekends    . Drug use: No  . Sexual activity: Not on file  Other Topics Concern  . 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  Finished high school.  Can read and write.  Hasn't seen a doctor in many years.   Social Determinants of Health   Financial Resource Strain: Not on file  Food Insecurity: Not on file  Transportation Needs: Not on file  Physical Activity: Not on file  Stress: Not on file  Social Connections: Not on file        Objective:  Physical Exam: BP 119/61   Pulse (!) 58   Temp (!) 97.2 F (36.2 C) (Temporal)   Ht 6\' 1"  (1.854 m)  Wt 195 lb 12.8 oz (88.8 kg)   SpO2 98%   BMI 25.83 kg/m   Body mass index is 25.83 kg/m. Wt Readings from Last 3 Encounters:  04/03/20 195 lb 12.8 oz (88.8 kg)  10/10/19 190 lb (86.2 kg)  10/03/19 191 lb 12.8 oz (87 kg)   Gen: NAD, resting comfortably HEENT: TMs with clear effusion bilaterally. OP clear. No thyromegaly noted.  CV: RRR with no murmurs appreciated Pulm: NWOB, CTAB with no crackles, wheezes, or rhonchi GI: Normal bowel sounds present. Soft, Nontender, Nondistended. MSK: no edema, cyanosis, or clubbing noted Skin: warm, dry Neuro: CN2-12 grossly intact. Strength 5/5 in upper and lower extremities. Reflexes symmetric and intact bilaterally.  Psych: Normal affect and thought content     Camara Renstrom M. Jerline Pain, MD 04/03/2020 2:24 PM

## 2020-04-03 NOTE — Assessment & Plan Note (Signed)
At goal off medications. 

## 2020-04-03 NOTE — Assessment & Plan Note (Signed)
Check labs.  Continue Lipitor 20 mg daily. 

## 2020-04-04 NOTE — Progress Notes (Signed)
Please inform patient of the following:  Kidney numbers are up but stable compared to previous vaules. Do not need to make any changes to his treatment plan at this time. We can recheck in a year.  Eduardo Shaw. Jerline Pain, MD 04/04/2020 2:14 PM

## 2020-04-09 ENCOUNTER — Other Ambulatory Visit: Payer: Self-pay | Admitting: *Deleted

## 2020-04-09 ENCOUNTER — Telehealth: Payer: Self-pay

## 2020-04-09 DIAGNOSIS — K222 Esophageal obstruction: Secondary | ICD-10-CM

## 2020-04-09 DIAGNOSIS — E782 Mixed hyperlipidemia: Secondary | ICD-10-CM

## 2020-04-09 DIAGNOSIS — K253 Acute gastric ulcer without hemorrhage or perforation: Secondary | ICD-10-CM

## 2020-04-09 DIAGNOSIS — K2100 Gastro-esophageal reflux disease with esophagitis, without bleeding: Secondary | ICD-10-CM

## 2020-04-09 DIAGNOSIS — R1314 Dysphagia, pharyngoesophageal phase: Secondary | ICD-10-CM

## 2020-04-09 MED ORDER — ATORVASTATIN CALCIUM 20 MG PO TABS: 20.0000 mg | ORAL_TABLET | Freq: Every day | ORAL | 3 refills | Status: DC

## 2020-04-09 MED ORDER — OMEPRAZOLE 40 MG PO CPDR: 40.0000 mg | DELAYED_RELEASE_CAPSULE | Freq: Every day | ORAL | 3 refills | Status: DC

## 2020-04-09 NOTE — Telephone Encounter (Signed)
..   LAST APPOINTMENT DATE: 04/03/2020   NEXT APPOINTMENT DATE:3.2.22  MEDICATION:atorvastatin (LIPITOR) 20 MG tablet omeprazole (PRILOSEC) 40 MG capsule     [facility] [place] [place]    Please advise

## 2020-04-09 NOTE — Telephone Encounter (Signed)
Rx send to [facility]

## 2020-04-30 ENCOUNTER — Other Ambulatory Visit: Payer: Self-pay

## 2020-04-30 ENCOUNTER — Ambulatory Visit (HOSPITAL_COMMUNITY)
Admission: RE | Admit: 2020-04-30 | Discharge: 2020-04-30 | Disposition: A | Discharge status: Home / Self Care | Payer: Medicare HMO | Source: Ambulatory Visit | Attending: Vascular Surgery | Admitting: Vascular Surgery

## 2020-04-30 ENCOUNTER — Ambulatory Visit: Payer: Medicare HMO | Admitting: Physician Assistant

## 2020-04-30 VITALS — BP 152/78 | HR 55 | Temp 97.6°F | Resp 20 | Ht 73.0 in | Wt 195.8 lb

## 2020-04-30 DIAGNOSIS — I6523 Occlusion and stenosis of bilateral carotid arteries: Secondary | ICD-10-CM | POA: Diagnosis not present

## 2020-04-30 NOTE — Progress Notes (Signed)
HISTORY AND PHYSICAL     CC:  follow up. Requesting Provider:  Vivi Barrack, MD  HPI: This is a 75 y.o. male here for follow up for carotid artery stenosis.  Pt is s/p right CEA for symptomatic carotid artery stenosis on 08/03/2011 by Dr. Scot Dock.    Pt was last seen 04/12/2019 and at that time he was doing well and not having any strok sx.    Pt returns today for follow up.    Pt denies any amaurosis fugax, speech difficulties, weakness, numbness, paralysis or clumsiness or facial droop.    He has not been on a daily asa as he states he had a couple of surgeries and he stopped it bc of that and never restarted it.    The pt is on a statin for cholesterol management.  The pt is not on a daily aspirin.   Other AC:  none The pt is not on medication for hypertension.   The pt is not diabetic.   Tobacco hx:  never  Pt does not have family hx of AAA.  Past Medical History:  Diagnosis Date  . Arthritis of foot   . Carotid artery occlusion   . Cataract   . Colon polyps    adenomatous  . Diverticulosis   . Esophageal stricture   . GERD (gastroesophageal reflux disease)    erosive esophagitis  . Hypercholesterolemia   . Hypertension   . Lactose intolerance   . Sarcoidosis 09/20/2007   Qualifier: Diagnosis of  By: Lenn Cal Deborra Medina), Wynona Canes    . Skin cancer    skin CA-2019  . Stroke [facility]) June 2013   Mini     Past Surgical History:  Procedure Laterality Date  . CAROTID ENDARTERECTOMY Right August 03, 2011   CE  . COLONOSCOPY     [place], normal per pt  . ENDARTERECTOMY  08/03/2011   Procedure: ENDARTERECTOMY CAROTID;  Surgeon: Angelia Mould, MD;  Location: [place];  Service: Vascular;  Laterality: Right;  . LUMBAR LAMINECTOMY/DECOMPRESSION MICRODISCECTOMY Bilateral 02/23/2014   Procedure: BILATERAL LUMBAR THREE-FOUR LAMINOTOMY AND MICRODISCECTOMY WITH RIGHT LUMBAR FOUR-FIVE LAMINOTOMY AND MICRODISCECTOMY;  Surgeon: Hosie Spangle, MD;  Location: [place];  Service: Neurosurgery;  Laterality: Bilateral;  Bilateral L34 laminotomy with Left L34 microdiskectomy and possible Right L34 microdiskectomy and Right L45 Laminotomy and foraminotomy  . MOHS SURGERY     to face  . TONSILLECTOMY    . TRANSFORAMINAL LUMBAR INTERBODY FUSION (TLIF) WITH PEDICLE SCREW FIXATION 1 LEVEL Right 10/10/2019   Procedure: LEFT-SIDED LUMBAR TWO - LUMBAR THREE TRANSFORAMINAL LUMBAR INTERBODY FUSION WITH INSTRUMENTATION AND ALLOGRAFT;  Surgeon: Phylliss Bob, MD;  Location: [place];  Service: Orthopedics;  Laterality: Right;  LEFT-SIDED LUMBAR TWO - LUMBAR THREE TRANSFORAMINAL LUMBAR INTERBODY FUSION WITH INSTRUMENTATION AND ALLOGRAFT    Allergies  Allergen Reactions  . Morphine And Related Nausea And Vomiting    Gi upset  . Other     Bee sting - swelling in the areas stung    Current Outpatient Medications  Medication Sig Dispense Refill  . atorvastatin (LIPITOR) 20 MG tablet Take 1 tablet (20 mg total) by mouth daily. 90 tablet 3  . omeprazole (PRILOSEC) 40 MG capsule Take 1 capsule (40 mg total) by mouth daily. 90 capsule 3   No current facility-administered medications for this visit.    Family History  Problem Relation Age of Onset  . Lupus Mother   . Stroke Mother   .  Diabetes Sister   . Lupus Sister   . Colon cancer Neg Hx   . Colon polyps Neg Hx   . Rectal cancer Neg Hx   . Stomach cancer Neg Hx   . Esophageal cancer Neg Hx   . Pancreatic cancer Neg Hx   . Liver disease Neg Hx   . Kidney disease Neg Hx     Social History   Socioeconomic History  . Marital status: Married    Spouse name: Not on file  . Number of children: Not on file  . Years of education: Not on file  . Highest education level: Not on file  Occupational History  . Not on file  Tobacco Use  . Smoking status: Never Smoker  . Smokeless tobacco: Never Used  Vaping Use  . Vaping Use: Never used  Substance and Sexual Activity  . Alcohol use: Yes    Alcohol/week: 0.0  standard drinks    Comment: 1-2 drinks on weekends    . Drug use: No  . Sexual activity: Not on file  Other Topics Concern  . 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  Finished high school.  Can read and write.  Hasn't seen a doctor in many years.   Social Determinants of Health   Financial Resource Strain: Not on file  Food Insecurity: Not on file  Transportation Needs: Not on file  Physical Activity: Not on file  Stress: Not on file  Social Connections: Not on file  Intimate Partner Violence: Not on file     REVIEW OF SYSTEMS:   [X]  denotes positive finding, [ ]  denotes negative finding Cardiac  Comments:  Chest pain or chest pressure:    Shortness of breath upon exertion:    Short of breath when lying flat:    Irregular heart rhythm:        Vascular    Pain in calf, thigh, or hip brought on by ambulation:    Pain in feet at night that wakes you up from your sleep:     Blood clot in your veins:    Leg swelling:         Pulmonary    Oxygen at home:    Productive cough:     Wheezing:         Neurologic    Sudden weakness in arms or legs:     Sudden numbness in arms or legs:     Sudden onset of difficulty speaking or slurred speech:    Temporary loss of vision in one eye:     Problems with dizziness:         Gastrointestinal    Blood in stool:     Vomited blood:         Genitourinary    Burning when urinating:     Blood in urine:        Psychiatric    Major depression:         Hematologic    Bleeding problems:    Problems with blood clotting too easily:        Skin    Rashes or ulcers:        Constitutional    Fever or chills:      PHYSICAL EXAMINATION:  Today's Vitals   04/30/20 1128 04/30/20 1131  BP: (!) 161/73 (!) 152/78  Pulse: (!) 55   Resp: 20   Temp: 97.6 F (36.4 C)   TempSrc: Temporal   SpO2: 98%   Weight: 195  lb 12.8 oz (88.8 kg)   Height: 6\' 1"  (1.854 m)    Body mass index is 25.83 kg/m.   General:  WDWN in NAD; vital signs documented above Gait: Not observed HENT: WNL, normocephalic Pulmonary: normal non-labored breathing Cardiac: regular HR, without carotid bruits Abdomen: soft, NT; aortic pulse is not palpable Skin: without rashes Vascular Exam/Pulses:  Right Left  Radial 2+ (normal) 2+ (normal)  Ulnar Unable to palpate Unable to palpate  DP 2+ (normal) 2+ (normal)  PT 2+ (normal) 2+ (normal)   Extremities: without ischemic changes, without Gangrene , without cellulitis; without open wounds Musculoskeletal: no muscle wasting or atrophy  Neurologic: A&O X 3; moving all extremities equally; speech is fluent/normal Psychiatric:  The pt has Normal affect.   Non-Invasive Vascular Imaging:   Carotid Duplex on 04/30/2020: Right:  1-39% ICA stenosis Left:  40-59% ICA stenosis   Previous Carotid duplex on 04/12/2019: Right: 1-39% ICA stenosis Left:   40-59% ICA stenosis    ASSESSMENT/PLAN:: 75 y.o. male here for follow up carotid artery stenosis and is s/p right CEA for symptomatic carotid artery stenosis on 08/03/2011 by Dr. Scot Dock.  -duplex today reveals it is unchanged from previous study a year ago with right 1-39% and left 40-59% ICA stenosis.  He remains asymptomatic.   -discussed s/s of stroke with pt and he understands should he develop any of these sx, he will go to the nearest ER or call 911. -pt will f/u in one year with carotid duplex -pt will call sooner should they have any issues. -continue statin.  He has not been on a daily asa.  I encouraged him to start taking baby asa daily if he can tolerate this.    Leontine Locket, [facility] [phone]  Clinic MD:  Scot Dock

## 2020-05-07 DIAGNOSIS — C44519 Basal cell carcinoma of skin of other part of trunk: Secondary | ICD-10-CM | POA: Diagnosis not present

## 2020-05-07 DIAGNOSIS — L821 Other seborrheic keratosis: Secondary | ICD-10-CM | POA: Diagnosis not present

## 2020-05-07 DIAGNOSIS — Z85828 Personal history of other malignant neoplasm of skin: Secondary | ICD-10-CM | POA: Diagnosis not present

## 2020-05-07 DIAGNOSIS — L570 Actinic keratosis: Secondary | ICD-10-CM | POA: Diagnosis not present

## 2020-05-07 DIAGNOSIS — L820 Inflamed seborrheic keratosis: Secondary | ICD-10-CM | POA: Diagnosis not present

## 2020-12-11 ENCOUNTER — Telehealth: Payer: Self-pay | Admitting: Family Medicine

## 2020-12-11 NOTE — Telephone Encounter (Signed)
Copied from [place] [phone]. Topic: Medicare AWV >> Dec 11, 2020 11:15 AM Harris-Coley, Hannah Beat wrote: Reason for CRM: Left message for patient to schedule Annual Wellness Visit.  Please schedule with Nurse Health Advisor Charlott Rakes, RN at [facility].  Please call [phone] ask for [facility]

## 2020-12-15 DIAGNOSIS — H2513 Age-related nuclear cataract, bilateral: Secondary | ICD-10-CM | POA: Diagnosis not present

## 2020-12-15 DIAGNOSIS — H353131 Nonexudative age-related macular degeneration, bilateral, early dry stage: Secondary | ICD-10-CM | POA: Diagnosis not present

## 2020-12-15 DIAGNOSIS — H40053 Ocular hypertension, bilateral: Secondary | ICD-10-CM | POA: Diagnosis not present

## 2020-12-15 DIAGNOSIS — H5203 Hypermetropia, bilateral: Secondary | ICD-10-CM | POA: Diagnosis not present

## 2021-01-07 ENCOUNTER — Telehealth: Payer: Self-pay

## 2021-01-07 NOTE — Telephone Encounter (Signed)
Pt called to request AAA screening on his f/u appt in 3 months, as his brother recently tested + on his AAA screening. Pt states his brother's doctor told him to make family members aware so they can get screened. This will be done at his March 2023 f/u visit; pt is aware and verbalized understanding.

## 2021-02-12 ENCOUNTER — Ambulatory Visit (INDEPENDENT_AMBULATORY_CARE_PROVIDER_SITE_OTHER): Payer: Medicare HMO

## 2021-02-12 ENCOUNTER — Other Ambulatory Visit: Payer: Self-pay

## 2021-02-12 VITALS — BP 124/78 | HR 78 | Temp 97.1°F | Wt 193.2 lb

## 2021-02-12 DIAGNOSIS — Z0000 Encounter for general adult medical examination without abnormal findings: Secondary | ICD-10-CM | POA: Diagnosis not present

## 2021-02-12 NOTE — Progress Notes (Addendum)
Subjective:   Eduardo Shaw is a 76 y.o. male who presents for Medicare Annual/Subsequent preventive examination.  Review of Systems     Cardiac Risk Factors include: advanced age (>74men, >50 women);hypertension;dyslipidemia;male gender     Objective:    Today's Vitals   02/12/21 1309  BP: 124/78  Pulse: 78  Temp: (!) 97.1 F (36.2 C)  SpO2: 94%  Weight: 193 lb 3.2 oz (87.6 kg)   Body mass index is 25.49 kg/m.  Advanced Directives 02/12/2021 10/03/2019 10/02/2018 11/03/2016 10/29/2015 10/23/2014 07/09/2014  Does Patient Have a Medical Advance Directive? Yes Yes Yes Yes - No Yes  Type of Advance Directive Living will [place];Living will Living will;Healthcare Power of [place] will - Living will  Does patient want to make changes to medical advance directive? - No - Patient declined No - Patient declined - - - -  Copy of [place] in Chart? - No - copy requested No - copy requested No - copy requested - - -  Would patient like information on creating a medical advance directive? - - - - - Yes - Educational materials given -    Current Medications (verified) Outpatient Encounter Medications as of 02/12/2021  Medication Sig   atorvastatin (LIPITOR) 20 MG tablet Take 1 tablet (20 mg total) by mouth daily.   omeprazole (PRILOSEC) 40 MG capsule Take 1 capsule (40 mg total) by mouth daily.   FLUAD QUADRIVALENT 0.5 ML injection    PFIZER COVID-19 VAC BIVALENT injection    No facility-administered encounter medications on file as of 02/12/2021.    Allergies (verified) Morphine and related and Other   History: Past Medical History:  Diagnosis Date   Arthritis of foot    Carotid artery occlusion    Cataract    Colon polyps    adenomatous   Diverticulosis    Esophageal stricture    GERD (gastroesophageal reflux disease)    erosive esophagitis   Hypercholesterolemia    Hypertension    Lactose  intolerance    Sarcoidosis 09/20/2007   Qualifier: Diagnosis of  By: Lenn Cal Deborra Medina), Susanne     Skin cancer    skin CA-2019   Stroke [facility]) June 2013   Mini    Past Surgical History:  Procedure Laterality Date   CAROTID ENDARTERECTOMY Right August 03, 2011   CE   COLONOSCOPY     [place]eras [place], normal per pt   ENDARTERECTOMY  08/03/2011   Procedure: ENDARTERECTOMY CAROTID;  Surgeon: Angelia Mould, MD;  Location: [place];  Service: Vascular;  Laterality: Right;   LUMBAR LAMINECTOMY/DECOMPRESSION MICRODISCECTOMY Bilateral 02/23/2014   Procedure: BILATERAL LUMBAR THREE-FOUR LAMINOTOMY AND MICRODISCECTOMY WITH RIGHT LUMBAR FOUR-FIVE LAMINOTOMY AND MICRODISCECTOMY;  Surgeon: Hosie Spangle, MD;  Location: [place];  Service: Neurosurgery;  Laterality: Bilateral;  Bilateral L34 laminotomy with Left L34 microdiskectomy and possible Right L34 microdiskectomy and Right L45 Laminotomy and foraminotomy   MOHS SURGERY     to face   TONSILLECTOMY     TRANSFORAMINAL LUMBAR INTERBODY FUSION (TLIF) WITH PEDICLE SCREW FIXATION 1 LEVEL Right 10/10/2019   Procedure: LEFT-SIDED LUMBAR TWO - LUMBAR THREE TRANSFORAMINAL LUMBAR INTERBODY FUSION WITH INSTRUMENTATION AND ALLOGRAFT;  Surgeon: Phylliss Bob, MD;  Location: [place];  Service: Orthopedics;  Laterality: Right;  LEFT-SIDED LUMBAR TWO - LUMBAR THREE TRANSFORAMINAL LUMBAR INTERBODY FUSION WITH INSTRUMENTATION AND ALLOGRAFT   Family History  Problem Relation Age of Onset  Lupus Mother    Stroke Mother    Diabetes Sister    Lupus Sister    Colon cancer Neg Hx    Colon polyps Neg Hx    Rectal cancer Neg Hx    Stomach cancer Neg Hx    Esophageal cancer Neg Hx    Pancreatic cancer Neg Hx    Liver disease Neg Hx    Kidney disease Neg Hx    Social History   Socioeconomic History   Marital status: Married    Spouse name: Not on file   Number of children: Not on file   Years of education: Not on file   Highest education level: Not on  file  Occupational History   Not on file  Tobacco Use   Smoking status: Never   Smokeless tobacco: Never  Vaping Use   Vaping Use: Never used  Substance and Sexual Activity   Alcohol use: Yes    Alcohol/week: 0.0 standard drinks    Comment: 1-2 drinks on weekends     Drug use: No   Sexual activity: Not on file  Other Topics Concern   Not on file  Social History Narrative   ** Merged History Encounter **       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  Finished high school.  Can read and write.  Hasn't seen a doctor in many years.   Social Determinants of Health   Financial Resource Strain: Low Risk    Difficulty of Paying Living Expenses: Not hard at all  Food Insecurity: No Food Insecurity   Worried About Charity fundraiser in the Last Year: Never true   [place] in the Last Year: Never true  Transportation Needs: No Transportation Needs   Lack of Transportation (Medical): No   Lack of Transportation (Non-Medical): No  Physical Activity: Sufficiently Active   Days of Exercise per Week: 3 days   Minutes of Exercise per Session: 60 min  Stress: No Stress Concern Present   Feeling of Stress : Not at all  Social Connections: Moderately Integrated   Frequency of Communication with Friends and Family: Once a week   Frequency of Social Gatherings with Friends and Family: More than three times a week   Attends Religious Services: More than 4 times per year   Active Member of [employer] or Organizations: No   Attends Music therapist: Never   Marital Status: Married    Tobacco Counseling Counseling given: Not Answered   Clinical Intake:  Pre-visit preparation completed: Yes  Pain : No/denies pain     BMI - recorded: 25.49 Nutritional Status: BMI 25 -29 Overweight Diabetes: No  How often do you need to have someone help you when you read instructions, pamphlets, or other written materials from your doctor or  pharmacy?: 1 - Never  Diabetic?no  Interpreter Needed?: No  Information entered by :: Charlott Rakes, LPN   Activities of Daily Living In your present state of health, do you have any difficulty performing the following activities: 02/12/2021  Hearing? N  Vision? N  Difficulty concentrating or making decisions? N  Walking or climbing stairs? N  Dressing or bathing? N  Doing errands, shopping? N  Preparing Food and eating ? N  Using the Toilet? N  In the past six months, have you accidently leaked urine? N  Do you have problems with loss of bowel control? N  Managing your Medications? N  Managing your Finances? N  Housekeeping or managing your Housekeeping? N  Some recent data might be hidden    Patient Care Team: Vivi Barrack, MD as PCP - General (Family Medicine) Calvert Cantor, MD as Consulting Physician (Ophthalmology) Elam Dutch, MD (Inactive) as Consulting Physician (Vascular Surgery)  Indicate any recent Medical Services you may have received from other than [facility] providers in the past year (date may be approximate).     Assessment:   This is a routine wellness examination for [place].  Hearing/Vision screen Hearing Screening - Comments:: Pt denies any hearing issues  Vision Screening - Comments:: Pt follow up with [facility] opthalmology for annual eye exams   Dietary issues and exercise activities discussed: Current Exercise Habits: Home exercise routine, Type of exercise: walking, Time (Minutes): 60, Frequency (Times/Week): 3, Weekly Exercise (Minutes/Week): 180   Goals Addressed             This Visit's Progress    Patient Stated       Keeping labs under control with exercise        Depression Screen PHQ 2/9 Scores 02/12/2021 04/03/2020 04/03/2019 10/02/2018 03/28/2018 01/19/2016 02/13/2014  PHQ - 2 Score 0 0 0 0 0 0 0    Fall Risk Fall Risk  02/12/2021 04/03/2020 04/03/2019 10/02/2018 03/28/2018  Falls in the past year? 0 0 0 0 0  Number falls in  past yr: 0 - - 0 -  Injury with Fall? 0 - - 0 -  Risk for fall due to : Impaired vision - - - -  Follow up Falls prevention discussed - - Education provided -    FALL RISK PREVENTION PERTAINING TO THE HOME:  Any stairs in or around the home? Yes  If so, are there any without handrails? No  Home free of loose throw rugs in walkways, pet beds, electrical cords, etc? Yes  Adequate lighting in your home to reduce risk of falls? Yes   ASSISTIVE DEVICES UTILIZED TO PREVENT FALLS:  Life alert? No  Use of a cane, walker or w/c? No  Grab bars in the bathroom? No  Shower chair or bench in shower? Yes  Elevated toilet seat or a handicapped toilet? No   TIMED UP AND GO:  Was the test performed? Yes .  Length of time to ambulate 10 feet: 15 sec.   Gait steady and fast without use of assistive device  Cognitive Function:     6CIT Screen 02/12/2021  What Year? 0 points  What month? 0 points  What time? 0 points  Count back from 20 0 points  Months in reverse 0 points  Repeat phrase 0 points  Total Score 0    Immunizations Immunization History  Administered Date(s) Administered   Influenza Split 10/26/2011   Influenza, High Dose Seasonal PF 01/19/2016, 11/14/2017, 10/21/2018   Influenza-Unspecified 02/09/2019, 11/18/2020   PFIZER(Purple Top)SARS-COV-2 Vaccination 03/02/2019, 03/23/2019, 11/29/2019   Pneumococcal Conjugate-13 07/04/2014   Pneumococcal Polysaccharide-23 01/19/2016   Tdap 10/26/2011   Zoster Recombinat (Shingrix) 10/21/2018, 12/20/2018    TDAP status: Up to date  Flu Vaccine status: Up to date  Pneumococcal vaccine status: Up to date  Covid-19 vaccine status: Completed vaccines  Qualifies for Shingles Vaccine? Yes   Zostavax completed Yes   Shingrix Completed?: Yes  Screening Tests Health Maintenance  Topic Date Due   Hepatitis C Screening  Never done   COVID-19 Vaccine (4 - Booster for Pfizer series) 01/24/2020   TETANUS/TDAP  10/25/2021  COLONOSCOPY (Pts 45-38yrs Insurance coverage will need to be confirmed)  09/03/2024   Pneumonia Vaccine 1+ Years old  Completed   INFLUENZA VACCINE  Completed   Zoster Vaccines- Shingrix  Completed   HPV VACCINES  Aged Out    Health Maintenance  Health Maintenance Due  Topic Date Due   Hepatitis C Screening  Never done   COVID-19 Vaccine (4 - Booster for Pfizer series) 01/24/2020    Colorectal cancer screening: Type of screening: Colonoscopy. Completed 09/04/19. Repeat every 5 years  Additional Screening:  Hepatitis C Screening: does qualify;   Vision Screening: Recommended annual ophthalmology exams for early detection of glaucoma and other disorders of the eye. Is the patient up to date with their annual eye exam?  Yes  Who is the provider or what is the name of the office in which the patient attends annual eye exams? [facility] opthalmology  If pt is not established with a provider, would they like to be referred to a provider to establish care? No .   Dental Screening: Recommended annual dental exams for proper oral hygiene  Community Resource Referral / Chronic Care Management: CRR required this visit?  No   CCM required this visit?  No      Plan:     I have personally reviewed and noted the following in the patients chart:   Medical and social history Use of alcohol, tobacco or illicit drugs  Current medications and supplements including opioid prescriptions. Patient is not currently taking opioid prescriptions. Functional ability and status Nutritional status Physical activity Advanced directives List of other physicians Hospitalizations, surgeries, and ER visits in previous 12 months Vitals Screenings to include cognitive, depression, and falls Referrals and appointments  In addition, I have reviewed and discussed with patient certain preventive protocols, quality metrics, and best practice recommendations. A written personalized care plan for preventive  services as well as general preventive health recommendations were provided to patient.     Willette Brace, LPN   [id number]/4888   Nurse Notes: None

## 2021-02-12 NOTE — Patient Instructions (Signed)
[name] , Thank you for taking time to come for your Medicare Wellness Visit. I appreciate your ongoing commitment to your health goals. Please review the following plan we discussed and let me know if I can assist you in the future.   Screening recommendations/referrals: Colonoscopy: Done 09/04/19 repeat every 5 years  Recommended yearly ophthalmology/optometry visit for glaucoma screening and checkup Recommended yearly dental visit for hygiene and checkup  Vaccinations: Influenza vaccine: Done 11/18/20 Pneumococcal vaccine: Up to date Tdap vaccine: Done 10/26/11 repeat every 10 years  Shingles vaccine: Completed 9/12, 12/20/18   Covid-19: Completed 1/22, 2/12, 11/29/19  Advanced directives: Please bring a copy of your health care power of attorney and living will to the office at your convenience.  Conditions/risks identified: keep labs under control with exercise   Next appointment: Follow up in one year for your annual wellness visit.   Preventive Care 77 Years and Older, Male Preventive care refers to lifestyle choices and visits with your health care provider that can promote health and wellness. What does preventive care include? A yearly physical exam. This is also called an annual well check. Dental exams once or twice a year. Routine eye exams. Ask your health care provider how often you should have your eyes checked. Personal lifestyle choices, including: Daily care of your teeth and gums. Regular physical activity. Eating a healthy diet. Avoiding tobacco and drug use. Limiting alcohol use. Practicing safe sex. Taking low doses of aspirin every day. Taking vitamin and mineral supplements as recommended by your health care provider. What happens during an annual well check? The services and screenings done by your health care provider during your annual well check will depend on your age, overall health, lifestyle risk factors, and family history of  disease. Counseling  Your health care provider may ask you questions about your: Alcohol use. Tobacco use. Drug use. Emotional well-being. Home and relationship well-being. Sexual activity. Eating habits. History of falls. Memory and ability to understand (cognition). Work and work Statistician. Screening  You may have the following tests or measurements: Height, weight, and BMI. Blood pressure. Lipid and cholesterol levels. These may be checked every 5 years, or more frequently if you are over 68 years old. Skin check. Lung cancer screening. You may have this screening every year starting at age 50 if you have a 30-pack-year history of smoking and currently smoke or have quit within the past 15 years. Fecal occult blood test (FOBT) of the stool. You may have this test every year starting at age 44. Flexible sigmoidoscopy or colonoscopy. You may have a sigmoidoscopy every 5 years or a colonoscopy every 10 years starting at age 61. Prostate cancer screening. Recommendations will vary depending on your family history and other risks. Hepatitis C blood test. Hepatitis B blood test. Sexually transmitted disease (STD) testing. Diabetes screening. This is done by checking your blood sugar (glucose) after you have not eaten for a while (fasting). You may have this done every 1-3 years. Abdominal aortic aneurysm (AAA) screening. You may need this if you are a current or former smoker. Osteoporosis. You may be screened starting at age 66 if you are at high risk. Talk with your health care provider about your test results, treatment options, and if necessary, the need for more tests. Vaccines  Your health care provider may recommend certain vaccines, such as: Influenza vaccine. This is recommended every year. Tetanus, diphtheria, and acellular pertussis (Tdap, Td) vaccine. You may need a Td booster every 10  years. Zoster vaccine. You may need this after age 9. Pneumococcal 13-valent  conjugate (PCV13) vaccine. One dose is recommended after age 62. Pneumococcal polysaccharide (PPSV23) vaccine. One dose is recommended after age 82. Talk to your health care provider about which screenings and vaccines you need and how often you need them. This information is not intended to replace advice given to you by your health care provider. Make sure you discuss any questions you have with your health care provider. Document Released: 02/21/2015 Document Revised: 10/15/2015 Document Reviewed: 11/26/2014 Elsevier Interactive Patient Education  2017 [place] Prevention in the Home Falls can cause injuries. They can happen to people of all ages. There are many things you can do to make your home safe and to help prevent falls. What can I do on the outside of my home? Regularly fix the edges of walkways and driveways and fix any cracks. Remove anything that might make you trip as you walk through a door, such as a raised step or threshold. Trim any bushes or trees on the path to your home. Use bright outdoor lighting. Clear any walking paths of anything that might make someone trip, such as rocks or tools. Regularly check to see if handrails are loose or broken. Make sure that both sides of any steps have handrails. Any raised decks and porches should have guardrails on the edges. Have any leaves, snow, or ice cleared regularly. Use sand or salt on walking paths during winter. Clean up any spills in your garage right away. This includes oil or grease spills. What can I do in the bathroom? Use night lights. Install grab bars by the toilet and in the tub and shower. Do not use towel bars as grab bars. Use non-skid mats or decals in the tub or shower. If you need to sit down in the shower, use a plastic, non-slip stool. Keep the floor dry. Clean up any water that spills on the floor as soon as it happens. Remove soap buildup in the tub or shower regularly. Attach bath mats  securely with double-sided non-slip rug tape. Do not have throw rugs and other things on the floor that can make you trip. What can I do in the bedroom? Use night lights. Make sure that you have a light by your bed that is easy to reach. Do not use any sheets or blankets that are too big for your bed. They should not hang down onto the floor. Have a firm chair that has side arms. You can use this for support while you get dressed. Do not have throw rugs and other things on the floor that can make you trip. What can I do in the kitchen? Clean up any spills right away. Avoid walking on wet floors. Keep items that you use a lot in easy-to-reach places. If you need to reach something above you, use a strong step stool that has a grab bar. Keep electrical cords out of the way. Do not use floor polish or wax that makes floors slippery. If you must use wax, use non-skid floor wax. Do not have throw rugs and other things on the floor that can make you trip. What can I do with my stairs? Do not leave any items on the stairs. Make sure that there are handrails on both sides of the stairs and use them. Fix handrails that are broken or loose. Make sure that handrails are as long as the stairways. Check any carpeting to make sure  that it is firmly attached to the stairs. Fix any carpet that is loose or worn. Avoid having throw rugs at the top or bottom of the stairs. If you do have throw rugs, attach them to the floor with carpet tape. Make sure that you have a light switch at the top of the stairs and the bottom of the stairs. If you do not have them, ask someone to add them for you. What else can I do to help prevent falls? Wear shoes that: Do not have high heels. Have rubber bottoms. Are comfortable and fit you well. Are closed at the toe. Do not wear sandals. If you use a stepladder: Make sure that it is fully opened. Do not climb a closed stepladder. Make sure that both sides of the stepladder  are locked into place. Ask someone to hold it for you, if possible. Clearly mark and make sure that you can see: Any grab bars or handrails. First and last steps. Where the edge of each step is. Use tools that help you move around (mobility aids) if they are needed. These include: Canes. Walkers. Scooters. Crutches. Turn on the lights when you go into a dark area. Replace any light bulbs as soon as they burn out. Set up your furniture so you have a clear path. Avoid moving your furniture around. If any of your floors are uneven, fix them. If there are any pets around you, be aware of where they are. Review your medicines with your doctor. Some medicines can make you feel dizzy. This can increase your chance of falling. Ask your doctor what other things that you can do to help prevent falls. This information is not intended to replace advice given to you by your health care provider. Make sure you discuss any questions you have with your health care provider. Document Released: 11/21/2008 Document Revised: 07/03/2015 Document Reviewed: 03/01/2014 Elsevier Interactive Patient Education  2017 [employer].

## 2021-03-12 DIAGNOSIS — H40053 Ocular hypertension, bilateral: Secondary | ICD-10-CM | POA: Diagnosis not present

## 2021-04-01 ENCOUNTER — Other Ambulatory Visit: Payer: Self-pay | Admitting: Family Medicine

## 2021-04-01 DIAGNOSIS — E782 Mixed hyperlipidemia: Secondary | ICD-10-CM

## 2021-04-09 ENCOUNTER — Ambulatory Visit (INDEPENDENT_AMBULATORY_CARE_PROVIDER_SITE_OTHER): Payer: Medicare HMO | Admitting: Family Medicine

## 2021-04-09 ENCOUNTER — Encounter: Payer: Self-pay | Admitting: Family Medicine

## 2021-04-09 ENCOUNTER — Other Ambulatory Visit: Payer: Self-pay

## 2021-04-09 VITALS — BP 138/84 | HR 60 | Temp 98.2°F | Ht 73.0 in | Wt 201.0 lb

## 2021-04-09 DIAGNOSIS — R739 Hyperglycemia, unspecified: Secondary | ICD-10-CM

## 2021-04-09 DIAGNOSIS — K2100 Gastro-esophageal reflux disease with esophagitis, without bleeding: Secondary | ICD-10-CM | POA: Diagnosis not present

## 2021-04-09 DIAGNOSIS — R1314 Dysphagia, pharyngoesophageal phase: Secondary | ICD-10-CM | POA: Diagnosis not present

## 2021-04-09 DIAGNOSIS — K222 Esophageal obstruction: Secondary | ICD-10-CM

## 2021-04-09 DIAGNOSIS — K253 Acute gastric ulcer without hemorrhage or perforation: Secondary | ICD-10-CM

## 2021-04-09 DIAGNOSIS — R351 Nocturia: Secondary | ICD-10-CM

## 2021-04-09 DIAGNOSIS — I10 Essential (primary) hypertension: Secondary | ICD-10-CM

## 2021-04-09 DIAGNOSIS — Z125 Encounter for screening for malignant neoplasm of prostate: Secondary | ICD-10-CM

## 2021-04-09 DIAGNOSIS — E785 Hyperlipidemia, unspecified: Secondary | ICD-10-CM

## 2021-04-09 DIAGNOSIS — Z0001 Encounter for general adult medical examination with abnormal findings: Secondary | ICD-10-CM

## 2021-04-09 DIAGNOSIS — Z136 Encounter for screening for cardiovascular disorders: Secondary | ICD-10-CM

## 2021-04-09 LAB — COMPREHENSIVE METABOLIC PANEL
ALT: 10 U/L (ref 0–53)
AST: 15 U/L (ref 0–37)
Albumin: 4 g/dL (ref 3.5–5.2)
Alkaline Phosphatase: 57 U/L (ref 39–117)
BUN: 27 mg/dL — ABNORMAL HIGH (ref 6–23)
CO2: 29 mEq/L (ref 19–32)
Calcium: 9.3 mg/dL (ref 8.4–10.5)
Chloride: 108 mEq/L (ref 96–112)
Creatinine, Ser: 1.58 mg/dL — ABNORMAL HIGH (ref 0.40–1.50)
GFR: 42.13 mL/min — ABNORMAL LOW (ref >60.00)
Glucose, Bld: 82 mg/dL (ref 70–99)
Potassium: 4.6 mEq/L (ref 3.5–5.1)
Sodium: 144 mEq/L (ref 135–145)
Total Bilirubin: 0.6 mg/dL (ref 0.2–1.2)
Total Protein: 6.3 g/dL (ref 6.0–8.3)

## 2021-04-09 LAB — TSH: TSH: 2.5 u[IU]/mL (ref 0.35–5.50)

## 2021-04-09 LAB — HEMOGLOBIN A1C: Hgb A1c MFr Bld: 6 % (ref 4.6–6.5)

## 2021-04-09 LAB — CBC
HCT: 39.9 % (ref 39.0–52.0)
Hemoglobin: 13.3 g/dL (ref 13.0–17.0)
MCHC: 33.3 g/dL (ref 30.0–36.0)
MCV: 90.1 fl (ref 78.0–100.0)
Platelets: 212 10*3/uL (ref 150.0–400.0)
RBC: 4.42 Mil/uL (ref 4.22–5.81)
RDW: 14 % (ref 11.5–15.5)
WBC: 7.8 10*3/uL (ref 4.0–10.5)

## 2021-04-09 LAB — LIPID PANEL
Cholesterol: 198 mg/dL (ref 0–200)
HDL: 52 mg/dL (ref >39.00)
LDL Cholesterol: 125 mg/dL — ABNORMAL HIGH (ref 0–99)
NonHDL: 145.85
Total CHOL/HDL Ratio: 4
Triglycerides: 102 mg/dL (ref 0.0–149.0)
VLDL: 20.4 mg/dL (ref 0.0–40.0)

## 2021-04-09 LAB — PSA: PSA: 3.11 ng/mL (ref 0.10–4.00)

## 2021-04-09 NOTE — Assessment & Plan Note (Signed)
At goal off medications. 

## 2021-04-09 NOTE — Progress Notes (Signed)
Chief Complaint:  Eduardo Shaw is a 76 y.o. male who presents today for his annual comprehensive physical exam.    Assessment/Plan:  New/Acute Problems: Family History of AAA We will check screening ultrasound.  He will follow-up with the surgery to further discuss.  Chronic Problems Addressed Today: Hyperlipidemia Check labs.  Continue Lipitor 20 mg daily.  Hyperglycemia Check A1c.  Continue lifestyle modifications.  Gastroesophageal reflux disease with esophagitis Continue Prilosec 40 mg daily.  Essential hypertension At goal off medications.   Preventative Healthcare: Will get blood work done today. UTD on vaccines and screenings.  Patient Counseling(The following topics were reviewed and/or handout was given):  -Nutrition: Stressed importance of moderation in sodium/caffeine intake, saturated fat and cholesterol, caloric balance, sufficient intake of fresh fruits, vegetables, and fiber.  -Stressed the importance of regular exercise.   -Substance Abuse: Discussed cessation/primary prevention of tobacco, alcohol, or other drug use; driving or other dangerous activities under the influence; availability of treatment for abuse.   -Injury prevention: Discussed safety belts, safety helmets, smoke detector, smoking near bedding or upholstery.   -Sexuality: Discussed sexually transmitted diseases, partner selection, use of condoms, avoidance of unintended pregnancy and contraceptive alternatives.   -Dental health: Discussed importance of regular tooth brushing, flossing, and dental visits.  -Health maintenance and immunizations reviewed. Please refer to Health maintenance section.  Return to care in 1 year for next preventative visit.     Subjective:  HPI:  He has no acute complaints today.   He is requesting referral for abdominal ultrasound. He notes his brother was found to have abdominal aortic aneurysm. He is concerned about this issue and would like to be tested for  it. Additionally, he is requesting refill on Omeprazole.  Has some leg swelling in left ankle. This has been going on for years. He does have a hx of arthritis.   Lifestyle Diet: None specific.  Exercise: Trying to exercise.   Depression screen PHQ 2/9 04/09/2021  Decreased Interest 0  Down, Depressed, Hopeless 0  PHQ - 2 Score 0    There are no preventive care reminders to display for this patient.    ROS: Per HPI, otherwise a complete review of systems was negative.   PMH:  The following were reviewed and entered/updated in epic: Past Medical History:  Diagnosis Date   Arthritis of foot    Carotid artery occlusion    Cataract    Colon polyps    adenomatous   Diverticulosis    Esophageal stricture    GERD (gastroesophageal reflux disease)    erosive esophagitis   Hypercholesterolemia    Hypertension    Lactose intolerance    Sarcoidosis 09/20/2007   Qualifier: Diagnosis of  By: Truman Hayward Duncan Dull), Susanne     Skin cancer    skin CA-2019   Stroke [facility]) June 2013   Mini    Patient Active Problem List   Diagnosis Date Noted   Osteoarthritis 04/03/2020   Spinal stenosis 10/10/2019   Hyperglycemia 04/05/2019   Polyarthralgia 07/04/2018   CKD (chronic kidney disease) stage 3, GFR 30-59 ml/min (HCC) 03/29/2018   Gastroesophageal reflux disease with esophagitis 03/28/2018   Essential hypertension 03/07/2017   Hyperlipidemia 10/26/2011   Erectile dysfunction 10/26/2011   History of skin cancer 10/26/2011   Carotid stenosis 07/31/2011   Past Surgical History:  Procedure Laterality Date   CAROTID ENDARTERECTOMY Right August 03, 2011   CE   COLONOSCOPY     [place] ago [place], normal  per pt   ENDARTERECTOMY  08/03/2011   Procedure: ENDARTERECTOMY CAROTID;  Surgeon: Chuck Hint, MD;  Location: [facility] OR;  Service: Vascular;  Laterality: Right;   LUMBAR LAMINECTOMY/DECOMPRESSION MICRODISCECTOMY Bilateral 02/23/2014   Procedure: BILATERAL LUMBAR THREE-FOUR  LAMINOTOMY AND MICRODISCECTOMY WITH RIGHT LUMBAR FOUR-FIVE LAMINOTOMY AND MICRODISCECTOMY;  Surgeon: Hewitt Shorts, MD;  Location: [facility] OR;  Service: Neurosurgery;  Laterality: Bilateral;  Bilateral L34 laminotomy with Left L34 microdiskectomy and possible Right L34 microdiskectomy and Right L45 Laminotomy and foraminotomy   MOHS SURGERY     to face   TONSILLECTOMY     TRANSFORAMINAL LUMBAR INTERBODY FUSION (TLIF) WITH PEDICLE SCREW FIXATION 1 LEVEL Right 10/10/2019   Procedure: LEFT-SIDED LUMBAR TWO - LUMBAR THREE TRANSFORAMINAL LUMBAR INTERBODY FUSION WITH INSTRUMENTATION AND ALLOGRAFT;  Surgeon: Estill Bamberg, MD;  Location: [facility] OR;  Service: Orthopedics;  Laterality: Right;  LEFT-SIDED LUMBAR TWO - LUMBAR THREE TRANSFORAMINAL LUMBAR INTERBODY FUSION WITH INSTRUMENTATION AND ALLOGRAFT    Family History  Problem Relation Age of Onset   Lupus Mother    Stroke Mother    Diabetes Sister    Lupus Sister    Colon cancer Neg Hx    Colon polyps Neg Hx    Rectal cancer Neg Hx    Stomach cancer Neg Hx    Esophageal cancer Neg Hx    Pancreatic cancer Neg Hx    Liver disease Neg Hx    Kidney disease Neg Hx     Medications- reviewed and updated Current Outpatient Medications  Medication Sig Dispense Refill   atorvastatin (LIPITOR) 20 MG tablet TAKE 1 TABLET (20 MG TOTAL) BY MOUTH DAILY. 90 tablet 3   omeprazole (PRILOSEC) 40 MG capsule Take 1 capsule (40 mg total) by mouth daily. 90 capsule 3   No current facility-administered medications for this visit.    Allergies-reviewed and updated Allergies  Allergen Reactions   Morphine And Related Nausea And Vomiting    Gi upset   Other     Bee sting - swelling in the areas stung    Social History   Socioeconomic History   Marital status: Married    Spouse name: Not on file   Number of children: Not on file   Years of education: Not on file   Highest education level: Not on file  Occupational History   Not on file  Tobacco Use    Smoking status: Never   Smokeless tobacco: Never  Vaping Use   Vaping Use: Never used  Substance and Sexual Activity   Alcohol use: Yes    Alcohol/week: 0.0 standard drinks    Comment: 1-2 drinks on weekends     Drug use: No   Sexual activity: Not on file  Other Topics Concern   Not on file  Social History Narrative   ** Merged History Encounter **       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  Finished high school.  Can read and write.  Hasn't seen a doctor in many years.   Social Determinants of Health   Financial Resource Strain: Low Risk    Difficulty of Paying Living Expenses: Not hard at all  Food Insecurity: No Food Insecurity   Worried About Programme researcher, broadcasting/film/video in the Last Year: Never true   Ran Out of Food in the Last Year: Never true  Transportation Needs: No Transportation Needs   Lack of Transportation (Medical): No   Lack of Transportation (Non-Medical): No  Physical Activity: Sufficiently Active   Days of Exercise per Week: 3 days   Minutes of Exercise per Session: 60 min  Stress: No Stress Concern Present   Feeling of Stress : Not at all  Social Connections: Moderately Integrated   Frequency of Communication with Friends and Family: Once a week   Frequency of Social Gatherings with Friends and Family: More than three times a week   Attends Religious Services: More than 4 times per year   Active Member of [employer] or Organizations: No   Attends Engineer, structural: Never   Marital Status: Married        Objective:  Physical Exam: BP (!) 148/82 (BP Location: Right Arm)   Pulse 60   Temp 98.2 F (36.8 C) (Temporal)   Ht 6\' 1"  (1.854 m)   Wt 201 lb (91.2 kg)   SpO2 94%   BMI 26.52 kg/m   Body mass index is 26.52 kg/m. Wt Readings from Last 3 Encounters:  04/09/21 201 lb (91.2 kg)  02/12/21 193 lb 3.2 oz (87.6 kg)  04/30/20 195 lb 12.8 oz (88.8 kg)   Gen: NAD, resting comfortably HEENT: TMs normal  bilaterally. OP clear. No thyromegaly noted.  CV: RRR with no murmurs appreciated Pulm: NWOB, CTAB with no crackles, wheezes, or rhonchi GI: Normal bowel sounds present. Soft, Nontender, Nondistended. MSK: no edema, cyanosis, or clubbing noted Skin: warm, dry Neuro: CN2-12 grossly intact. Strength 5/5 in upper and lower extremities. Reflexes symmetric and intact bilaterally.  Psych: Normal affect and thought content      I,Savera Zaman,acting as a scribe for Jacquiline Doe, MD.,have documented all relevant documentation on the behalf of Jacquiline Doe, MD,as directed by  Jacquiline Doe, MD while in the presence of Jacquiline Doe, MD.   I, Jacquiline Doe, MD, have reviewed all documentation for this visit. The documentation on 04/09/21 for the exam, diagnosis, procedures, and orders are all accurate and complete.  Katina Degree. Jimmey Ralph, MD 04/09/2021 1:56 PM

## 2021-04-09 NOTE — Patient Instructions (Signed)
It was very nice to see you today! ? ?We will check blood work today. ? ?We will get an ultrasound to screen for AAA. ? ?Please continue to work on diet and exercise. ? ?I will see back in year for your next physical.  Come back sooner if needed. ? ?Take care, ?Dr Jerline Pain ? ?PLEASE NOTE: ? ?If you had any lab tests please let us know if you have not heard back within a few days. You may see your results on mychart before we have a chance to review them but we will give you a call once they are reviewed by [place]. If we ordered any referrals today, please let us know if you have not heard from their office within the next week.  ? ?Please try these tips to maintain a healthy lifestyle: ? ?Eat at least 3 REAL meals and 1-2 snacks per day.  Aim for no more than 5 hours between eating.  If you eat breakfast, please do so within one hour of getting up.  ? ?Each meal should contain half fruits/vegetables, one quarter protein, and one quarter carbs (no bigger than a computer mouse) ? ?Cut down on sweet beverages. This includes juice, soda, and sweet tea.  ? ?Drink at least 1 glass of water with each meal and aim for at least 8 glasses per day ? ?Exercise at least 150 minutes every week.   ? ?Preventive Care 72 Years and Older, Male ?Preventive care refers to lifestyle choices and visits with your health care provider that can promote health and wellness. Preventive care visits are also called wellness exams. ?What can I expect for my preventive care visit? ?Counseling ?During your preventive care visit, your health care provider may ask about your: ?Medical history, including: ?Past medical problems. ?Family medical history. ?History of falls. ?Current health, including: ?Emotional well-being. ?Home life and relationship well-being. ?Sexual activity. ?Memory and ability to understand (cognition). ?Lifestyle, including: ?Alcohol, nicotine or tobacco, and drug use. ?Access to firearms. ?Diet, exercise, and sleep habits. ?Work and  work Statistician. ?Sunscreen use. ?Safety issues such as seatbelt and bike helmet use. ?Physical exam ?Your health care provider will check your: ?Height and weight. These may be used to calculate your BMI (body mass index). BMI is a measurement that tells if you are at a healthy weight. ?Waist circumference. This measures the distance around your waistline. This measurement also tells if you are at a healthy weight and may help predict your risk of certain diseases, such as type 2 diabetes and high blood pressure. ?Heart rate and blood pressure. ?Body temperature. ?Skin for abnormal spots. ?What immunizations do I need? ?Vaccines are usually given at various ages, according to a schedule. Your health care provider will recommend vaccines for you based on your age, medical history, and lifestyle or other factors, such as travel or where you work. ?What tests do I need? ?Screening ?Your health care provider may recommend screening tests for certain conditions. This may include: ?Lipid and cholesterol levels. ?Diabetes screening. This is done by checking your blood sugar (glucose) after you have not eaten for a while (fasting). ?Hepatitis C test. ?Hepatitis B test. ?HIV (human immunodeficiency virus) test. ?STI (sexually transmitted infection) testing, if you are at risk. ?Lung cancer screening. ?Colorectal cancer screening. ?Prostate cancer screening. ?Abdominal aortic aneurysm (AAA) screening. You may need this if you are a current or former smoker. ?Talk with your health care provider about your test results, treatment options, and if necessary, the need for  more tests. ?Follow these instructions at home: ?Eating and drinking ? ?Eat a diet that includes fresh fruits and vegetables, whole grains, lean protein, and low-fat dairy products. Limit your intake of foods with high amounts of sugar, saturated fats, and salt. ?Take vitamin and mineral supplements as recommended by your health care provider. ?Do not drink  alcohol if your health care provider tells you not to drink. ?If you drink alcohol: ?Limit how much you have to 0-2 drinks a day. ?Know how much alcohol is in your drink. In the [place] one drink equals one 12 oz bottle of beer (355 mL), one 5 oz glass of wine (148 mL), or one 1? oz glass of hard liquor (44 mL). ?Lifestyle ?Brush your teeth every morning and night with fluoride toothpaste. Floss one time each day. ?Exercise for at least 30 minutes 5 or more days each week. ?Do not use any products that contain nicotine or tobacco. These products include cigarettes, chewing tobacco, and vaping devices, such as e-cigarettes. If you need help quitting, ask your health care provider. ?Do not use drugs. ?If you are sexually active, practice safe sex. Use a condom or other form of protection to prevent STIs. ?Take aspirin only as told by your health care provider. Make sure that you understand how much to take and what form to take. Work with your health care provider to find out whether it is safe and beneficial for you to take aspirin daily. ?Ask your health care provider if you need to take a cholesterol-lowering medicine (statin). ?Find healthy ways to manage stress, such as: ?Meditation, yoga, or listening to music. ?Journaling. ?Talking to a trusted person. ?Spending time with friends and family. ?Safety ?Always wear your seat belt while driving or riding in a vehicle. ?Do not drive: ?If you have been drinking alcohol. Do not ride with someone who has been drinking. ?When you are tired or distracted. ?While texting. ?If you have been using any mind-altering substances or drugs. ?Wear a helmet and other protective equipment during sports activities. ?If you have firearms in your house, make sure you follow all gun safety procedures. ?Minimize exposure to UV radiation to reduce your risk of skin cancer. ?What's next? ?Visit your health care provider once a year for an annual wellness visit. ?Ask your health care  provider how often you should have your eyes and teeth checked. ?Stay up to date on all vaccines. ?This information is not intended to replace advice given to you by your health care provider. Make sure you discuss any questions you have with your health care provider. ?Document Revised: 07/23/2020 Document Reviewed: 07/23/2020 ?Elsevier Patient Education ? [place]. ? ?

## 2021-04-09 NOTE — Assessment & Plan Note (Signed)
Check A1c.  Continue lifestyle modifications. 

## 2021-04-09 NOTE — Assessment & Plan Note (Signed)
Continue Prilosec 40 mg daily.

## 2021-04-09 NOTE — Assessment & Plan Note (Signed)
Check labs.  Continue Lipitor 20 mg daily. 

## 2021-04-10 NOTE — Progress Notes (Signed)
Please inform patient of the following: ? ?Cholesterol is up a bit since last time.  Recommend increasing Lipitor to 40 mg daily.  We can recheck in 6 to 12 months.  Please send in a prescription if he is agreeable.  He should continue to work on diet and exercise. ? ?Blood sugar is borderline elevated but stable.  Do not need to make any medication changes regarding this.  We can recheck in 6 to 12 months. ? ?Everything else is stable. ?

## 2021-04-13 ENCOUNTER — Other Ambulatory Visit: Payer: Self-pay | Admitting: *Deleted

## 2021-04-13 DIAGNOSIS — E782 Mixed hyperlipidemia: Secondary | ICD-10-CM

## 2021-04-13 MED ORDER — ATORVASTATIN CALCIUM 20 MG PO TABS: 40.0000 mg | ORAL_TABLET | Freq: Every day | ORAL | 3 refills | Status: DC

## 2021-04-16 ENCOUNTER — Ambulatory Visit: Payer: Medicare HMO

## 2021-04-17 ENCOUNTER — Ambulatory Visit
Admission: RE | Admit: 2021-04-17 | Discharge: 2021-04-17 | Disposition: A | Discharge status: Home / Self Care | Payer: Medicare HMO | Source: Ambulatory Visit | Attending: Family Medicine | Admitting: Family Medicine

## 2021-04-17 DIAGNOSIS — Z136 Encounter for screening for cardiovascular disorders: Secondary | ICD-10-CM

## 2021-04-17 DIAGNOSIS — Z8279 Family history of other congenital malformations, deformations and chromosomal abnormalities: Secondary | ICD-10-CM | POA: Diagnosis not present

## 2021-04-17 DIAGNOSIS — Z0001 Encounter for general adult medical examination with abnormal findings: Secondary | ICD-10-CM

## 2021-04-21 ENCOUNTER — Encounter: Payer: Self-pay | Admitting: Family Medicine

## 2021-04-21 DIAGNOSIS — K8020 Calculus of gallbladder without cholecystitis without obstruction: Secondary | ICD-10-CM | POA: Insufficient documentation

## 2021-04-21 NOTE — Progress Notes (Signed)
Please inform patient of the following: ? ?His scan was negative for any aneurysms.  ? ?He does have some calcification in his arteries but this is common and expected with his age. ? ?They found a gall stone. This should not cause any issues and we do not need to do any further testing but he should let us know if he starts to have any right upper quadrant abdominal pain.

## 2021-05-07 ENCOUNTER — Ambulatory Visit: Payer: Medicare HMO

## 2021-05-07 ENCOUNTER — Encounter (HOSPITAL_COMMUNITY): Payer: Medicare HMO

## 2021-05-10 ENCOUNTER — Other Ambulatory Visit: Payer: Self-pay

## 2021-05-10 DIAGNOSIS — I6523 Occlusion and stenosis of bilateral carotid arteries: Secondary | ICD-10-CM

## 2021-05-18 NOTE — Progress Notes (Signed)
?HISTORY AND PHYSICAL  ? ? ? ?CC:  follow up. ?Requesting Provider:  Vivi Barrack, MD ? ?HPI: This is a 76 y.o. male here for follow up for carotid artery stenosis.  Pt is s/p right CEA for symptomatic carotid artery stenosis on 08/03/2011 by Dr. Scot Dock.   ? ?Pt was last seen 04/30/2020 and at that time he was dong having any neurological sx.   ? ?Pt returns today for follow up.  His brother was recently diagnosed with AAA and he is also requesting AAA screening due to this.  He tells me he had gone to his PCP and he was sent for his AAA screening and this was negative.   ? ?Pt denies any amaurosis fugax, speech difficulties, weakness, numbness, paralysis or clumsiness or facial droop.   ? ?He is compliant with his asa/statin ? ?The pt is on a statin for cholesterol management.  ?The pt is not on a daily aspirin.   Other AC:  none ?The pt is not on medication for hypertension.   ?The pt is not diabetic.   ?Tobacco hx:  never ? ?Pt does have family hx of AAA. ? ?Past Medical History:  ?Diagnosis Date  ? Arthritis of foot   ? Carotid artery occlusion   ? Cataract   ? Colon polyps   ? adenomatous  ? Diverticulosis   ? Esophageal stricture   ? GERD (gastroesophageal reflux disease)   ? erosive esophagitis  ? Hypercholesterolemia   ? Hypertension   ? Lactose intolerance   ? Sarcoidosis 09/20/2007  ? Qualifier: Diagnosis of  By: Lenn Cal Deborra Medina), Wynona Canes    ? Skin cancer   ? skin CA-2019  ? Stroke [facility]) June 2013  ? Mini   ? ? ?Past Surgical History:  ?Procedure Laterality Date  ? CAROTID ENDARTERECTOMY Right August 03, 2011  ? CE  ? COLONOSCOPY    ? [place], normal per pt  ? ENDARTERECTOMY  08/03/2011  ? Procedure: ENDARTERECTOMY CAROTID;  Surgeon: Angelia Mould, MD;  Location: [facility] OR;  Service: Vascular;  Laterality: Right;  ? LUMBAR LAMINECTOMY/DECOMPRESSION MICRODISCECTOMY Bilateral 02/23/2014  ? Procedure: BILATERAL LUMBAR THREE-FOUR LAMINOTOMY AND MICRODISCECTOMY WITH RIGHT LUMBAR FOUR-FIVE  LAMINOTOMY AND MICRODISCECTOMY;  Surgeon: Hosie Spangle, MD;  Location: [place];  Service: Neurosurgery;  Laterality: Bilateral;  Bilateral L34 laminotomy with Left L34 microdiskectomy and possible Right L34 microdiskectomy and Right L45 Laminotomy and foraminotomy  ? MOHS SURGERY    ? to face  ? TONSILLECTOMY    ? TRANSFORAMINAL LUMBAR INTERBODY FUSION (TLIF) WITH PEDICLE SCREW FIXATION 1 LEVEL Right 10/10/2019  ? Procedure: LEFT-SIDED LUMBAR TWO - LUMBAR THREE TRANSFORAMINAL LUMBAR INTERBODY FUSION WITH INSTRUMENTATION AND ALLOGRAFT;  Surgeon: Phylliss Bob, MD;  Location: [place];  Service: Orthopedics;  Laterality: Right;  LEFT-SIDED LUMBAR TWO - LUMBAR THREE TRANSFORAMINAL LUMBAR INTERBODY FUSION WITH INSTRUMENTATION AND ALLOGRAFT  ? ? ?Allergies  ?Allergen Reactions  ? Morphine And Related Nausea And Vomiting  ?  Gi upset  ? Other   ?  Bee sting - swelling in the areas stung  ? ? ?Current Outpatient Medications  ?Medication Sig Dispense Refill  ? atorvastatin (LIPITOR) 20 MG tablet Take 2 tablets (40 mg total) by mouth daily. 90 tablet 3  ? omeprazole (PRILOSEC) 40 MG capsule Take 1 capsule (40 mg total) by mouth daily. 90 capsule 3  ? ?No current facility-administered medications for this visit.  ? ? ?Family History  ?Problem Relation Age of  Onset  ? Lupus Mother   ? Stroke Mother   ? Diabetes Sister   ? Lupus Sister   ? Colon cancer Neg Hx   ? Colon polyps Neg Hx   ? Rectal cancer Neg Hx   ? Stomach cancer Neg Hx   ? Esophageal cancer Neg Hx   ? Pancreatic cancer Neg Hx   ? Liver disease Neg Hx   ? Kidney disease Neg Hx   ? ? ?Social History  ? ?Socioeconomic History  ? Marital status: Married  ?  Spouse name: Not on file  ? Number of children: Not on file  ? Years of education: Not on file  ? Highest education level: Not on file  ?Occupational History  ? Not on file  ?Tobacco Use  ? Smoking status: Never  ? Smokeless tobacco: Never  ?Vaping Use  ? Vaping Use: Never used  ?Substance and Sexual Activity  ?  Alcohol use: Yes  ?  Alcohol/week: 0.0 standard drinks  ?  Comment: 1-2 drinks on weekends    ? Drug use: No  ? Sexual activity: Not on file  ?Other Topics Concern  ? Not on file  ?Social History Narrative  ? ** Merged History Encounter **  ?    ? 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  Finished high school.  Can read and write.  Hasn't seen a doctor in many years.  ? ?Social Determinants of Health  ? ?Financial Resource Strain: Low Risk   ? Difficulty of Paying Living Expenses: Not hard at all  ?Food Insecurity: No Food Insecurity  ? Worried About Charity fundraiser in the Last Year: Never true  ? Ran Out of Food in the Last Year: Never true  ?Transportation Needs: No Transportation Needs  ? Lack of Transportation (Medical): No  ? Lack of Transportation (Non-Medical): No  ?Physical Activity: Sufficiently Active  ? Days of Exercise per Week: 3 days  ? Minutes of Exercise per Session: 60 min  ?Stress: No Stress Concern Present  ? Feeling of Stress : Not at all  ?Social Connections: Moderately Integrated  ? Frequency of Communication with Friends and Family: Once a week  ? Frequency of Social Gatherings with Friends and Family: More than three times a week  ? Attends Religious Services: More than 4 times per year  ? Active Member of Clubs or Organizations: No  ? Attends Archivist Meetings: Never  ? Marital Status: Married  ?Intimate Partner Violence: Not At Risk  ? Fear of Current or Ex-Partner: No  ? Emotionally Abused: No  ? Physically Abused: No  ? Sexually Abused: No  ? ? ? ?REVIEW OF SYSTEMS:  ? ?'[X]'$  denotes positive finding, '[ ]'$  denotes negative finding ?Cardiac  Comments:  ?Chest pain or chest pressure:    ?Shortness of breath upon exertion:    ?Short of breath when lying flat:    ?Irregular heart rhythm:    ?    ?Vascular    ?Pain in calf, thigh, or hip brought on by ambulation:    ?Pain in feet at night that wakes you up from your sleep:     ?Blood clot in  your veins:    ?Leg swelling:     ?    ?Pulmonary    ?Oxygen at home:    ?Productive cough:     ?Wheezing:     ?    ?Neurologic    ?Sudden weakness  in arms or legs:     ?Sudden numbness in arms or legs:     ?Sudden onset of difficulty speaking or slurred speech:    ?Temporary loss of vision in one eye:     ?Problems with dizziness:     ?    ?Gastrointestinal    ?Blood in stool:     ?Vomited blood:     ?    ?Genitourinary    ?Burning when urinating:     ?Blood in urine:    ?    ?Psychiatric    ?Major depression:     ?    ?Hematologic    ?Bleeding problems:    ?Problems with blood clotting too easily:    ?    ?Skin    ?Rashes or ulcers:    ?    ?Constitutional    ?Fever or chills:    ? ? ?PHYSICAL EXAMINATION: ? ?Today's Vitals  ? 05/21/21 1005 05/21/21 1008  ?BP: (!) 164/76 (!) 167/79  ?Pulse: 61 (!) 56  ?Temp: (!) 97.4 ?F (36.3 ?C)   ?Weight: 202 lb 1.6 oz (91.7 kg)   ?Height: '6\' 1"'$  (1.854 m)   ? ?Body mass index is 26.66 kg/m?. ? ? ?General:  WDWN in NAD; vital signs documented above ?Gait: Not observed ?HENT: WNL, normocephalic ?Pulmonary: normal non-labored breathing ?Cardiac: regular HR, without carotid bruits ?Abdomen: soft, NT; aortic pulse is not palpable ?Skin: without rashes ?Vascular Exam/Pulses: ? Right Left  ?Radial 2+ (normal) 2+ (normal)  ?AT 2+ (normal) 2+ (normal)  ? ?Extremities: without ischemic changes, without Gangrene , without cellulitis; without open wounds ?Musculoskeletal: no muscle wasting or atrophy  ?Neurologic: A&O X 3; moving all extremities equally; speech is fluent/normal ?Psychiatric:  The pt has Normal affect. ? ? ?Non-Invasive Vascular Imaging:   ?Carotid Duplex on 05/21/2021: ?Right:  1-39% ICA stenosis ?Left:  40-59% ICA stenosis ?Vertebrals:  Bilateral vertebral arteries demonstrate antegrade flow.  ?Subclavians: Normal flow hemodynamics were seen in bilateral subclavian  arteries.  ? ? ?Previous Carotid duplex on 04/30/2020: ?Right: 1-39% ICA stenosis ?Left:   40-59% ICA  stenosis ? ? ? ?ASSESSMENT/PLAN:: 76 y.o. male here for follow up carotid artery stenosis and is s/p right CEA for symptomatic carotid artery stenosis on 08/03/2011 by Dr. Scot Dock.   ? ?Carotid stenosis ?-duplex to

## 2021-05-21 ENCOUNTER — Ambulatory Visit (HOSPITAL_COMMUNITY)
Admission: RE | Admit: 2021-05-21 | Discharge: 2021-05-21 | Disposition: A | Discharge status: Home / Self Care | Payer: Medicare HMO | Source: Ambulatory Visit | Attending: Vascular Surgery | Admitting: Vascular Surgery

## 2021-05-21 ENCOUNTER — Ambulatory Visit: Payer: Medicare HMO | Admitting: Physician Assistant

## 2021-05-21 ENCOUNTER — Other Ambulatory Visit (HOSPITAL_COMMUNITY): Payer: Medicare HMO

## 2021-05-21 VITALS — BP 167/79 | HR 56 | Temp 97.4°F | Ht 73.0 in | Wt 202.1 lb

## 2021-05-21 DIAGNOSIS — I6523 Occlusion and stenosis of bilateral carotid arteries: Secondary | ICD-10-CM

## 2021-05-27 IMAGING — DX DG LUMBAR SPINE 2-3V
3 series · 3 of 3 positions shown · non-contrast
Comparison: March 18, 2014

CLINICAL DATA: Bilateral hip and lower back pain x9 months.

EXAM:
LUMBAR SPINE - 2-3 VIEW

[lumbar spine ap]
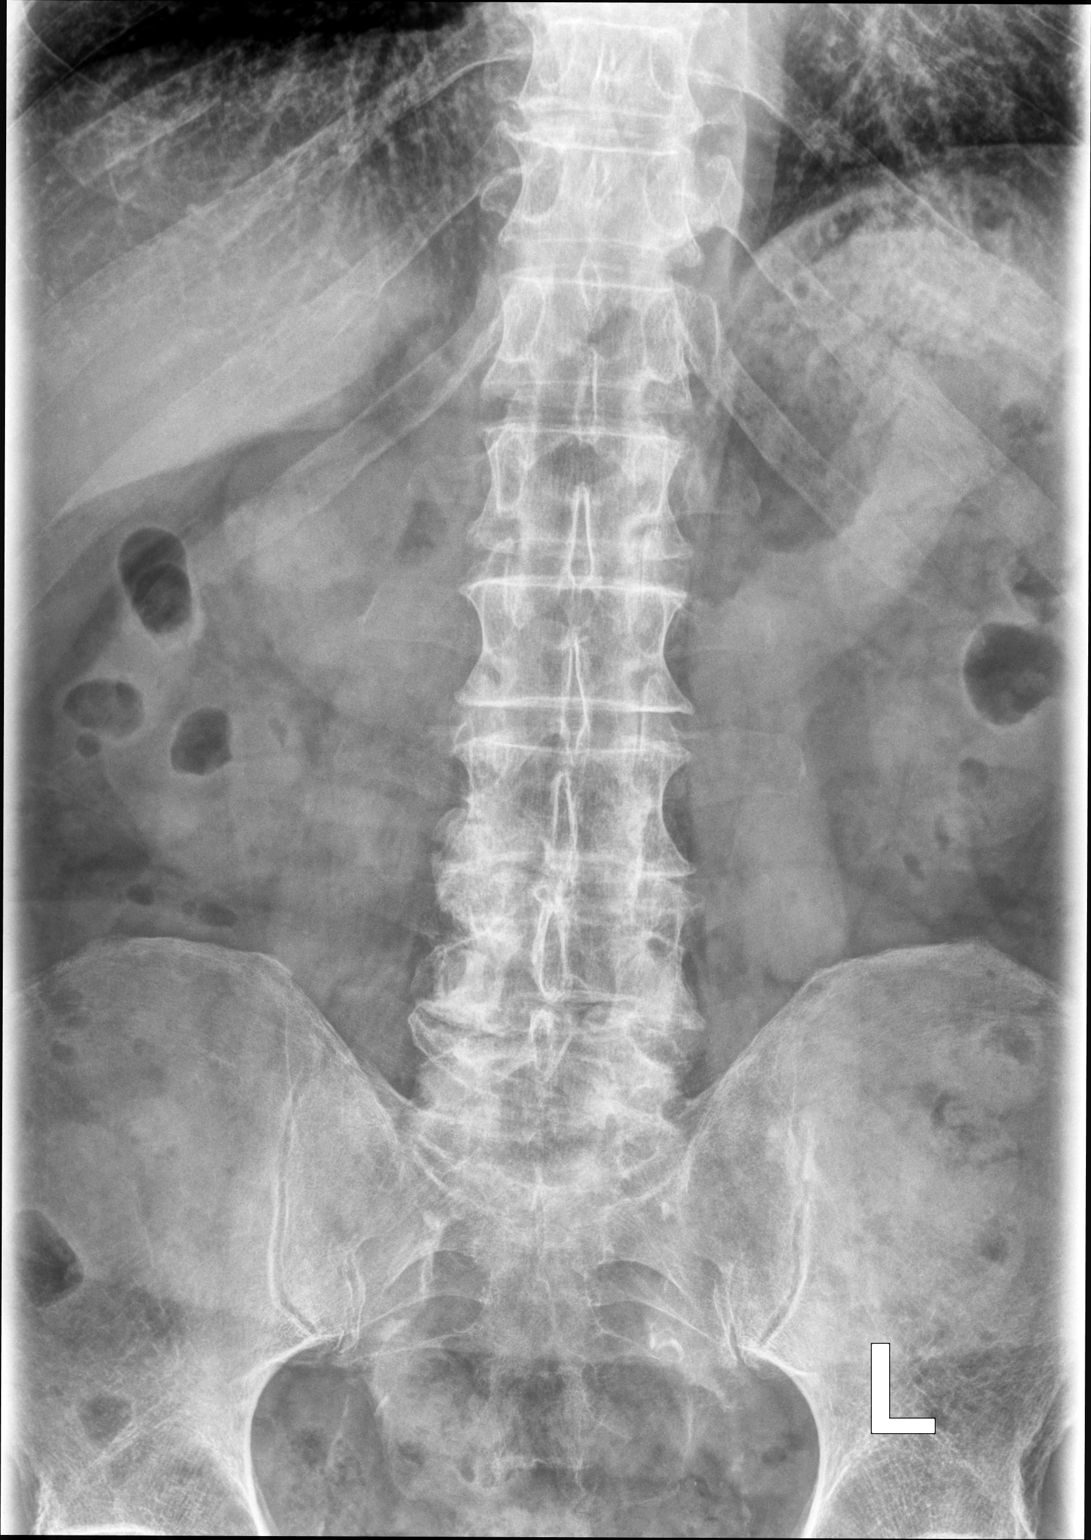

[lumbar spine lat (1 of 2)]
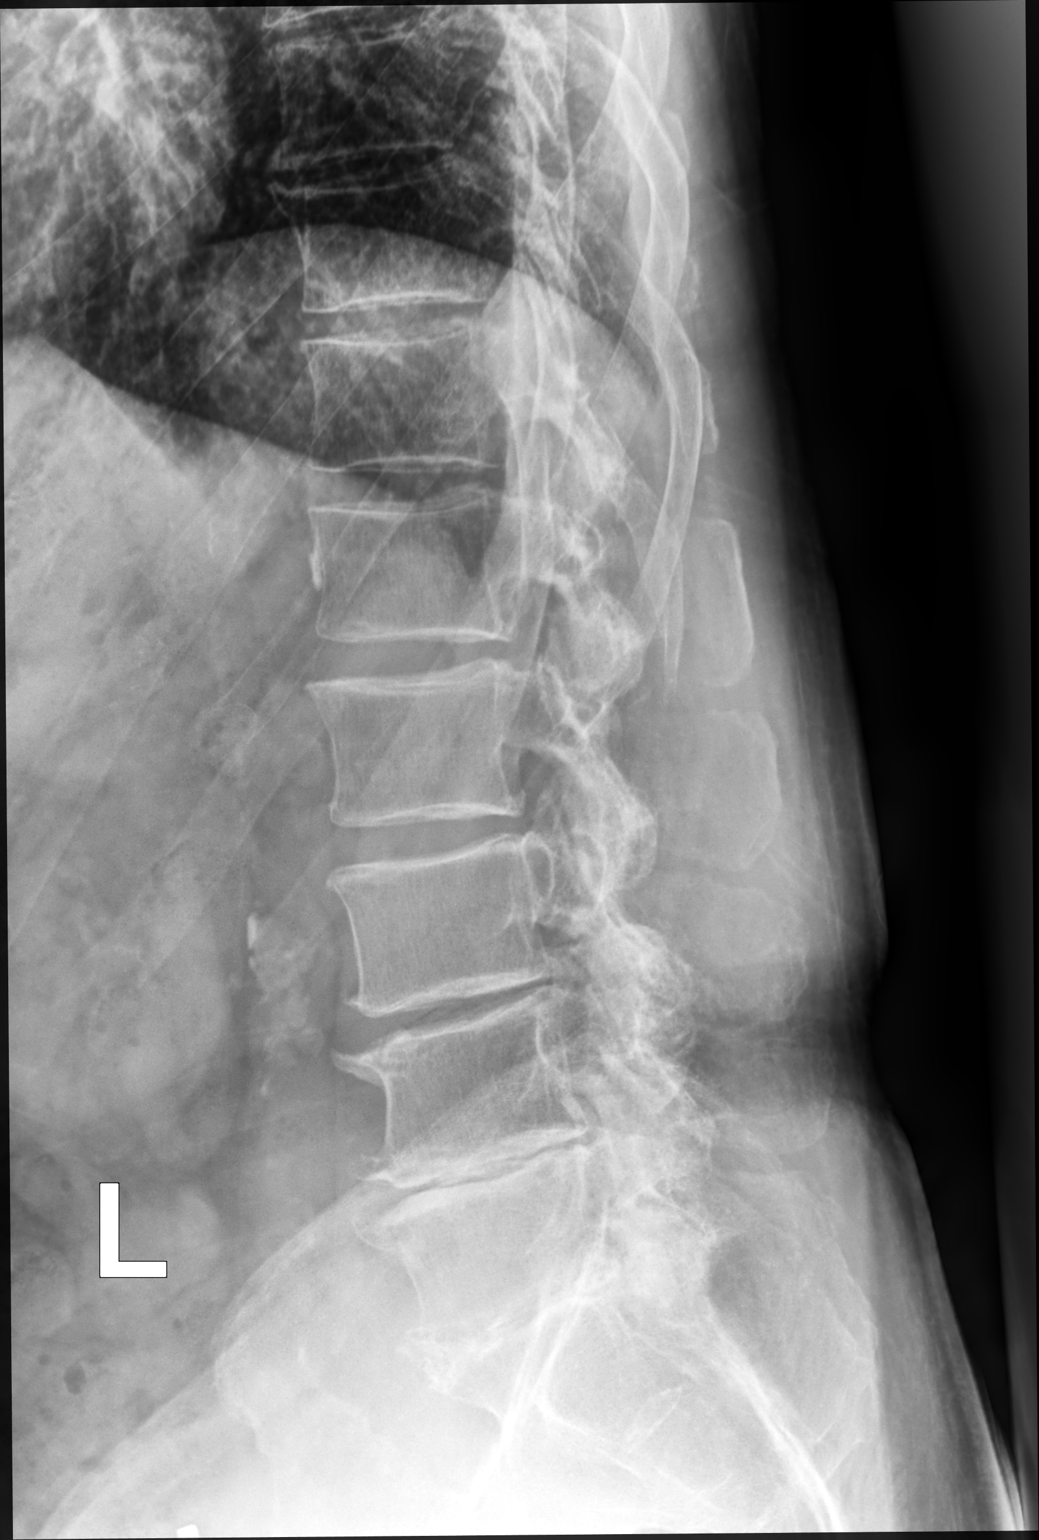

[lumbar spine lat (2 of 2)]
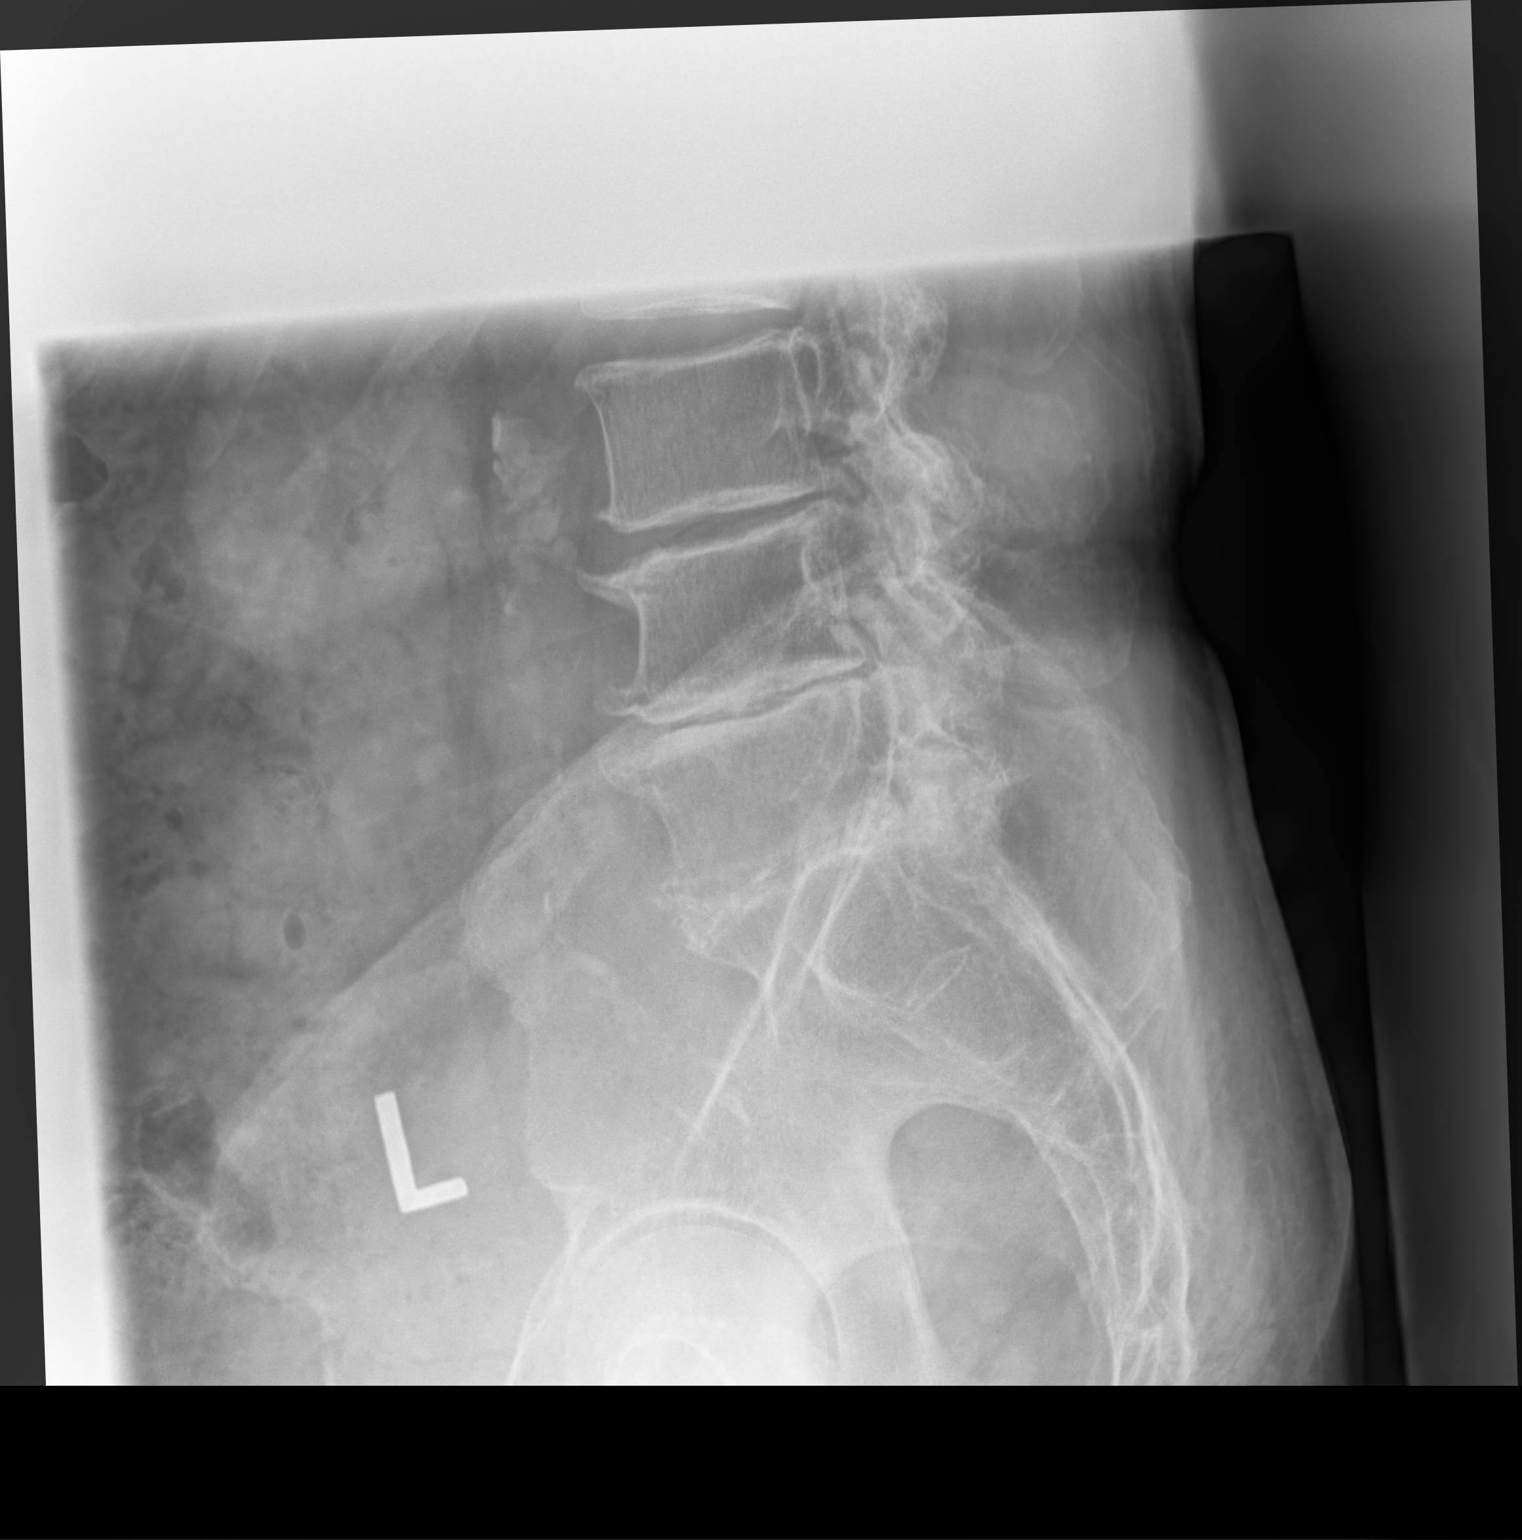

[3 of 3 positions shown; findings below may reference images not displayed]

FINDINGS: There is no evidence of acute lumbar spine fracture. Alignment is
normal. Moderate to marked severity endplate sclerosis is seen at
the levels of L3-L4, L4-L5 and L5-S1. Marked severity intervertebral
disc space narrowing is seen at the levels of L4-L5 and L5-S1 with
moderate severity intervertebral disc space narrowing noted at the
level of the L3-L4. There is moderate severity calcification of the
abdominal aorta.
IMPRESSION: 1. No evidence of acute lumbar spine fracture or malalignment.
2. Advanced degenerative disc disease in the lower lumbar spine.

## 2021-07-07 DIAGNOSIS — Z85828 Personal history of other malignant neoplasm of skin: Secondary | ICD-10-CM | POA: Diagnosis not present

## 2021-07-07 DIAGNOSIS — L570 Actinic keratosis: Secondary | ICD-10-CM | POA: Diagnosis not present

## 2021-07-07 DIAGNOSIS — L820 Inflamed seborrheic keratosis: Secondary | ICD-10-CM | POA: Diagnosis not present

## 2021-07-07 DIAGNOSIS — D485 Neoplasm of uncertain behavior of skin: Secondary | ICD-10-CM | POA: Diagnosis not present

## 2021-07-07 DIAGNOSIS — L821 Other seborrheic keratosis: Secondary | ICD-10-CM | POA: Diagnosis not present

## 2021-07-13 ENCOUNTER — Other Ambulatory Visit: Payer: Self-pay | Admitting: Family Medicine

## 2021-07-13 DIAGNOSIS — K2100 Gastro-esophageal reflux disease with esophagitis, without bleeding: Secondary | ICD-10-CM

## 2021-07-13 DIAGNOSIS — R1314 Dysphagia, pharyngoesophageal phase: Secondary | ICD-10-CM

## 2021-07-13 DIAGNOSIS — K222 Esophageal obstruction: Secondary | ICD-10-CM

## 2021-07-13 DIAGNOSIS — K253 Acute gastric ulcer without hemorrhage or perforation: Secondary | ICD-10-CM

## 2021-07-16 ENCOUNTER — Other Ambulatory Visit: Payer: Self-pay | Admitting: *Deleted

## 2021-07-16 DIAGNOSIS — E782 Mixed hyperlipidemia: Secondary | ICD-10-CM

## 2021-07-16 MED ORDER — ATORVASTATIN CALCIUM 40 MG PO TABS: 40.0000 mg | ORAL_TABLET | Freq: Every day | ORAL | 3 refills | Status: DC

## 2021-11-02 ENCOUNTER — Encounter: Payer: Self-pay | Admitting: *Deleted

## 2021-11-30 DIAGNOSIS — Z23 Encounter for immunization: Secondary | ICD-10-CM | POA: Diagnosis not present

## 2022-01-08 DIAGNOSIS — H52209 Unspecified astigmatism, unspecified eye: Secondary | ICD-10-CM | POA: Diagnosis not present

## 2022-01-08 DIAGNOSIS — H5203 Hypermetropia, bilateral: Secondary | ICD-10-CM | POA: Diagnosis not present

## 2022-01-08 DIAGNOSIS — H524 Presbyopia: Secondary | ICD-10-CM | POA: Diagnosis not present

## 2022-01-18 DIAGNOSIS — L570 Actinic keratosis: Secondary | ICD-10-CM | POA: Diagnosis not present

## 2022-01-18 DIAGNOSIS — L821 Other seborrheic keratosis: Secondary | ICD-10-CM | POA: Diagnosis not present

## 2022-01-18 DIAGNOSIS — Z85828 Personal history of other malignant neoplasm of skin: Secondary | ICD-10-CM | POA: Diagnosis not present

## 2022-02-17 ENCOUNTER — Telehealth: Payer: Self-pay | Admitting: Family Medicine

## 2022-02-17 NOTE — Telephone Encounter (Signed)
Copied from [place] [id number]. Topic: Medicare AWV >> Feb 17, 2022 12:04 PM Gillis Santa wrote: Reason for CRM: LVM REMINDER OF AWV IN PERSON APPT FOR 02/18/2022 [place]

## 2022-02-18 ENCOUNTER — Ambulatory Visit (INDEPENDENT_AMBULATORY_CARE_PROVIDER_SITE_OTHER): Payer: Medicare HMO

## 2022-02-18 ENCOUNTER — Telehealth: Payer: Self-pay | Admitting: Family Medicine

## 2022-02-18 VITALS — BP 138/72 | HR 62 | Temp 98.0°F | Wt 195.2 lb

## 2022-02-18 DIAGNOSIS — Z0000 Encounter for general adult medical examination without abnormal findings: Secondary | ICD-10-CM | POA: Diagnosis not present

## 2022-02-18 NOTE — Telephone Encounter (Signed)
Pt would like to do his labs 1 week prior to his physical on 04/15/22. Once orders are placed, we will schedule pt for lab.

## 2022-02-18 NOTE — Patient Instructions (Signed)
[name] , Thank you for taking time to come for your Medicare Wellness Visit. I appreciate your ongoing commitment to your health goals. Please review the following plan we discussed and let me know if I can assist you in the future.   These are the goals we discussed:  Goals      Patient Stated     Keeping labs under control with exercise      Patient Stated     None at this time         This is a list of the screening recommended for you and due dates:  Health Maintenance  Topic Date Due   Hepatitis C Screening: USPSTF Recommendation to screen - Ages 63-79 yo.  Never done   DTaP/Tdap/Td vaccine (2 - Td or Tdap) 10/25/2021   Medicare Annual Wellness Visit  02/19/2023   Colon Cancer Screening  09/03/2024   Pneumonia Vaccine  Completed   Flu Shot  Completed   COVID-19 Vaccine  Completed   Zoster (Shingles) Vaccine  Completed   HPV Vaccine  Aged Out    Advanced directives: Please bring a copy of your health care power of attorney and living will to the office at your convenience.  Conditions/risks identified: none at this time   Next appointment: Follow up in one year for your annual wellness visit.   Preventive Care 25 Years and Older, Male  Preventive care refers to lifestyle choices and visits with your health care provider that can promote health and wellness. What does preventive care include? A yearly physical exam. This is also called an annual well check. Dental exams once or twice a year. Routine eye exams. Ask your health care provider how often you should have your eyes checked. Personal lifestyle choices, including: Daily care of your teeth and gums. Regular physical activity. Eating a healthy diet. Avoiding tobacco and drug use. Limiting alcohol use. Practicing safe sex. Taking low doses of aspirin every day. Taking vitamin and mineral supplements as recommended by your health care provider. What happens during an annual well check? The services and  screenings done by your health care provider during your annual well check will depend on your age, overall health, lifestyle risk factors, and family history of disease. Counseling  Your health care provider may ask you questions about your: Alcohol use. Tobacco use. Drug use. Emotional well-being. Home and relationship well-being. Sexual activity. Eating habits. History of falls. Memory and ability to understand (cognition). Work and work Statistician. Screening  You may have the following tests or measurements: Height, weight, and BMI. Blood pressure. Lipid and cholesterol levels. These may be checked every 5 years, or more frequently if you are over 85 years old. Skin check. Lung cancer screening. You may have this screening every year starting at age 43 if you have a 30-pack-year history of smoking and currently smoke or have quit within the past 15 years. Fecal occult blood test (FOBT) of the stool. You may have this test every year starting at age 30. Flexible sigmoidoscopy or colonoscopy. You may have a sigmoidoscopy every 5 years or a colonoscopy every 10 years starting at age 51. Prostate cancer screening. Recommendations will vary depending on your family history and other risks. Hepatitis C blood test. Hepatitis B blood test. Sexually transmitted disease (STD) testing. Diabetes screening. This is done by checking your blood sugar (glucose) after you have not eaten for a while (fasting). You may have this done every 1-3 years. Abdominal aortic aneurysm (AAA)  screening. You may need this if you are a current or former smoker. Osteoporosis. You may be screened starting at age 90 if you are at high risk. Talk with your health care provider about your test results, treatment options, and if necessary, the need for more tests. Vaccines  Your health care provider may recommend certain vaccines, such as: Influenza vaccine. This is recommended every year. Tetanus, diphtheria, and  acellular pertussis (Tdap, Td) vaccine. You may need a Td booster every 10 years. Zoster vaccine. You may need this after age 76. Pneumococcal 13-valent conjugate (PCV13) vaccine. One dose is recommended after age 87. Pneumococcal polysaccharide (PPSV23) vaccine. One dose is recommended after age 71. Talk to your health care provider about which screenings and vaccines you need and how often you need them. This information is not intended to replace advice given to you by your health care provider. Make sure you discuss any questions you have with your health care provider. Document Released: 02/21/2015 Document Revised: 10/15/2015 Document Reviewed: 11/26/2014 Elsevier Interactive Patient Education  2017 [employer] Prevention in the Home Falls can cause injuries. They can happen to people of all ages. There are many things you can do to make your home safe and to help prevent falls. What can I do on the outside of my home? Regularly fix the edges of walkways and driveways and fix any cracks. Remove anything that might make you trip as you walk through a door, such as a raised step or threshold. Trim any bushes or trees on the path to your home. Use bright outdoor lighting. Clear any walking paths of anything that might make someone trip, such as rocks or tools. Regularly check to see if handrails are loose or broken. Make sure that both sides of any steps have handrails. Any raised decks and porches should have guardrails on the edges. Have any leaves, snow, or ice cleared regularly. Use sand or salt on walking paths during winter. Clean up any spills in your garage right away. This includes oil or grease spills. What can I do in the bathroom? Use night lights. Install grab bars by the toilet and in the tub and shower. Do not use towel bars as grab bars. Use non-skid mats or decals in the tub or shower. If you need to sit down in the shower, use a plastic, non-slip stool. Keep  the floor dry. Clean up any water that spills on the floor as soon as it happens. Remove soap buildup in the tub or shower regularly. Attach bath mats securely with double-sided non-slip rug tape. Do not have throw rugs and other things on the floor that can make you trip. What can I do in the bedroom? Use night lights. Make sure that you have a light by your bed that is easy to reach. Do not use any sheets or blankets that are too big for your bed. They should not hang down onto the floor. Have a firm chair that has side arms. You can use this for support while you get dressed. Do not have throw rugs and other things on the floor that can make you trip. What can I do in the kitchen? Clean up any spills right away. Avoid walking on wet floors. Keep items that you use a lot in easy-to-reach places. If you need to reach something above you, use a strong step stool that has a grab bar. Keep electrical cords out of the way. Do not use floor polish or  wax that makes floors slippery. If you must use wax, use non-skid floor wax. Do not have throw rugs and other things on the floor that can make you trip. What can I do with my stairs? Do not leave any items on the stairs. Make sure that there are handrails on both sides of the stairs and use them. Fix handrails that are broken or loose. Make sure that handrails are as long as the stairways. Check any carpeting to make sure that it is firmly attached to the stairs. Fix any carpet that is loose or worn. Avoid having throw rugs at the top or bottom of the stairs. If you do have throw rugs, attach them to the floor with carpet tape. Make sure that you have a light switch at the top of the stairs and the bottom of the stairs. If you do not have them, ask someone to add them for you. What else can I do to help prevent falls? Wear shoes that: Do not have high heels. Have rubber bottoms. Are comfortable and fit you well. Are closed at the toe. Do not  wear sandals. If you use a stepladder: Make sure that it is fully opened. Do not climb a closed stepladder. Make sure that both sides of the stepladder are locked into place. Ask someone to hold it for you, if possible. Clearly mark and make sure that you can see: Any grab bars or handrails. First and last steps. Where the edge of each step is. Use tools that help you move around (mobility aids) if they are needed. These include: Canes. Walkers. Scooters. Crutches. Turn on the lights when you go into a dark area. Replace any light bulbs as soon as they burn out. Set up your furniture so you have a clear path. Avoid moving your furniture around. If any of your floors are uneven, fix them. If there are any pets around you, be aware of where they are. Review your medicines with your doctor. Some medicines can make you feel dizzy. This can increase your chance of falling. Ask your doctor what other things that you can do to help prevent falls. This information is not intended to replace advice given to you by your health care provider. Make sure you discuss any questions you have with your health care provider. Document Released: 11/21/2008 Document Revised: 07/03/2015 Document Reviewed: 03/01/2014 Elsevier Interactive Patient Education  2017 [employer].

## 2022-02-18 NOTE — Telephone Encounter (Signed)
Ok to order labs? 

## 2022-02-18 NOTE — Telephone Encounter (Signed)
Ok with me. Please place any necessary orders. 

## 2022-02-18 NOTE — Progress Notes (Signed)
Subjective:   Eduardo Shaw is a 77 y.o. male who presents for Medicare Annual/Subsequent preventive examination.  Review of Systems     Cardiac Risk Factors include: advanced age (>31mn, >>38women);dyslipidemia;male gender     Objective:    Today's Vitals   02/18/22 1314  BP: (!) 140/70  Pulse: 62  Temp: 98 F (36.7 C)  SpO2: 95%  Weight: 195 lb 3.2 oz (88.5 kg)   Body mass index is 25.75 kg/m.     02/18/2022    1:19 PM 02/12/2021    1:19 PM 10/03/2019    1:42 PM 10/02/2018   12:11 PM 11/03/2016   12:11 PM 10/29/2015    1:22 PM 10/23/2014    1:07 PM  Advanced Directives  Does Patient Have a Medical Advance Directive? Yes Yes Yes Yes Yes  No  Type of AParamedicof AWeeping WaterLiving will Living will HMaxLiving will Living will;Healthcare Power of ALorimorwill   Does patient want to make changes to medical advance directive?   No - Patient declined No - Patient declined     Copy of [place] Chart? No - copy requested  No - copy requested No - copy requested No - copy requested    Would patient like information on creating a medical advance directive?       Yes - Educational materials given    Current Medications (verified) Outpatient Encounter Medications as of 02/18/2022  Medication Sig   aspirin 81 MG chewable tablet Chew by mouth daily.   atorvastatin (LIPITOR) 40 MG tablet Take 1 tablet (40 mg total) by mouth daily.   omeprazole (PRILOSEC) 40 MG capsule TAKE 1 CAPSULE (40 MG TOTAL) BY MOUTH DAILY.   No facility-administered encounter medications on file as of 02/18/2022.    Allergies (verified) Morphine and related and Other   History: Past Medical History:  Diagnosis Date   Arthritis of foot    Carotid artery occlusion    Cataract    Colon polyps    adenomatous   Diverticulosis    Esophageal stricture    GERD (gastroesophageal reflux disease)     erosive esophagitis   Hypercholesterolemia    Hypertension    Lactose intolerance    Sarcoidosis 09/20/2007   Qualifier: Diagnosis of  By: FLenn Cal(Deborra Medina, Susanne     Skin cancer    skin CA-2019   Stroke ([facility] June 2013   Mini    Past Surgical History:  Procedure Laterality Date   CAROTID ENDARTERECTOMY Right August 03, 2011   CE   [place]   ENDARTERECTOMY  08/03/2011   Procedure: ENDARTERECTOMY CAROTID;  Surgeon: CAngelia Mould MD;  [place]  Service: Vascular;  Laterality: Right;   LUMBAR LAMINECTOMY/DECOMPRESSION MICRODISCECTOMY Bilateral 02/23/2014   Procedure: BILATERAL LUMBAR THREE-FOUR LAMINOTOMY AND MICRODISCECTOMY WITH RIGHT LUMBAR FOUR-FIVE LAMINOTOMY AND MICRODISCECTOMY;  Surgeon: RHosie Spangle MD;  [place] [facility]  Service: Neurosurgery;  Laterality: Bilateral;  Bilateral L34 laminotomy with Left L34 microdiskectomy and possible Right L34 microdiskectomy and Right L45 Laminotomy and foraminotomy   MOHS SURGERY     to face   TONSILLECTOMY     TRANSFORAMINAL LUMBAR INTERBODY FUSION (TLIF) WITH PEDICLE SCREW FIXATION 1 LEVEL Right 10/10/2019   Procedure: LEFT-SIDED LUMBAR TWO - LUMBAR THREE TRANSFORAMINAL LUMBAR INTERBODY FUSION WITH INSTRUMENTATION AND ALLOGRAFT;  Surgeon: DPhylliss Bob  MD;  Location: [place];  Service: Orthopedics;  Laterality: Right;  LEFT-SIDED LUMBAR TWO - LUMBAR THREE TRANSFORAMINAL LUMBAR INTERBODY FUSION WITH INSTRUMENTATION AND ALLOGRAFT   Family History  Problem Relation Age of Onset   Lupus Mother    Stroke Mother    Diabetes Sister    Lupus Sister    Colon cancer Neg Hx    Colon polyps Neg Hx    Rectal cancer Neg Hx    Stomach cancer Neg Hx    Esophageal cancer Neg Hx    Pancreatic cancer Neg Hx    Liver disease Neg Hx    Kidney disease Neg Hx    Social History   Socioeconomic History   Marital status: Married    Spouse name: Not on file   Number of children: Not on  file   Years of education: Not on file   Highest education level: Not on file  Occupational History   Not on file  Tobacco Use   Smoking status: Never   Smokeless tobacco: Never  Vaping Use   Vaping Use: Never used  Substance and Sexual Activity   Alcohol use: Yes    Alcohol/week: 0.0 standard drinks of alcohol    Comment: 1-2 drinks on weekends     Drug use: No   Sexual activity: Not on file  Other Topics Concern   Not on file  Social History Narrative   ** Merged History Encounter **       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  Finished high school.  Can read and write.  Hasn't seen a doctor in many years.   Social Determinants of Health   Financial Resource Strain: Low Risk  (02/18/2022)   Overall Financial Resource Strain (CARDIA)    Difficulty of Paying Living Expenses: Not hard at all  Food Insecurity: No Food Insecurity (02/18/2022)   Hunger Vital Sign    Worried About Running Out of Food in the Last Year: Never true    Ran Out of Food in the Last Year: Never true  Transportation Needs: No Transportation Needs (02/18/2022)   PRAPARE - Hydrologist (Medical): No    Lack of Transportation (Non-Medical): No  Physical Activity: Sufficiently Active (02/18/2022)   Exercise Vital Sign    Days of Exercise per Week: 3 days    Minutes of Exercise per Session: 60 min  Stress: No Stress Concern Present (02/18/2022)   [place]    Feeling of Stress : Not at all  Social Connections: Moderately Integrated (02/18/2022)   Social Connection and Isolation Panel [NHANES]    Frequency of Communication with Friends and Family: Once a week    Frequency of Social Gatherings with Friends and Family: More than three times a week    Attends Religious Services: More than 4 times per year    Active Member of [employer] or Organizations: No    Attends Programme researcher, broadcasting/film/video: Never    Marital Status: Married    Tobacco Counseling Counseling given: Not Answered   Clinical Intake:  Pre-visit preparation completed: Yes  Pain : No/denies pain     BMI - recorded: 25.75 Nutritional Status: BMI 25 -29 Overweight Nutritional Risks: None Diabetes: No  How often do you need to have someone help you when you read instructions, pamphlets, or other written materials from your doctor or pharmacy?: 1 - Never  Diabetic?no  Interpreter Needed?: No  Information entered by :: Charlott Rakes, LPN   Activities of Daily Living    02/18/2022    1:20 PM  In your present state of health, do you have any difficulty performing the following activities:  Hearing? 0  Vision? 0  Difficulty concentrating or making decisions? 0  Walking or climbing stairs? 0  Dressing or bathing? 0  Doing errands, shopping? 0  Preparing Food and eating ? N  Using the Toilet? N  In the past six months, have you accidently leaked urine? N  Do you have problems with loss of bowel control? N  Managing your Medications? N  Managing your Finances? N  Housekeeping or managing your Housekeeping? N    Patient Care Team: Vivi Barrack, MD as PCP - General (Family Medicine) Calvert Cantor, MD as Consulting Physician (Ophthalmology) Elam Dutch, MD (Inactive) as Consulting Physician (Vascular Surgery)  Indicate any recent Medical Services you may have received from other than [facility] providers in the past year (date may be approximate).     Assessment:   This is a routine wellness examination for [place].  Hearing/Vision screen Hearing Screening - Comments:: Pt denies any hearing issues  Vision Screening - Comments:: Pt follows up with  opthalmology for annual eye exam  Dietary issues and exercise activities discussed: Current Exercise Habits: Home exercise routine, Type of exercise: walking, Time (Minutes): 60, Frequency (Times/Week): 3, Weekly  Exercise (Minutes/Week): 180   Goals Addressed             This Visit's Progress    Patient Stated       None at this time        Depression Screen    02/18/2022    1:18 PM 04/09/2021    1:29 PM 02/12/2021    1:18 PM 04/03/2020    1:20 PM 04/03/2019    1:45 PM 10/02/2018   12:12 PM 03/28/2018    2:00 PM  PHQ 2/9 Scores  PHQ - 2 Score 0 0 0 0 0 0 0    Fall Risk    02/18/2022    1:20 PM 04/09/2021    1:29 PM 02/12/2021    1:20 PM 04/03/2020    1:20 PM 04/03/2019    1:45 PM  [place] in the past year? 0 0 0 0 0  Number falls in past yr: 0 0 0    Injury with Fall? 0 0 0    Risk for fall due to : Impaired vision  Impaired vision    Follow up Falls prevention discussed  Falls prevention discussed      FALL RISK PREVENTION PERTAINING TO THE HOME:  Any stairs in or around the home? Yes  If so, are there any without handrails? No  Home free of loose throw rugs in walkways, pet beds, electrical cords, etc? Yes  Adequate lighting in your home to reduce risk of falls? Yes   ASSISTIVE DEVICES UTILIZED TO PREVENT FALLS:  Life alert? No  Use of a cane, walker or w/c? No  Grab bars in the bathroom? No  Shower chair or bench in shower? Yes  Elevated toilet seat or a handicapped toilet? No   TIMED UP AND GO:  Was the test performed? Yes .  Length of time to ambulate 10 feet: 10 sec.   Gait steady and fast without use of assistive device  Cognitive Function:        02/18/2022    1:20  PM 02/12/2021    1:22 PM  6CIT Screen  What Year? 0 points 0 points  What month? 0 points 0 points  What time? 0 points 0 points  Count back from 20 0 points 0 points  Months in reverse 0 points 0 points  Repeat phrase 0 points 0 points  Total Score 0 points 0 points    Immunizations Immunization History  Administered Date(s) Administered   COVID-19, mRNA, vaccine(Comirnaty)12 years and older 11/30/2021   Influenza Split 10/26/2011   Influenza, High Dose Seasonal PF 01/19/2016,  11/14/2017, 10/21/2018   Influenza-Unspecified 02/09/2019, 11/18/2020   PFIZER(Purple Top)SARS-COV-2 Vaccination 03/02/2019, 03/23/2019, 11/29/2019   Pneumococcal Conjugate-13 07/04/2014   Pneumococcal Polysaccharide-23 01/19/2016   Tdap 10/26/2011   Zoster Recombinat (Shingrix) 10/21/2018, 12/20/2018    TDAP status: Due, Education has been provided regarding the importance of this vaccine. Advised may receive this vaccine at local pharmacy or Health Dept. Aware to provide a copy of the vaccination record if obtained from local pharmacy or Health Dept. Verbalized acceptance and understanding.  Flu Vaccine status: Up to date  Pneumococcal vaccine status: Up to date  Covid-19 vaccine status: Completed vaccines  Qualifies for Shingles Vaccine? Yes   Zostavax completed Yes   Shingrix Completed?: Yes  Screening Tests Health Maintenance  Topic Date Due   Hepatitis C Screening  Never done   DTaP/Tdap/Td (2 - Td or Tdap) 10/25/2021   Medicare Annual Wellness (AWV)  02/19/2023   COLONOSCOPY (Pts 45-44yr Insurance coverage will need to be confirmed)  09/03/2024   Pneumonia Vaccine 77 Years old  Completed   INFLUENZA VACCINE  Completed   COVID-19 Vaccine  Completed   Zoster Vaccines- Shingrix  Completed   HPV VACCINES  Aged Out    Health Maintenance  Health Maintenance Due  Topic Date Due   Hepatitis C Screening  Never done   DTaP/Tdap/Td (2 - Td or Tdap) 10/25/2021    Colorectal cancer screening: Type of screening: Colonoscopy. Completed 09/04/19. Repeat every 5 years Additional Screening:  Hepatitis C Screening: does qualify;  Vision Screening: Recommended annual ophthalmology exams for early detection of glaucoma and other disorders of the eye. Is the patient up to date with their annual eye exam?  Yes  Who is the provider or what is the name of the office in which the patient attends annual [facility] exams[facility]  If pt is not established with a provider,  would they like to be referred to a provider to establish care? No .   Dental Screening: Recommended annual dental exams for proper oral hygiene  Community Resource Referral / Chronic Care Management: CRR required this visit?  No   CCM required this visit?  No      Plan:     I have personally reviewed and noted the following in the patient's chart:   Medical and social history Use of alcohol, tobacco or illicit drugs  Current medications and supplements including opioid prescriptions. Patient is not currently taking opioid prescriptions. Functional ability and status Nutritional status Physical activity Advanced directives List of other physicians Hospitalizations, surgeries, and ER visits in previous 12 months Vitals Screenings to include cognitive, depression, and falls Referrals and appointments  In addition, I have reviewed and discussed with patient certain preventive protocols, quality metrics, and best practice recommendations. A written personalized care plan for preventive services as well as general preventive health recommendations were provided to patient.     TWillette Brace LPN   12/84/1324  Nurse  Notes: none

## 2022-02-19 ENCOUNTER — Other Ambulatory Visit: Payer: Self-pay | Admitting: *Deleted

## 2022-02-19 DIAGNOSIS — E785 Hyperlipidemia, unspecified: Secondary | ICD-10-CM

## 2022-02-19 DIAGNOSIS — R739 Hyperglycemia, unspecified: Secondary | ICD-10-CM

## 2022-02-19 DIAGNOSIS — N529 Male erectile dysfunction, unspecified: Secondary | ICD-10-CM

## 2022-02-19 NOTE — Telephone Encounter (Signed)
Ok to schedule lab, future lab order placed

## 2022-04-12 ENCOUNTER — Other Ambulatory Visit (INDEPENDENT_AMBULATORY_CARE_PROVIDER_SITE_OTHER): Payer: Medicare HMO

## 2022-04-12 DIAGNOSIS — N529 Male erectile dysfunction, unspecified: Secondary | ICD-10-CM

## 2022-04-12 DIAGNOSIS — R739 Hyperglycemia, unspecified: Secondary | ICD-10-CM

## 2022-04-12 DIAGNOSIS — E785 Hyperlipidemia, unspecified: Secondary | ICD-10-CM

## 2022-04-12 LAB — COMPREHENSIVE METABOLIC PANEL
ALT: 17 U/L (ref 0–53)
AST: 17 U/L (ref 0–37)
Albumin: 3.7 g/dL (ref 3.5–5.2)
Alkaline Phosphatase: 64 U/L (ref 39–117)
BUN: 25 mg/dL — ABNORMAL HIGH (ref 6–23)
CO2: 29 mEq/L (ref 19–32)
Calcium: 9.2 mg/dL (ref 8.4–10.5)
Chloride: 106 mEq/L (ref 96–112)
Creatinine, Ser: 1.32 mg/dL (ref 0.40–1.50)
GFR: 51.9 mL/min — ABNORMAL LOW (ref >60.00)
Glucose, Bld: 96 mg/dL (ref 70–99)
Potassium: 4.5 mEq/L (ref 3.5–5.1)
Sodium: 141 mEq/L (ref 135–145)
Total Bilirubin: 0.5 mg/dL (ref 0.2–1.2)
Total Protein: 6.1 g/dL (ref 6.0–8.3)

## 2022-04-12 LAB — LIPID PANEL
Cholesterol: 167 mg/dL (ref 0–200)
HDL: 60.2 mg/dL (ref >39.00)
LDL Cholesterol: 92 mg/dL (ref 0–99)
NonHDL: 106.83
Total CHOL/HDL Ratio: 3
Triglycerides: 73 mg/dL (ref 0.0–149.0)
VLDL: 14.6 mg/dL (ref 0.0–40.0)

## 2022-04-12 LAB — TSH: TSH: 2.86 u[IU]/mL (ref 0.35–5.50)

## 2022-04-12 LAB — CBC
HCT: 43.2 % (ref 39.0–52.0)
Hemoglobin: 14.4 g/dL (ref 13.0–17.0)
MCHC: 33.4 g/dL (ref 30.0–36.0)
MCV: 92.8 fl (ref 78.0–100.0)
Platelets: 183 10*3/uL (ref 150.0–400.0)
RBC: 4.65 Mil/uL (ref 4.22–5.81)
RDW: 13.4 % (ref 11.5–15.5)
WBC: 6.6 10*3/uL (ref 4.0–10.5)

## 2022-04-12 LAB — PSA: PSA: 2.76 ng/mL (ref 0.10–4.00)

## 2022-04-12 LAB — HEMOGLOBIN A1C: Hgb A1c MFr Bld: 6 % (ref 4.6–6.5)

## 2022-04-14 NOTE — Progress Notes (Signed)
Please inform patient of the following:  His labs are all stable compared to previous.  Cholesterol looks much better than last year.  Do not need to make any changes to his treatment plan at this time.  We can discuss more at his upcoming appointment.

## 2022-04-15 ENCOUNTER — Ambulatory Visit (INDEPENDENT_AMBULATORY_CARE_PROVIDER_SITE_OTHER): Payer: Medicare HMO | Admitting: Family Medicine

## 2022-04-15 ENCOUNTER — Encounter: Payer: Self-pay | Admitting: Family Medicine

## 2022-04-15 VITALS — BP 183/80 | HR 58 | Temp 97.7°F | Ht 73.0 in | Wt 197.0 lb

## 2022-04-15 DIAGNOSIS — S61411A Laceration without foreign body of right hand, initial encounter: Secondary | ICD-10-CM

## 2022-04-15 DIAGNOSIS — K2100 Gastro-esophageal reflux disease with esophagitis, without bleeding: Secondary | ICD-10-CM

## 2022-04-15 DIAGNOSIS — N183 Chronic kidney disease, stage 3 unspecified: Secondary | ICD-10-CM

## 2022-04-15 DIAGNOSIS — I10 Essential (primary) hypertension: Secondary | ICD-10-CM

## 2022-04-15 DIAGNOSIS — Z0001 Encounter for general adult medical examination with abnormal findings: Secondary | ICD-10-CM | POA: Diagnosis not present

## 2022-04-15 DIAGNOSIS — R739 Hyperglycemia, unspecified: Secondary | ICD-10-CM | POA: Diagnosis not present

## 2022-04-15 DIAGNOSIS — Z23 Encounter for immunization: Secondary | ICD-10-CM | POA: Diagnosis not present

## 2022-04-15 DIAGNOSIS — E785 Hyperlipidemia, unspecified: Secondary | ICD-10-CM

## 2022-04-15 DIAGNOSIS — S61419A Laceration without foreign body of unspecified hand, initial encounter: Secondary | ICD-10-CM

## 2022-04-15 MED ORDER — AMLODIPINE BESYLATE 5 MG PO TABS: 5.0000 mg | ORAL_TABLET | Freq: Every day | ORAL | 3 refills | Status: DC

## 2022-04-15 NOTE — Assessment & Plan Note (Signed)
A1c stable 6.0.  We can recheck in a year.  Discussed lifestyle modifications.

## 2022-04-15 NOTE — Assessment & Plan Note (Signed)
Stable on Prilosec 40 mg daily.

## 2022-04-15 NOTE — Patient Instructions (Signed)
It was very nice to see you today!  Please start the amlodipine to help with your blood pressure.  Send me a message in a few weeks after starting this to limit how this is going.  We will flush out your ears today.  Will give your tetanus shot today.  Please come back in year for your next physical.  Come back sooner if needed.  Take care, Dr Jerline Pain  PLEASE NOTE:  If you had any lab tests, please let us know if you have not heard back within a few days. You may see your results on mychart before we have a chance to review them but we will give you a call once they are reviewed by [place].   If we ordered any referrals today, please let us know if you have not heard from their office within the next week.   If you had any urgent prescriptions sent in today, please check with the pharmacy within an hour of our visit to make sure the prescription was transmitted appropriately.   Please try these tips to maintain a healthy lifestyle:  Eat at least 3 REAL meals and 1-2 snacks per day.  Aim for no more than 5 hours between eating.  If you eat breakfast, please do so within one hour of getting up.   Each meal should contain half fruits/vegetables, one quarter protein, and one quarter carbs (no bigger than a computer mouse)  Cut down on sweet beverages. This includes juice, soda, and sweet tea.   Drink at least 1 glass of water with each meal and aim for at least 8 glasses per day  Exercise at least 150 minutes every week.    Preventive Care 110 Years and Older, Male Preventive care refers to lifestyle choices and visits with your health care provider that can promote health and wellness. Preventive care visits are also called wellness exams. What can I expect for my preventive care visit? Counseling During your preventive care visit, your health care provider may ask about your: Medical history, including: Past medical problems. Family medical history. History of falls. Current health,  including: Emotional well-being. Home life and relationship well-being. Sexual activity. Memory and ability to understand (cognition). Lifestyle, including: Alcohol, nicotine or tobacco, and drug use. Access to firearms. Diet, exercise, and sleep habits. Work and work Statistician. Sunscreen use. Safety issues such as seatbelt and bike helmet use. Physical exam Your health care provider will check your: Height and weight. These may be used to calculate your BMI (body mass index). BMI is a measurement that tells if you are at a healthy weight. Waist circumference. This measures the distance around your waistline. This measurement also tells if you are at a healthy weight and may help predict your risk of certain diseases, such as type 2 diabetes and high blood pressure. Heart rate and blood pressure. Body temperature. Skin for abnormal spots. What immunizations do I need?  Vaccines are usually given at various ages, according to a schedule. Your health care provider will recommend vaccines for you based on your age, medical history, and lifestyle or other factors, such as travel or where you work. What tests do I need? Screening Your health care provider may recommend screening tests for certain conditions. This may include: Lipid and cholesterol levels. Diabetes screening. This is done by checking your blood sugar (glucose) after you have not eaten for a while (fasting). Hepatitis C test. Hepatitis B test. HIV (human immunodeficiency virus) test. STI (sexually transmitted infection)  testing, if you are at risk. Lung cancer screening. Colorectal cancer screening. Prostate cancer screening. Abdominal aortic aneurysm (AAA) screening. You may need this if you are a current or former smoker. Talk with your health care provider about your test results, treatment options, and if necessary, the need for more tests. Follow these instructions at home: Eating and drinking  Eat a diet that  includes fresh fruits and vegetables, whole grains, lean protein, and low-fat dairy products. Limit your intake of foods with high amounts of sugar, saturated fats, and salt. Take vitamin and mineral supplements as recommended by your health care provider. Do not drink alcohol if your health care provider tells you not to drink. If you drink alcohol: Limit how much you have to 0-2 drinks a day. Know how much alcohol is in your drink. In the [place] one drink equals one 12 oz bottle of beer (355 mL), one 5 oz glass of wine (148 mL), or one 1 oz glass of hard liquor (44 mL). Lifestyle Brush your teeth every morning and night with fluoride toothpaste. Floss one time each day. Exercise for at least 30 minutes 5 or more days each week. Do not use any products that contain nicotine or tobacco. These products include cigarettes, chewing tobacco, and vaping devices, such as e-cigarettes. If you need help quitting, ask your health care provider. Do not use drugs. If you are sexually active, practice safe sex. Use a condom or other form of protection to prevent STIs. Take aspirin only as told by your health care provider. Make sure that you understand how much to take and what form to take. Work with your health care provider to find out whether it is safe and beneficial for you to take aspirin daily. Ask your health care provider if you need to take a cholesterol-lowering medicine (statin). Find healthy ways to manage stress, such as: Meditation, yoga, or listening to music. Journaling. Talking to a trusted person. Spending time with friends and family. Safety Always wear your seat belt while driving or riding in a vehicle. Do not drive: If you have been drinking alcohol. Do not ride with someone who has been drinking. When you are tired or distracted. While texting. If you have been using any mind-altering substances or drugs. Wear a helmet and other protective equipment during sports  activities. If you have firearms in your house, make sure you follow all gun safety procedures. Minimize exposure to UV radiation to reduce your risk of skin cancer. What's next? Visit your health care provider once a year for an annual wellness visit. Ask your health care provider how often you should have your eyes and teeth checked. Stay up to date on all vaccines. This information is not intended to replace advice given to you by your health care provider. Make sure you discuss any questions you have with your health care provider. Document Revised: 07/23/2020 Document Reviewed: 07/23/2020 Elsevier Patient Education  [place].

## 2022-04-15 NOTE — Addendum Note (Signed)
Addended by: Betti Cruz on: 04/15/2022 12:30 PM   Modules accepted: Orders

## 2022-04-15 NOTE — Assessment & Plan Note (Signed)
Lipids look better on recent labs with higher dose Lipitor 40 mg daily.  LDL is at goal.  We discussed lifestyle modifications.  He will continue current dose of Lipitor.  We can recheck in a year.

## 2022-04-15 NOTE — Progress Notes (Signed)
Chief Complaint:  Eduardo Shaw is a 77 y.o. male who presents today for his annual comprehensive physical exam.    Assessment/Plan:  New/Acute Problems: Sensation of ear clogged Mild ceruminosis on exam today.  This was irrigated.  Recommended auto insufflation.  May have some eustachian tube dysfunction.  We did discuss referral to ENT however he declined.  Laceration Needs tetanus updated.  Will give today.  Anticipate this will heal normally.  He will let me know if he runs into any issues.  Chronic Problems Addressed Today: Essential hypertension Blood pressure is elevated today and has been elevated at home for the last several months.  He has previously been on lisinopril-HCTZ however was able to stop this a few years ago.  We did discuss lifestyle modifications including low-sodium diet and regular exercise.  We discussed medication options.  He is agreeable to start antihypertensives at this point.  We will start amlodipine 5 mg daily.  We discussed potential side effects.  He will continue to monitor at home and check in with [place] in a few weeks via MyChart.  We can titrate the dose as needed.  Hyperglycemia A1c stable 6.0.  We can recheck in a year.  Discussed lifestyle modifications.  Hyperlipidemia Lipids look better on recent labs with higher dose Lipitor 40 mg daily.  LDL is at goal.  We discussed lifestyle modifications.  He will continue current dose of Lipitor.  We can recheck in a year.  Gastroesophageal reflux disease with esophagitis Stable on Prilosec 40 mg daily.  CKD (chronic kidney disease) stage 3, GFR 30-59 ml/min (HCC) GFR stable on recent labs.  Preventative Healthcare: Due for colonoscopy in 2 years.  Up-to-date on vaccines.  Patient Counseling(The following topics were reviewed and/or handout was given):  -Nutrition: Stressed importance of moderation in sodium/caffeine intake, saturated fat and cholesterol, caloric balance, sufficient intake of fresh  fruits, vegetables, and fiber.  -Stressed the importance of regular exercise.   -Substance Abuse: Discussed cessation/primary prevention of tobacco, alcohol, or other drug use; driving or other dangerous activities under the influence; availability of treatment for abuse.   -Injury prevention: Discussed safety belts, safety helmets, smoke detector, smoking near bedding or upholstery.   -Sexuality: Discussed sexually transmitted diseases, partner selection, use of condoms, avoidance of unintended pregnancy and contraceptive alternatives.   -Dental health: Discussed importance of regular tooth brushing, flossing, and dental visits.  -Health maintenance and immunizations reviewed. Please refer to Health maintenance section.  Return to care in 1 year for next preventative visit.     Subjective:  HPI:  See A/p for status of chronic conditions.  He has a few additional concerns that he would like to discuss today.  Still has ongoing sensation that right ear is clogged.  This seems to be coming and going.  Something going on for years.  No obvious aggravating factors no obvious alleviating factors.  Hearing has been stable.  He is also concerned about high blood pressure readings.  Has been monitoring at home for the last several months and it is typically in the 170s.  No reported chest pain or shortness of breath.  No reported headache.  Couple days ago did have a laceration on right hand from reaching into another.  Thinks he may have burned the area.  Lifestyle Diet: Balanced. Plenty of fruits and vegetables.  Exercise: Walking.      04/15/2022   10:41 AM  Depression screen PHQ 2/9  Decreased Interest 0  Down, Depressed,  Hopeless 0  PHQ - 2 Score 0    Health Maintenance Due  Topic Date Due   DTaP/Tdap/Td (2 - Td or Tdap) 10/25/2021     ROS: Per HPI, otherwise a complete review of systems was negative.   PMH:  The following were reviewed and entered/updated in epic: Past  Medical History:  Diagnosis Date   Arthritis of foot    Carotid artery occlusion    Cataract    Colon polyps    adenomatous   Diverticulosis    Esophageal stricture    GERD (gastroesophageal reflux disease)    erosive esophagitis   Hypercholesterolemia    Hypertension    Lactose intolerance    Sarcoidosis 09/20/2007   Qualifier: Diagnosis of  By: Lenn Cal Deborra Medina), Susanne     Skin cancer    skin CA-2019   Stroke [facility]) June 2013   Mini    Patient Active Problem List   Diagnosis Date Noted   Asymptomatic cholelithiasis 04/21/2021   Osteoarthritis 04/03/2020   Spinal stenosis 10/10/2019   Hyperglycemia 04/05/2019   Polyarthralgia 07/04/2018   CKD (chronic kidney disease) stage 3, GFR 30-59 ml/min (HCC) 03/29/2018   Gastroesophageal reflux disease with esophagitis 03/28/2018   Essential hypertension 03/07/2017   Hyperlipidemia 10/26/2011   Erectile dysfunction 10/26/2011   History of skin cancer 10/26/2011   Carotid stenosis 07/31/2011   Past Surgical History:  Procedure Laterality Date   CAROTID ENDARTERECTOMY Right August 03, 2011   CE   COLONOSCOPY     [place]eras ago [place], normal per pt   ENDARTERECTOMY  08/03/2011   Procedure: ENDARTERECTOMY CAROTID;  Surgeon: Angelia Mould, MD;  Location: [facility] OR;  Service: Vascular;  Laterality: Right;   LUMBAR LAMINECTOMY/DECOMPRESSION MICRODISCECTOMY Bilateral 02/23/2014   Procedure: BILATERAL LUMBAR THREE-FOUR LAMINOTOMY AND MICRODISCECTOMY WITH RIGHT LUMBAR FOUR-FIVE LAMINOTOMY AND MICRODISCECTOMY;  Surgeon: Hosie Spangle, MD;  Location: [place];  Service: Neurosurgery;  Laterality: Bilateral;  Bilateral L34 laminotomy with Left L34 microdiskectomy and possible Right L34 microdiskectomy and Right L45 Laminotomy and foraminotomy   MOHS SURGERY     to face   TONSILLECTOMY     TRANSFORAMINAL LUMBAR INTERBODY FUSION (TLIF) WITH PEDICLE SCREW FIXATION 1 LEVEL Right 10/10/2019   Procedure: LEFT-SIDED LUMBAR TWO - LUMBAR THREE  TRANSFORAMINAL LUMBAR INTERBODY FUSION WITH INSTRUMENTATION AND ALLOGRAFT;  Surgeon: Phylliss Bob, MD;  Location: [place];  Service: Orthopedics;  Laterality: Right;  LEFT-SIDED LUMBAR TWO - LUMBAR THREE TRANSFORAMINAL LUMBAR INTERBODY FUSION WITH INSTRUMENTATION AND ALLOGRAFT    Family History  Problem Relation Age of Onset   Lupus Mother    Stroke Mother    Diabetes Sister    Lupus Sister    Colon cancer Neg Hx    Colon polyps Neg Hx    Rectal cancer Neg Hx    Stomach cancer Neg Hx    Esophageal cancer Neg Hx    Pancreatic cancer Neg Hx    Liver disease Neg Hx    Kidney disease Neg Hx     Medications- reviewed and updated Current Outpatient Medications  Medication Sig Dispense Refill   amLODipine (NORVASC) 5 MG tablet Take 1 tablet (5 mg total) by mouth daily. 90 tablet 3   aspirin 81 MG chewable tablet Chew by mouth daily.     atorvastatin (LIPITOR) 40 MG tablet Take 1 tablet (40 mg total) by mouth daily. 90 tablet 3   omeprazole (PRILOSEC) 40 MG capsule TAKE 1 CAPSULE (40 MG TOTAL) BY MOUTH DAILY.  90 capsule 3   No current facility-administered medications for this visit.    Allergies-reviewed and updated Allergies  Allergen Reactions   Morphine And Related Nausea And Vomiting    Gi upset   Other     Bee sting - swelling in the areas stung    Social History   Socioeconomic History   Marital status: Married    Spouse name: Not on file   Number of children: Not on file   Years of education: Not on file   Highest education level: Not on file  Occupational History   Not on file  Tobacco Use   Smoking status: Never   Smokeless tobacco: Never  Vaping Use   Vaping Use: Never used  Substance and Sexual Activity   Alcohol use: Yes    Alcohol/week: 0.0 standard drinks of alcohol    Comment: 1-2 drinks on weekends     Drug use: No   Sexual activity: Not on file  Other Topics Concern   Not on file  Social History Narrative   ** Merged History Encounter **        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  Finished high school.  Can read and write.  Hasn't seen a doctor in many years.   Social Determinants of Health   Financial Resource Strain: Low Risk  (02/18/2022)   Overall Financial Resource Strain (CARDIA)    Difficulty of Paying Living Expenses: Not hard at all  Food Insecurity: No Food Insecurity (02/18/2022)   Hunger Vital Sign    Worried About Running Out of Food in the Last Year: Never true    Ran Out of Food in the Last Year: Never true  Transportation Needs: No Transportation Needs (02/18/2022)   PRAPARE - Hydrologist (Medical): No    Lack of Transportation (Non-Medical): No  Physical Activity: Sufficiently Active (02/18/2022)   Exercise Vital Sign    Days of Exercise per Week: 3 days    Minutes of Exercise per Session: 60 min  Stress: No Stress Concern Present (02/18/2022)   [place]    Feeling of Stress : Not at all  Social Connections: Moderately Integrated (02/18/2022)   Social Connection and Isolation Panel [NHANES]    Frequency of Communication with Friends and Family: Once a week    Frequency of Social Gatherings with Friends and Family: More than three times a week    Attends Religious Services: More than 4 times per year    Active Member of [employer] or Organizations: No    Attends Music therapist: Never    Marital Status: Married        Objective:  Physical Exam: BP (!) 170/67   Pulse (!) 58   Temp 97.7 F (36.5 C) (Temporal)   Ht '6\' 1"'$  (1.854 m)   Wt 197 lb (89.4 kg)   SpO2 95%   BMI 25.99 kg/m   Body mass index is 25.99 kg/m. Wt Readings from Last 3 Encounters:  04/15/22 197 lb (89.4 kg)  02/18/22 195 lb 3.2 oz (88.5 kg)  05/21/21 202 lb 1.6 oz (91.7 kg)   Gen: NAD, resting comfortably HEENT: TMs normal bilaterally. OP clear. No thyromegaly noted.  CV: RRR  with no murmurs appreciated Pulm: NWOB, CTAB with no crackles, wheezes, or rhonchi GI: Normal bowel sounds present. Soft, Nontender, Nondistended. MSK: no edema, cyanosis, or clubbing noted Skin:  warm, dry Neuro: CN2-12 grossly intact. Strength 5/5 in upper and lower extremities. Reflexes symmetric and intact bilaterally.  Psych: Normal affect and thought content     Maudean Hoffmann M. Jerline Pain, MD 04/15/2022 11:17 AM

## 2022-04-15 NOTE — Assessment & Plan Note (Signed)
GFR stable on recent labs.

## 2022-04-15 NOTE — Assessment & Plan Note (Signed)
Blood pressure is elevated today and has been elevated at home for the last several months.  He has previously been on lisinopril-HCTZ however was able to stop this a few years ago.  We did discuss lifestyle modifications including low-sodium diet and regular exercise.  We discussed medication options.  He is agreeable to start antihypertensives at this point.  We will start amlodipine 5 mg daily.  We discussed potential side effects.  He will continue to monitor at home and check in with [place] in a few weeks via MyChart.  We can titrate the dose as needed.

## 2022-04-28 DIAGNOSIS — L821 Other seborrheic keratosis: Secondary | ICD-10-CM | POA: Diagnosis not present

## 2022-04-28 DIAGNOSIS — L570 Actinic keratosis: Secondary | ICD-10-CM | POA: Diagnosis not present

## 2022-04-28 DIAGNOSIS — Z85828 Personal history of other malignant neoplasm of skin: Secondary | ICD-10-CM | POA: Diagnosis not present

## 2022-06-10 ENCOUNTER — Other Ambulatory Visit: Payer: Self-pay | Admitting: *Deleted

## 2022-06-10 DIAGNOSIS — I6523 Occlusion and stenosis of bilateral carotid arteries: Secondary | ICD-10-CM

## 2022-06-10 NOTE — Addendum Note (Signed)
Addended by: Thomasena Edis on: 06/10/2022 10:57 AM   Modules accepted: Orders

## 2022-06-14 ENCOUNTER — Other Ambulatory Visit: Payer: Self-pay | Admitting: Family Medicine

## 2022-06-17 ENCOUNTER — Ambulatory Visit (HOSPITAL_COMMUNITY)
Admission: RE | Admit: 2022-06-17 | Discharge: 2022-06-17 | Disposition: A | Discharge status: Home / Self Care | Payer: Medicare HMO | Source: Ambulatory Visit | Attending: Vascular Surgery | Admitting: Vascular Surgery

## 2022-06-17 ENCOUNTER — Ambulatory Visit: Payer: Medicare HMO | Admitting: Physician Assistant

## 2022-06-17 ENCOUNTER — Ambulatory Visit (INDEPENDENT_AMBULATORY_CARE_PROVIDER_SITE_OTHER): Payer: Medicare HMO | Admitting: Family Medicine

## 2022-06-17 ENCOUNTER — Encounter: Payer: Self-pay | Admitting: Family Medicine

## 2022-06-17 VITALS — BP 156/70 | HR 56 | Temp 97.6°F | Resp 18 | Ht 73.0 in | Wt 199.0 lb

## 2022-06-17 VITALS — BP 146/67 | HR 60 | Temp 97.8°F | Ht 73.0 in | Wt 199.4 lb

## 2022-06-17 DIAGNOSIS — I6523 Occlusion and stenosis of bilateral carotid arteries: Secondary | ICD-10-CM | POA: Diagnosis not present

## 2022-06-17 DIAGNOSIS — M2550 Pain in unspecified joint: Secondary | ICD-10-CM | POA: Diagnosis not present

## 2022-06-17 DIAGNOSIS — R200 Anesthesia of skin: Secondary | ICD-10-CM

## 2022-06-17 DIAGNOSIS — I10 Essential (primary) hypertension: Secondary | ICD-10-CM | POA: Diagnosis not present

## 2022-06-17 NOTE — Patient Instructions (Addendum)
It was very nice to see you today!  I think you probably have a pinched nerve though we we will have you seen by cardiologist to make sure nothing else is going on.  Please let us know if your symptoms worsen.  Return if symptoms worsen or fail to improve.   Take care, Dr Jimmey Ralph  PLEASE NOTE:  If you had any lab tests, please let us know if you have not heard back within a few days. You may see your results on mychart before we have a chance to review them but we will give you a call once they are reviewed by [place].   If we ordered any referrals today, please let us know if you have not heard from their office within the next week.   If you had any urgent prescriptions sent in today, please check with the pharmacy within an hour of our visit to make sure the prescription was transmitted appropriately.   Please try these tips to maintain a healthy lifestyle:  Eat at least 3 REAL meals and 1-2 snacks per day.  Aim for no more than 5 hours between eating.  If you eat breakfast, please do so within one hour of getting up.   Each meal should contain half fruits/vegetables, one quarter protein, and one quarter carbs (no bigger than a computer mouse)  Cut down on sweet beverages. This includes juice, soda, and sweet tea.   Drink at least 1 glass of water with each meal and aim for at least 8 glasses per day  Exercise at least 150 minutes every week.

## 2022-06-17 NOTE — Assessment & Plan Note (Signed)
Likely has underlying OA.  We did discuss referral to sports medicine however he deferred for now.  He can take Tylenol as needed.

## 2022-06-17 NOTE — Assessment & Plan Note (Signed)
Blood pressure much better controlled today.  Will continue amlodipine 5 mg daily and he will monitor at home.  He will let us know if persistently elevated at home and we can adjust dose of medication as needed.

## 2022-06-17 NOTE — Progress Notes (Signed)
   Eduardo Shaw is a 77 y.o. male who presents today for an office visit.  Assessment/Plan:  New/Acute Problems: Left Arm Paresthesias  Based on history sounds like this is probably cervical radiculopathy though his spurling test today is negative.  Also discussed with patient that this may represent some other compressive neuropathy such as carpal tunnel or cubital tunnel.  His history is atypical for cardiac etiology however he does have significant risk factors including age, dyslipidemia, and hypertension.  His EKG today is reassuring without any signs of ischemic changes and he is currently asymptomatic.  Would be reasonable to rule out cardiac etiologies even with his atypical history.  Will place referral to cardiology.  We discussed reasons to return to care or seek emergent care.  We did discuss treatment options for his paresthesias however at this point symptoms are not particularly bothersome and he only wishes to rule out any potential major etiologies.  We did discuss referral to sports medicine at some point in the future if needed if his cardiac workup is negative and symptoms persist.   Chronic Problems Addressed Today: Essential hypertension Blood pressure much better controlled today.  Will continue amlodipine 5 mg daily and he will monitor at home.  He will let us know if persistently elevated at home and we can adjust dose of medication as needed.  Polyarthralgia Likely has underlying OA.  We did discuss referral to sports medicine however he deferred for now.  He can take Tylenol as needed.     Subjective:  HPI:  See A/P for status of chronic conditions.  Patient is here with intermittent numbness and tingling in left arm for the last couple of weeks.  Feels like it is falling asleep. Thinks that it is probably positional. Feels like his whole arm is going numb. Symptoms almost happen at rest. HAppens for a few minutes and then goes. No chest pain or shortness of  breath.  He has been able to maintain his normal exertional level without any exacerbation of symptoms.  No dizziness.  No lightheadedness.  He mentioned this with vascular surgery this morning and they advised him to follow-up here for further evaluation.  He does have diffuse joint pain and extremities.  Also has a history of lumbar spondylosis and spinal stenosis and has seen sports medicine and orthopedics for this in the past.  Pain is overall manageable though he would like to know what he can do for pain control.       Objective:  Physical Exam: BP (!) 146/67   Pulse 60   Temp 97.8 F (36.6 C) (Temporal)   Ht 6\' 1"  (1.854 m)   Wt 199 lb 6.4 oz (90.4 kg)   SpO2 96%   BMI 26.31 kg/m   Gen: No acute distress, resting comfortably CV: Regular rate and rhythm with no murmurs appreciated Pulm: Normal work of breathing, clear to auscultation bilaterally with no crackles, wheezes, or rhonchi MUSCULOSKELETAL: - LEft ARm: No deformities.  Neurovascular intact distally.  Strength 5 out of 5 throughout.  Sensation light touch intact throughout.  Tinel sign negative at wrist and medial epicondyles.  Spurling test negative. Neuro: Grossly normal, moves all extremities Psych: Normal affect and thought content  EKG: Sinus bradycardia with ventriculare rate 58. No ischemic changes.       Katina Degree. Jimmey Ralph, MD 06/17/2022 2:36 PM

## 2022-06-17 NOTE — Progress Notes (Signed)
HISTORY AND PHYSICAL     CC:  follow up. Requesting Provider:  Ardith Dark, MD  HPI: This is a 77 y.o. male here for follow up for carotid artery stenosis.  Pt is s/p right CEA for symptomatic carotid artery stenosis on 08/03/2011 by Dr. Edilia Bo.    Pt was last seen 05/21/2021 and at that time he was doing well without neurological sx.   Pt returns today for follow up.    Pt denies any amaurosis fugax, speech difficulties, weakness, numbness, paralysis or clumsiness or facial droop.   He does state that he has had some left arm numbness over the past couple of weeks that lasts for about 2 minutes.  He could not pinpoint when this was happening.   He denies any chest pain or shortness of breath.  He denies any left leg symptoms.  He denies any neck issues.  He has been compliant with his asa/statin.    The pt is on a statin for cholesterol management.  The pt is on a daily aspirin.   Other AC:  none The pt is on CCB for hypertension.   The pt is not on medication for diabetes Tobacco hx:  never  Pt does  have family hx of AAA.  At his last visit, he had told me he had been screened and it was negative for AAA.   Past Medical History:  Diagnosis Date   Arthritis of foot    Carotid artery occlusion    Cataract    Colon polyps    adenomatous   Diverticulosis    Esophageal stricture    GERD (gastroesophageal reflux disease)    erosive esophagitis   Hypercholesterolemia    Hypertension    Lactose intolerance    Sarcoidosis 09/20/2007   Qualifier: Diagnosis of  By: Truman Hayward Duncan Dull), Susanne     Skin cancer    skin CA-2019   Stroke [facility]) June 2013   Mini     Past Surgical History:  Procedure Laterality Date   CAROTID ENDARTERECTOMY Right August 03, 2011   CE   COLONOS[place]     [place], normal per pt   ENDARTERECTOMY  08/03/2011   Procedure: ENDARTERECTOMY CAROTID;  Surgeon: Chuck Hint, MD;  Location: [facility] OR;  Service: Vascular;  Laterality: Right;    LUMBAR LAMINECTOMY/DECOMPRESSION MICRODISCECTOMY Bilateral 02/23/2014   Procedure: BILATERAL LUMBAR THREE-FOUR LAMINOTOMY AND MICRODISCECTOMY WITH RIGHT LUMBAR FOUR-FIVE LAMINOTOMY AND MICRODISCECTOMY;  Surgeon: Hewitt Shorts, MD;  Location: [facility] OR;  Service: Neurosurgery;  Laterality: Bilateral;  Bilateral L34 laminotomy with Left L34 microdiskectomy and possible Right L34 microdiskectomy and Right L45 Laminotomy and foraminotomy   MOHS SURGERY     to face   TONSILLECTOMY     TRANSFORAMINAL LUMBAR INTERBODY FUSION (TLIF) WITH PEDICLE SCREW FIXATION 1 LEVEL Right 10/10/2019   Procedure: LEFT-SIDED LUMBAR TWO - LUMBAR THREE TRANSFORAMINAL LUMBAR INTERBODY FUSION WITH INSTRUMENTATION AND ALLOGRAFT;  Surgeon: Estill Bamberg, MD;  Location: [facility] OR;  Service: Orthopedics;  Laterality: Right;  LEFT-SIDED LUMBAR TWO - LUMBAR THREE TRANSFORAMINAL LUMBAR INTERBODY FUSION WITH INSTRUMENTATION AND ALLOGRAFT    Allergies  Allergen Reactions   Morphine And Related Nausea And Vomiting    Gi upset   Other     Bee sting - swelling in the areas stung    Current Outpatient Medications  Medication Sig Dispense Refill   amLODipine (NORVASC) 5 MG tablet Take 1 tablet (5 mg total) by mouth daily. 90 tablet 3  aspirin 81 MG chewable tablet Chew by mouth daily.     atorvastatin (LIPITOR) 40 MG tablet TAKE 1 TABLET EVERY DAY 90 tablet 3   omeprazole (PRILOSEC) 40 MG capsule TAKE 1 CAPSULE (40 MG TOTAL) BY MOUTH DAILY. 90 capsule 3   No current facility-administered medications for this visit.    Family History  Problem Relation Age of Onset   Lupus Mother    Stroke Mother    Diabetes Sister    Lupus Sister    Colon cancer Neg Hx    Colon polyps Neg Hx    Rectal cancer Neg Hx    Stomach cancer Neg Hx    Esophageal cancer Neg Hx    Pancreatic cancer Neg Hx    Liver disease Neg Hx    Kidney disease Neg Hx     Social History   Socioeconomic History   Marital status: Married    Spouse name: Not  on file   Number of children: Not on file   Years of education: Not on file   Highest education level: Not on file  Occupational History   Not on file  Tobacco Use   Smoking status: Never   Smokeless tobacco: Never  Vaping Use   Vaping Use: Never used  Substance and Sexual Activity   Alcohol use: Yes    Alcohol/week: 0.0 standard drinks of alcohol    Comment: 1-2 drinks on weekends     Drug use: No   Sexual activity: Not on file  Other Topics Concern   Not on file  Social History Narrative   ** Merged History Encounter **       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  Finished high school.  Can read and write.  Hasn't seen a doctor in many years.   Social Determinants of Health   Financial Resource Strain: Low Risk  (02/18/2022)   Overall Financial Resource Strain (CARDIA)    Difficulty of Paying Living Expenses: Not hard at all  Food Insecurity: No Food Insecurity (02/18/2022)   Hunger Vital Sign    Worried About Running Out of Food in the Last Year: Never true    Ran Out of Food in the Last Year: Never true  Transportation Needs: No Transportation Needs (02/18/2022)   PRAPARE - Administrator, Civil Service (Medical): No    Lack of Transportation (Non-Medical): No  Physical Activity: Sufficiently Active (02/18/2022)   Exercise Vital Sign    Days of Exercise per Week: 3 days    Minutes of Exercise per Session: 60 min  Stress: No Stress Concern Present (02/18/2022)   [employer] of Occupational Health - Occupational Stress Questionnaire    Feeling of Stress : Not at all  Social Connections: Moderately Integrated (02/18/2022)   Social Connection and Isolation Panel [NHANES]    Frequency of Communication with Friends and Family: Once a week    Frequency of Social Gatherings with Friends and Family: More than three times a week    Attends Religious Services: More than 4 times per year    Active Member of [employer] or  Organizations: No    Attends Banker Meetings: Never    Marital Status: Married  Catering manager Violence: Not At Risk (02/18/2022)   Humiliation, Afraid, Rape, and Kick questionnaire    Fear of Current or Ex-Partner: No    Emotionally Abused: No    Physically Abused: No    Sexually Abused:  No     REVIEW OF SYSTEMS:   [X]  denotes positive finding, [ ]  denotes negative finding Cardiac  Comments:  Chest pain or chest pressure:    Shortness of breath upon exertion:    Short of breath when lying flat:    Irregular heart rhythm:        Vascular    Pain in calf, thigh, or hip brought on by ambulation:    Pain in feet at night that wakes you up from your sleep:     Blood clot in your veins:    Leg swelling:         Pulmonary    Oxygen at home:    Productive cough:     Wheezing:         Neurologic    Sudden weakness in arms or legs:  x See HPI  Sudden numbness in arms or legs:     Sudden onset of difficulty speaking or slurred speech:    Temporary loss of vision in one eye:     Problems with dizziness:         Gastrointestinal    Blood in stool:     Vomited blood:         Genitourinary    Burning when urinating:     Blood in urine:        Psychiatric    Major depression:         Hematologic    Bleeding problems:    Problems with blood clotting too easily:        Skin    Rashes or ulcers:        Constitutional    Fever or chills:      PHYSICAL EXAMINATION:  Today's Vitals   06/17/22 1155  BP: (!) 156/70  Pulse: (!) 56  Resp: 18  Temp: 97.6 F (36.4 C)  TempSrc: Temporal  SpO2: 97%  Weight: 199 lb (90.3 kg)  Height: 6\' 1"  (1.854 m)   Body mass index is 26.25 kg/m.   General:  WDWN in NAD; vital signs documented above Gait: Not observed HENT: WNL, normocephalic Pulmonary: normal non-labored breathing Cardiac: regular HR, without carotid bruits Skin: without rashes Vascular Exam/Pulses:  Right Left  Radial 2+ (normal) 2+ (normal)   PT 2+ (normal) 2+ (normal)   Extremities: without open wounds Musculoskeletal: no muscle wasting or atrophy  Neurologic: A&O X 3; moving all extremities equally; speech is fluent/normal Psychiatric:  The pt has Normal affect.   Non-Invasive Vascular Imaging:   Carotid Duplex on 06/17/2022 Right: normal Left:  1-39% ICA stenosis  Previous Carotid duplex on 05/21/2021: Right: 1-39% ICA stenosis Left:   40-59% ICA stenosis    ASSESSMENT/PLAN:: 77 y.o. male here for follow up carotid artery stenosis and is s/p  right CEA for symptomatic carotid artery stenosis on 08/03/2011 by Dr. Edilia Bo.    -duplex today reveals right ICA normal and left 1-39% ICA stenosis.  -pt has been having left arm numbness for about 2 weeks that last a couple of minutes.  He has not had chest pain or shortness of breath.  Discussed with pt that he should call his PCP for further evaluation and if he develops this again and it is prolonged or worse or he has chest pain and/or shortness of breath, he should go to the ER.   -discussed s/s of stroke with pt and he understands should he develop any of these sx, he will go to the nearest ER or  call 911. -pt will f/u in 18 months with carotid duplex -pt will call sooner should he have any issues. -continue statin/asa    Doreatha Massed, [facility] [phone]  Clinic MD:  Edilia Bo

## 2022-08-30 ENCOUNTER — Ambulatory Visit (HOSPITAL_BASED_OUTPATIENT_CLINIC_OR_DEPARTMENT_OTHER): Payer: Medicare HMO | Admitting: Cardiology

## 2022-09-06 ENCOUNTER — Encounter (HOSPITAL_BASED_OUTPATIENT_CLINIC_OR_DEPARTMENT_OTHER): Payer: Self-pay | Admitting: Cardiology

## 2022-09-06 ENCOUNTER — Ambulatory Visit (HOSPITAL_BASED_OUTPATIENT_CLINIC_OR_DEPARTMENT_OTHER): Payer: Medicare HMO | Admitting: Cardiology

## 2022-09-06 VITALS — BP 157/68 | HR 59 | Ht 73.0 in | Wt 199.2 lb

## 2022-09-06 DIAGNOSIS — I6523 Occlusion and stenosis of bilateral carotid arteries: Secondary | ICD-10-CM | POA: Diagnosis not present

## 2022-09-06 DIAGNOSIS — E7800 Pure hypercholesterolemia, unspecified: Secondary | ICD-10-CM

## 2022-09-06 DIAGNOSIS — I10 Essential (primary) hypertension: Secondary | ICD-10-CM

## 2022-09-06 DIAGNOSIS — R202 Paresthesia of skin: Secondary | ICD-10-CM

## 2022-09-06 DIAGNOSIS — Z7189 Other specified counseling: Secondary | ICD-10-CM

## 2022-09-06 DIAGNOSIS — Z9889 Other specified postprocedural states: Secondary | ICD-10-CM | POA: Diagnosis not present

## 2022-09-06 DIAGNOSIS — Z5181 Encounter for therapeutic drug level monitoring: Secondary | ICD-10-CM

## 2022-09-06 DIAGNOSIS — R200 Anesthesia of skin: Secondary | ICD-10-CM | POA: Diagnosis not present

## 2022-09-06 MED ORDER — ROSUVASTATIN CALCIUM 20 MG PO TABS: 20.0000 mg | ORAL_TABLET | Freq: Every day | ORAL | 3 refills | Status: DC

## 2022-09-06 NOTE — Progress Notes (Signed)
Cardiology Office Note:  .    Date:  09/06/2022  ID:  Eduardo Shaw, DOB Apr 15, 1945, MRN [id number] PCP: Ardith Dark, MD  [facility] Providers Cardiologist:  Jodelle Red, MD     History of Present Illness: .    Eduardo Shaw is a 77 y.o. male with a hx of stroke s/p right CEA 07/2011, hypertension, hyperlipidemia, here for the evaluation of hypertension and left arm numbness. His wife is also a patient of mine.  He was seen by Dr. Jimmey Ralph on 06/17/2022. He complained of left arm numbness thought to be probable cervical radiculopathy although spurling test was negative. Noted to have significant cardiac risk factors including age, dyslipidemia, and hypertension. EKG performed at that visit was reassuring without any ischemic changes, sinus bradycardia at 58 bpm . He was referred to cardiology to rule out cardiac etiologies of his left arm numbness. His blood pressure was better controlled on amlodipine 5 mg daily.   Cardiovascular risk factors: Prior clinical ASCVD:  He confirms having a personal history of stroke, s/p right CEA 07/2011. He reports having a heart murmur in his childhood; no further testing or complications. Comorbid conditions:  Hypertension - On amlodipine 5 mg daily since 04/2022. Had been on amlodipine prior to that at one time, discontinued due to dizziness. No dizziness on the 5 mg dose at this time. Metabolic syndrome/Obesity:  Current weight 199 lbs. Chronic inflammatory conditions:  None. Tobacco use history: Never. Family history: AAA. Prior pertinent testing and/or incidental findings: Carotid dopplers 06/2022 showing 1-39% stenosis in the left ICA. Prior right carotid endarterectomy 08/03/2011 by Dr. Edilia Bo, due to symptomatic right carotid stenosis. Continues to follow with vascular surgery. Exercise level: He enjoys walking for exercise, not much recently due to the hot weather. He denies other physical limitations. Current diet: "Everything  bad."   He describes his arm numbness as feeling like his arm is asleep. He does not believe he is leaning on his arms or putting too much pressure on his arm. Primarily in the left arm. Can occur daily, some relief with moving/shaking his arm out. No skin discoloration. No associated pain, no chest pain or shortness of breath. Of note, he states that he had difficulty with his left arm feeling limp at the time of his stroke in 2013.  One remote syncopal episode, many years ago. He does not recall the specifics of this episode. Prior foot issues related to bursitis. Also has arthritis in his hands.   He denies any palpitations, peripheral edema, lightheadedness, headaches, orthopnea, or PND.  ROS:  Please see the history of present illness. ROS otherwise negative except as noted.  (+) Left arm numbness (+) Arthralgias of hands  Studies Reviewed: Marland Kitchen         Bilateral Carotid Dopplers  06/17/2022: Summary:  Right Carotid: There is no evidence of stenosis in the right ICA. Patent CEA.   Left Carotid: Velocities in the left ICA are consistent with a 1-39% stenosis. The ECA appears >50% stenosed.   Vertebrals:  Bilateral vertebral arteries demonstrate antegrade flow.  Subclavians: Normal flow hemodynamics were seen in bilateral subclavian arteries.     Physical Exam:    VS:  BP (!) 157/68 (BP Location: Right Arm, Patient Position: Sitting, Cuff Size: Large)   Pulse (!) 59   Ht 6\' 1"  (1.854 m)   Wt 199 lb 3.2 oz (90.4 kg)   SpO2 96%   BMI 26.28 kg/m    Wt Readings from  Last 3 Encounters:  09/06/22 199 lb 3.2 oz (90.4 kg)  06/17/22 199 lb 6.4 oz (90.4 kg)  06/17/22 199 lb (90.3 kg)    GEN: Well nourished, well developed in no acute distress HEENT: Normal, moist mucous membranes NECK: No JVD CARDIAC: regular rhythm, normal S1 and S2, no rubs or gallops. No murmur. VASCULAR: Radial and DP pulses 2+ bilaterally. No carotid bruits RESPIRATORY:  Clear to auscultation without rales,  wheezing or rhonchi  ABDOMEN: Soft, non-tender, non-distended MUSCULOSKELETAL:  Ambulates independently SKIN: Warm and dry, no edema NEUROLOGIC:  Alert and oriented x 3. No focal neuro deficits noted. PSYCHIATRIC:  Normal affect   ASSESSMENT AND PLAN: .    Left arm numbness -no pain, no radiation, no associated symptoms, nonexertional -highly suggestive of neurologic etiology -reviewed red flag warning signs that need immediate medical attention  Carotid stenosis s/p CEA History of CVA Hypercholesterolemia -last LDL 92 on atorvastatin 40 mg daily -will change to rosuvastatin 20 g daily -reviewed lifestyle recommendations today  Hypertension -above goal today, but reports prior lightheadedness -continue to monitor. If remains consistently >130/80 would either increase amlodipine or add ARB  Dispo: Follow-up in 1 year, or sooner as needed.  I,Mathew Stumpf,acting as a Neurosurgeon for [employer], MD.,have documented all relevant documentation on the behalf of Jodelle Red, MD,as directed by  Jodelle Red, MD while in the presence of Jodelle Red, MD.  I, Jodelle Red, MD, have reviewed all documentation for this visit. The documentation on 09/06/22 for the exam, diagnosis, procedures, and orders are all accurate and complete.   Signed, Jodelle Red, MD

## 2022-09-06 NOTE — Patient Instructions (Addendum)
We are going to change your atorvastatin to rosuvastatin to try to get your cholesterol (LDL) less tha 70. We will recheck labs in 3 mos to follow up on this.  FOLLOW UP WITH DR Cristal Deer IN 1 YEAR

## 2022-10-21 ENCOUNTER — Other Ambulatory Visit: Payer: Self-pay | Admitting: Family Medicine

## 2022-10-21 DIAGNOSIS — K253 Acute gastric ulcer without hemorrhage or perforation: Secondary | ICD-10-CM

## 2022-10-21 DIAGNOSIS — R1314 Dysphagia, pharyngoesophageal phase: Secondary | ICD-10-CM

## 2022-10-21 DIAGNOSIS — K222 Esophageal obstruction: Secondary | ICD-10-CM

## 2022-10-21 DIAGNOSIS — K2100 Gastro-esophageal reflux disease with esophagitis, without bleeding: Secondary | ICD-10-CM

## 2022-10-26 DIAGNOSIS — C44311 Basal cell carcinoma of skin of nose: Secondary | ICD-10-CM | POA: Diagnosis not present

## 2022-10-26 DIAGNOSIS — Z85828 Personal history of other malignant neoplasm of skin: Secondary | ICD-10-CM | POA: Diagnosis not present

## 2022-10-26 DIAGNOSIS — L570 Actinic keratosis: Secondary | ICD-10-CM | POA: Diagnosis not present

## 2022-12-01 DIAGNOSIS — C44311 Basal cell carcinoma of skin of nose: Secondary | ICD-10-CM | POA: Diagnosis not present

## 2022-12-01 DIAGNOSIS — Z85828 Personal history of other malignant neoplasm of skin: Secondary | ICD-10-CM | POA: Diagnosis not present

## 2023-01-29 ENCOUNTER — Other Ambulatory Visit: Payer: Self-pay | Admitting: Family Medicine

## 2023-02-28 ENCOUNTER — Ambulatory Visit (INDEPENDENT_AMBULATORY_CARE_PROVIDER_SITE_OTHER): Payer: Medicare Other

## 2023-02-28 VITALS — BP 138/78 | Temp 97.3°F | Wt 199.0 lb

## 2023-02-28 DIAGNOSIS — Z0000 Encounter for general adult medical examination without abnormal findings: Secondary | ICD-10-CM | POA: Diagnosis not present

## 2023-02-28 NOTE — Progress Notes (Signed)
Subjective:   Eduardo Shaw is a 78 y.o. male who presents for Medicare Annual/Subsequent preventive examination.  Visit Complete: In person  Patient Medicare AWV questionnaire was completed by the patient on 02/22/23; I have confirmed that all information answered by patient is correct and no changes since this date.  Cardiac Risk Factors include: advanced age (>44men, >83 women);dyslipidemia;male gender;hypertension     Objective:    Today's Vitals   02/28/23 1245  BP: 138/78  Temp: (!) 97.3 F (36.3 C)  SpO2: 93%  Weight: 199 lb (90.3 kg)   Body mass index is 26.25 kg/m.     02/28/2023   12:56 PM 02/18/2022    1:19 PM 02/12/2021    1:19 PM 10/03/2019    1:42 PM 10/02/2018   12:11 PM 11/03/2016   12:11 PM 10/29/2015    1:22 PM  Advanced Directives  Does Patient Have a Medical Advance Directive? Yes Yes Yes Yes Yes Yes   Type of Estate agent of [place];Living will Healthcare Power of [place];Living will Living will Healthcare Power of [place];Living will Living will;Healthcare Power of [employer] Power of Attorney Living will  Does patient want to make changes to medical advance directive?    No - Patient declined No - Patient declined    Copy of Healthcare Power of Attorney in Chart? No - copy requested No - copy requested  No - copy requested No - copy requested No - copy requested     Current Medications (verified) Outpatient Encounter Medications as of 02/28/2023  Medication Sig   amLODipine (NORVASC) 5 MG tablet TAKE 1 TABLET EVERY DAY   aspirin 81 MG chewable tablet Chew by mouth daily.   omeprazole (PRILOSEC) 40 MG capsule TAKE 1 CAPSULE EVERY DAY   rosuvastatin (CRESTOR) 20 MG tablet Take 1 tablet (20 mg total) by mouth daily.   No facility-administered encounter medications on file as of 02/28/2023.    Allergies (verified) Morphine and codeine and Other   History: Past Medical History:  Diagnosis Date   Arthritis of foot     Carotid artery occlusion    Cataract    Colon polyps    adenomatous   Diverticulosis    Esophageal stricture    GERD (gastroesophageal reflux disease)    erosive esophagitis   Hypercholesterolemia    Hypertension    Lactose intolerance    Sarcoidosis 09/20/2007   Qualifier: Diagnosis of  By: Truman Hayward Duncan Dull), Susanne     Skin cancer    skin CA-2019   Stroke [facility]) June 2013   Mini    Past Surgical History:  Procedure Laterality Date   CAROTID ENDARTERECTOMY Right August 03, 2011   CE   [place]ONOS[place]     [place], normal per pt   ENDARTERECTOMY  08/03/2011   Procedure: ENDARTERECTOMY CAROTID;  Surgeon: Chuck Hint, MD;  Location: [facility] OR;  Service: Vascular;  Laterality: Right;   LUMBAR LAMINECTOMY/DECOMPRESSION MICRODISCECTOMY Bilateral 02/23/2014   Procedure: BILATERAL LUMBAR THREE-FOUR LAMINOTOMY AND MICRODISCECTOMY WITH RIGHT LUMBAR FOUR-FIVE LAMINOTOMY AND MICRODISCECTOMY;  Surgeon: Hewitt Shorts, MD;  Location: [facility] OR;  Service: Neurosurgery;  Laterality: Bilateral;  Bilateral L34 laminotomy with Left L34 microdiskectomy and possible Right L34 microdiskectomy and Right L45 Laminotomy and foraminotomy   MOHS SURGERY     to face   TONSILLECTOMY     TRANSFORAMINAL LUMBAR INTERBODY FUSION (TLIF) WITH PEDICLE SCREW FIXATION 1 LEVEL Right 10/10/2019   Procedure: LEFT-SIDED LUMBAR TWO -  LUMBAR THREE TRANSFORAMINAL LUMBAR INTERBODY FUSION WITH INSTRUMENTATION AND ALLOGRAFT;  Surgeon: Estill Bamberg, MD;  Location: [facility] OR;  Service: Orthopedics;  Laterality: Right;  LEFT-SIDED LUMBAR TWO - LUMBAR THREE TRANSFORAMINAL LUMBAR INTERBODY FUSION WITH INSTRUMENTATION AND ALLOGRAFT   Family History  Problem Relation Age of Onset   Lupus Mother    Stroke Mother    Diabetes Sister    Lupus Sister    Colon cancer Neg Hx    Colon polyps Neg Hx    Rectal cancer Neg Hx    Stomach cancer Neg Hx    Esophageal cancer Neg Hx    Pancreatic cancer Neg Hx    Liver disease Neg  Hx    Kidney disease Neg Hx    Social History   Socioeconomic History   Marital status: Married    Spouse name: Not on file   Number of children: Not on file   Years of education: Not on file   Highest education level: Not on file  Occupational History   Not on file  Tobacco Use   Smoking status: Never   Smokeless tobacco: Never  Vaping Use   Vaping status: Never Used  Substance and Sexual Activity   Alcohol use: Yes    Alcohol/week: 0.0 standard drinks of alcohol    Comment: 1-2 drinks on weekends     Drug use: No   Sexual activity: Not on file  Other Topics Concern   Not on file  Social History Narrative   ** Merged History Encounter **       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  Finished high school.  Can read and write.  Hasn't seen a doctor in many years.   Social Drivers of Corporate investment banker Strain: Low Risk  (02/28/2023)   Overall Financial Resource Strain (CARDIA)    Difficulty of Paying Living Expenses: Not hard at all  Food Insecurity: No Food Insecurity (02/28/2023)   Hunger Vital Sign    Worried About Running Out of Food in the Last Year: Never true    Ran Out of Food in the Last Year: Never true  Transportation Needs: No Transportation Needs (02/28/2023)   PRAPARE - Administrator, Civil Service (Medical): No    Lack of Transportation (Non-Medical): No  Physical Activity: Sufficiently Active (02/28/2023)   Exercise Vital Sign    Days of Exercise per Week: 3 days    Minutes of Exercise per Session: 60 min  Stress: No Stress Concern Present (02/28/2023)   [employer] of Occupational Health - Occupational Stress Questionnaire    Feeling of Stress : Not at all  Social Connections: Moderately Integrated (02/28/2023)   Social Connection and Isolation Panel [NHANES]    Frequency of Communication with Friends and Family: More than three times a week    Frequency of Social Gatherings with  Friends and Family: More than three times a week    Attends Religious Services: More than 4 times per year    Active Member of [employer] or Organizations: No    Attends Engineer, structural: Never    Marital Status: Married    Tobacco Counseling Counseling given: Not Answered   Clinical Intake:  Pre-visit preparation completed: Yes  Pain : No/denies pain     BMI - recorded: 26.25 Nutritional Status: BMI 25 -29 Overweight Nutritional Risks: None Diabetes: No  How often do you need to have someone help you when you  read instructions, pamphlets, or other written materials from your doctor or pharmacy?: 1 - Never  Interpreter Needed?: No  Information entered by :: Lanier Ensign, LPN   Activities of Daily Living    02/22/2023    2:36 PM  In your present state of health, do you have any difficulty performing the following activities:  Hearing? 0  Vision? 1  Difficulty concentrating or making decisions? 0  Walking or climbing stairs? 1  Comment at times  Dressing or bathing? 0  Doing errands, shopping? 0  Preparing Food and eating ? N  Using the Toilet? N  In the past six months, have you accidently leaked urine? N  Do you have problems with loss of bowel control? N  Managing your Medications? N  Managing your Finances? N  Housekeeping or managing your Housekeeping? N    Patient Care Team: Ardith Dark, MD as PCP - General (Family Medicine) Jodelle Red, MD as PCP - Cardiology (Cardiology) Nelson Chimes, MD as Consulting Physician (Ophthalmology) Sherren Kerns, MD (Inactive) as Consulting Physician (Vascular Surgery)  Indicate any recent Medical Services you may have received from other than [facility] providers in the past year (date may be approximate).     Assessment:   This is a routine wellness examination for [place].  Hearing/Vision screen Hearing Screening - Comments:: Pt denies any hearing issues  Vision Screening - Comments:: Pt  follows up with Dr Randon Goldsmith for annual; eye exams at [facility] ophthalmology for eye exams    Goals Addressed             This Visit's Progress    Patient Stated       Get the stamina back up        Depression Screen    02/28/2023   12:57 PM 06/17/2022    1:52 PM 04/15/2022   10:41 AM 02/18/2022    1:18 PM 04/09/2021    1:29 PM 02/12/2021    1:18 PM 04/03/2020    1:20 PM  PHQ 2/9 Scores  PHQ - 2 Score 0 0 0 0 0 0 0    Fall Risk    02/22/2023    2:36 PM 06/17/2022    1:52 PM 04/15/2022   10:41 AM 02/18/2022    1:20 PM 04/09/2021    1:29 PM  Fall Risk   Falls in the past year? 0 0 0 0 0  Number falls in past yr: 0 0 0 0 0  Injury with Fall? 0 0  0 0  Risk for fall due to : No Fall Risks No Fall Risks No Fall Risks Impaired vision   Follow up Falls prevention discussed   Falls prevention discussed     MEDICARE RISK AT HOME: Medicare Risk at Home Any stairs in or around the home?: (Patient-Rptd) Yes If so, are there any without handrails?: (Patient-Rptd) No Home free of loose throw rugs in walkways, pet beds, electrical cords, etc?: (Patient-Rptd) Yes Adequate lighting in your home to reduce risk of falls?: (Patient-Rptd) Yes Life alert?: (Patient-Rptd) No Use of a cane, walker or w/c?: (Patient-Rptd) No Grab bars in the bathroom?: (Patient-Rptd) No Shower chair or bench in shower?: (Patient-Rptd) Yes Elevated toilet seat or a handicapped toilet?: (Patient-Rptd) No  TIMED UP AND GO:  Was the test performed?  Yes  Length of time to ambulate 10 feet: 10 sec Gait steady and fast without use of assistive device    Cognitive Function:        02/28/2023  12:59 PM 02/18/2022    1:20 PM 02/12/2021    1:22 PM  6CIT Screen  What Year? 0 points 0 points 0 points  What month? 0 points 0 points 0 points  What time? 0 points 0 points 0 points  Count back from 20 0 points 0 points 0 points  Months in reverse 0 points 0 points 0 points  Repeat phrase 0 points 0 points 0 points  Total  Score 0 points 0 points 0 points    Immunizations Immunization History  Administered Date(s) Administered   Fluad Quad(high Dose 65+) 11/30/2021   Influenza Split 10/26/2011   Influenza, High Dose Seasonal PF 01/19/2016, 11/14/2017, 10/21/2018   Influenza-Unspecified 01/19/2016, 11/14/2017, 02/09/2019, 11/18/2020, 02/04/2023   PFIZER(Purple Top)SARS-COV-2 Vaccination 03/02/2019, 03/23/2019, 11/29/2019   Pfizer(Comirnaty)Fall Seasonal Vaccine 12 years and older 11/30/2021   Pneumococcal Conjugate-13 07/04/2014   Pneumococcal Polysaccharide-23 01/19/2016   Td 04/15/2022   Tdap 10/26/2011   Zoster Recombinant(Shingrix) 10/21/2018, 12/20/2018    TDAP status: Up to date  Flu Vaccine status: Up to date  Pneumococcal vaccine status: Up to date  Covid-19 vaccine status: Information provided on how to obtain vaccines.   Qualifies for Shingles Vaccine? Yes   Zostavax completed Yes   Shingrix Completed?: Yes  Screening Tests Health Maintenance  Topic Date Due   COVID-19 Vaccine (5 - 2024-25 season) 10/10/2022   Hepatitis C Screening  04/15/2023 (Originally 06/08/1963)   Medicare Annual Wellness (AWV)  02/28/2024   Colonoscopy  09/03/2024   DTaP/Tdap/Td (3 - Td or Tdap) 04/14/2032   Pneumonia Vaccine 68+ Years old  Completed   INFLUENZA VACCINE  Completed   Zoster Vaccines- Shingrix  Completed   HPV VACCINES  Aged Out    Health Maintenance  Health Maintenance Due  Topic Date Due   COVID-19 Vaccine (5 - 2024-25 season) 10/10/2022    Colorectal cancer screening: Type of screening: Colonoscopy. Completed 09/04/19. Repeat every 5 years   Additional Screening:  Hepatitis C Screening: does qualify;  Vision Screening: Recommended annual ophthalmology exams for early detection of glaucoma and other disorders of the eye. Is the patient up to date with their annual eye exam?  Yes  Who is the provider or what is the name of the office in which the patient attends annual eye exams?  DR Randon Goldsmith at [facility] ophthalmology  If pt is not established with a provider, would they like to be referred to a provider to establish care? No .   Dental Screening: Recommended annual dental exams for proper oral hygiene  Community Resource Referral / Chronic Care Management: CRR required this visit?  No   CCM required this visit?  No     Plan:     I have personally reviewed and noted the following in the patient's chart:   Medical and social history Use of alcohol, tobacco or illicit drugs  Current medications and supplements including opioid prescriptions. Patient is not currently taking opioid prescriptions. Functional ability and status Nutritional status Physical activity Advanced directives List of other physicians Hospitalizations, surgeries, and ER visits in previous 12 months Vitals Screenings to include cognitive, depression, and falls Referrals and appointments  In addition, I have reviewed and discussed with patient certain preventive protocols, quality metrics, and best practice recommendations. A written personalized care plan for preventive services as well as general preventive health recommendations were provided to patient.     Marzella Schlein, LPN   [id number]   After Visit Summary: (MyChart) Due to this being a  telephonic visit, the after visit summary with patients personalized plan was offered to patient via MyChart   Nurse Notes: none

## 2023-02-28 NOTE — Patient Instructions (Signed)
Eduardo Shaw , Thank you for taking time to come for your Medicare Wellness Visit. I appreciate your ongoing commitment to your health goals. Please review the following plan we discussed and let me know if I can assist you in the future.   Referrals/Orders/Follow-Ups/Clinician Recommendations: Aim for 30 minutes of exercise or brisk walking, 6-8 glasses of water, and 5 servings of fruits and vegetables each day. Work on [employer] to increase stamina   This is a list of the screening recommended for you and due dates:  Health Maintenance  Topic Date Due   Flu Shot  09/09/2022   COVID-19 Vaccine (5 - 2024-25 season) 10/10/2022   Medicare Annual Wellness Visit  02/19/2023   Hepatitis C Screening  04/15/2023*   Colon Cancer Screening  09/03/2024   DTaP/Tdap/Td vaccine (3 - Td or Tdap) 04/14/2032   Pneumonia Vaccine  Completed   Zoster (Shingles) Vaccine  Completed   HPV Vaccine  Aged Out  *Topic was postponed. The date shown is not the original due date.    Advanced directives: (Copy Requested) Please bring a copy of your health care power of attorney and living will to the office to be added to your chart at your convenience.  Next Medicare Annual Wellness Visit scheduled for next year: Yes

## 2023-03-18 ENCOUNTER — Ambulatory Visit (INDEPENDENT_AMBULATORY_CARE_PROVIDER_SITE_OTHER): Payer: Medicare Other | Admitting: Family Medicine

## 2023-03-18 ENCOUNTER — Encounter: Payer: Self-pay | Admitting: Family Medicine

## 2023-03-18 VITALS — BP 134/69 | HR 66 | Temp 98.1°F | Ht 73.0 in | Wt 192.6 lb

## 2023-03-18 DIAGNOSIS — J329 Chronic sinusitis, unspecified: Secondary | ICD-10-CM | POA: Diagnosis not present

## 2023-03-18 DIAGNOSIS — R059 Cough, unspecified: Secondary | ICD-10-CM

## 2023-03-18 DIAGNOSIS — I10 Essential (primary) hypertension: Secondary | ICD-10-CM | POA: Diagnosis not present

## 2023-03-18 LAB — POC COVID19 BINAXNOW: SARS Coronavirus 2 Ag: NEGATIVE

## 2023-03-18 MED ORDER — PROMETHAZINE-DM 6.25-15 MG/5ML PO SYRP: 5.0000 mL | ORAL_SOLUTION | Freq: Four times a day (QID) | ORAL | 0 refills | Status: AC | PRN

## 2023-03-18 MED ORDER — AMOXICILLIN-POT CLAVULANATE 875-125 MG PO TABS: 1.0000 | ORAL_TABLET | Freq: Two times a day (BID) | ORAL | 0 refills | Status: AC

## 2023-03-18 NOTE — Patient Instructions (Addendum)
 It was very nice to see you today!  Your COVID test is negative.  I think you have developed a sinus infection.  Start the Augmentin .  Start cough syrup.  Let us  know if not improving.  Return if symptoms worsen or fail to improve.   Take care, Dr Kennyth  PLEASE NOTE:  If you had any lab tests, please let us  know if you have not heard back within a few days. You may see your results on mychart before we have a chance to review them but we will give you a call once they are reviewed by us .   If we ordered any referrals today, please let us  know if you have not heard from their office within the next week.   If you had any urgent prescriptions sent in today, please check with the pharmacy within an hour of our visit to make sure the prescription was transmitted appropriately.   Please try these tips to maintain a healthy lifestyle:  Eat at least 3 REAL meals and 1-2 snacks per day.  Aim for no more than 5 hours between eating.  If you eat breakfast, please do so within one hour of getting up.   Each meal should contain half fruits/vegetables, one quarter protein, and one quarter carbs (no bigger than a computer mouse)  Cut down on sweet beverages. This includes juice, soda, and sweet tea.   Drink at least 1 glass of water with each meal and aim for at least 8 glasses per day  Exercise at least 150 minutes every week.

## 2023-03-18 NOTE — Progress Notes (Signed)
   Eduardo Shaw is a 79 y.o. male who presents today for an office visit.  Assessment/Plan:  New/Acute Problems: Sinusitis His COVID test was negative.  Based on history of symptoms is likely developed a secondary bacterial infection.  We will start Augmentin .  We also discussed Astelin nasal spray however he declined.  Will start promethazine  dextromethorphan cough syrup.  Encouraged hydration.  He can use other over-the-counter meds as needed as well.  We discussed reasons to return to care.  Follow-up as needed.  Chronic Problems Addressed Today: Essential hypertension Blood pressure at goal today on amlodipine  5 mg daily.  He is occasionally getting intermittent ankle swelling.  Likely does have some venous insufficiency however his amlodipine  is likely contributing to this as well.  Symptoms are currently manageable.  We did discuss switching amlodipine  to alternative antihypertensive however we will continue with current regimen for now.     Subjective:  HPI:  See assessment / plan for status of chronic conditions. Patient here with cough and congestion. This started about a week ago. He has had several family members who have been ill with similar symptoms as well. Wife was diagnosed with COVID. Tried taking OTC medications without much improvement. No fevers or chills. He is having a lot of fatigue. No chest pain or shortness of breath.   He does occasionally note some swelling in his ankles.  This comes and goes.  No pain.  Does not currently have any swelling.       Objective:  Physical Exam: BP 134/69   Pulse 66   Temp 98.1 F (36.7 C) (Temporal)   Ht 6' 1 (1.854 m)   Wt 192 lb 9.6 oz (87.4 kg)   SpO2 95%   BMI 25.41 kg/m   Gen: No acute distress, resting comfortably HEENT: TMs with clear effusion.  OP erythematous.  Nasal mucosa erythematous and boggy bilaterally with thick discharge. CV: Regular rate and rhythm with no murmurs appreciated Pulm: Normal work of  breathing, clear to auscultation bilaterally with no crackles, wheezes, or rhonchi MUSCULOSKELETAL: Bilateral lower extremities without any obvious edema. Neuro: Grossly normal, moves all extremities Psych: Normal affect and thought content      Eduardo Cessna M. Kennyth, MD 03/18/2023 12:01 PM

## 2023-03-18 NOTE — Assessment & Plan Note (Signed)
 Blood pressure at goal today on amlodipine  5 mg daily.  He is occasionally getting intermittent ankle swelling.  Likely does have some venous insufficiency however his amlodipine  is likely contributing to this as well.  Symptoms are currently manageable.  We did discuss switching amlodipine  to alternative antihypertensive however we will continue with current regimen for now.

## 2023-03-24 ENCOUNTER — Ambulatory Visit: Payer: Medicare Other | Admitting: Family Medicine

## 2023-03-24 ENCOUNTER — Encounter: Payer: Self-pay | Admitting: Family Medicine

## 2023-03-24 ENCOUNTER — Ambulatory Visit (INDEPENDENT_AMBULATORY_CARE_PROVIDER_SITE_OTHER): Payer: Medicare Other

## 2023-03-24 VITALS — BP 129/64 | HR 60 | Temp 97.2°F | Ht 73.0 in | Wt 192.2 lb

## 2023-03-24 DIAGNOSIS — R051 Acute cough: Secondary | ICD-10-CM

## 2023-03-24 DIAGNOSIS — I700 Atherosclerosis of aorta: Secondary | ICD-10-CM | POA: Diagnosis not present

## 2023-03-24 DIAGNOSIS — R739 Hyperglycemia, unspecified: Secondary | ICD-10-CM

## 2023-03-24 DIAGNOSIS — I10 Essential (primary) hypertension: Secondary | ICD-10-CM

## 2023-03-24 DIAGNOSIS — R059 Cough, unspecified: Secondary | ICD-10-CM | POA: Diagnosis not present

## 2023-03-24 MED ORDER — PREDNISONE 50 MG PO TABS: ORAL_TABLET | ORAL | 0 refills | Status: AC

## 2023-03-24 MED ORDER — LEVOFLOXACIN 500 MG PO TABS: 500.0000 mg | ORAL_TABLET | Freq: Every day | ORAL | 0 refills | Status: AC

## 2023-03-24 NOTE — Assessment & Plan Note (Signed)
Blood pressure at goal on amlodipine 5 mg daily.

## 2023-03-24 NOTE — Patient Instructions (Signed)
It was very nice to see you today!  Please stop the Augmentin and start the Levaquin.  We will start prednisone.  Please make sure that you are getting plenty of fluids and staying hydrated.  We will check an x-ray today.  Return if symptoms worsen or fail to improve.   Take care, Dr Jimmey Ralph  PLEASE NOTE:  If you had any lab tests, please let us know if you have not heard back within a few days. You may see your results on mychart before we have a chance to review them but we will give you a call once they are reviewed by [place].   If we ordered any referrals today, please let us know if you have not heard from their office within the next week.   If you had any urgent prescriptions sent in today, please check with the pharmacy within an hour of our visit to make sure the prescription was transmitted appropriately.   Please try these tips to maintain a healthy lifestyle:  Eat at least 3 REAL meals and 1-2 snacks per day.  Aim for no more than 5 hours between eating.  If you eat breakfast, please do so within one hour of getting up.   Each meal should contain half fruits/vegetables, one quarter protein, and one quarter carbs (no bigger than a computer mouse)  Cut down on sweet beverages. This includes juice, soda, and sweet tea.   Drink at least 1 glass of water with each meal and aim for at least 8 glasses per day  Exercise at least 150 minutes every week.

## 2023-03-24 NOTE — Assessment & Plan Note (Signed)
Last A1c 6.0.  Do not think he will have any issues with prednisone burst as above.

## 2023-03-24 NOTE — Progress Notes (Signed)
   Eduardo Shaw is a 78 y.o. male who presents today for an office visit.  Assessment/Plan:  New/Acute Problems: Cough  Patient has not had any improvement since being treated for sinusitis with a course of Augmentin.  We will switch this to Levaquin.  Also start a round of prednisone.  We did discuss potential side effects of these medications as well.  Does not have any red flag signs or symptoms and overall reassuring exam today however given failure to improve with antibiotics we will check chest x-ray today as well.  Encouraged hydration.  We discussed reasons to return to care.  Chronic Problems Addressed Today: Essential hypertension Blood pressure at goal on amlodipine 5 mg daily.  Hyperglycemia Last A1c 6.0.  Do not think he will have any issues with prednisone burst as above.     Subjective:  HPI:  Patient here for follow-up.  I saw him a week ago for sinusitis.  We started him on Augmentin.  Also started promethazine dextromethorphan cough syrup.  Cough has improved slightly. No fevers or chills. Hard for him to take a deep breath.  Cough is worse when laying on his back. Cough is occasionally       Objective:  Physical Exam: BP 129/64   Pulse 60   Temp (!) 97.2 F (36.2 C) (Temporal)   Ht 6\' 1"  (1.854 m)   Wt 192 lb 3.2 oz (87.2 kg)   SpO2 98%   BMI 25.36 kg/m   Wt Readings from Last 3 Encounters:  03/24/23 192 lb 3.2 oz (87.2 kg)  03/18/23 192 lb 9.6 oz (87.4 kg)  02/28/23 199 lb (90.3 kg)    Gen: No acute distress, resting comfortably HEENT: TMs clear.  OP erythematous.  Nasal mucosa erythematous and boggy bilaterally with clear discharge. CV: Regular rate and rhythm with no murmurs appreciated Pulm: Normal work of breathing, very faint crackles noted at the left base with deep inspiration otherwise clear to auscultation bilaterally. Neuro: Grossly normal, moves all extremities Psych: Normal affect and thought content      Eduardo Shaw M. Jimmey Ralph,  MD 03/24/2023 2:41 PM

## 2023-03-25 ENCOUNTER — Encounter: Payer: Self-pay | Admitting: Family Medicine

## 2023-03-25 NOTE — Progress Notes (Signed)
HIs x-ray does not show any signs of infection.  He should let us know if symptoms are not improving.

## 2023-03-28 ENCOUNTER — Telehealth: Payer: Self-pay

## 2023-03-28 NOTE — Telephone Encounter (Signed)
 Copied from CRM [id number]. Topic: Clinical - Lab/Test Results >> Mar 25, 2023  2:40 PM Alcus Dad H wrote: Reason for CRM: Patient was calling to see when Dr. Jimmey Ralph would be able to get around to reading his results from the chest xray he had on yesterday.  Dr Jimmey Ralph has read results and placed comments on results; Pt viewed on 03/25/23 and nothing further needed at this time.

## 2023-03-28 NOTE — Telephone Encounter (Signed)
 Copied from CRM [id number]. Topic: Clinical - Lab/Test Results >> Mar 25, 2023  2:40 PM Alcus Dad H wrote: Reason for CRM: Patient was calling to see when Dr. Jimmey Ralph would be able to get around to reading his results from the chest xray he had on yesterday.  Spoke with pt regarding x-ray results and he stated that he has improved.

## 2023-04-19 DIAGNOSIS — L570 Actinic keratosis: Secondary | ICD-10-CM | POA: Diagnosis not present

## 2023-04-19 DIAGNOSIS — Z85828 Personal history of other malignant neoplasm of skin: Secondary | ICD-10-CM | POA: Diagnosis not present

## 2023-04-19 DIAGNOSIS — L821 Other seborrheic keratosis: Secondary | ICD-10-CM | POA: Diagnosis not present

## 2023-04-25 ENCOUNTER — Other Ambulatory Visit: Payer: Self-pay | Admitting: Family Medicine

## 2023-04-25 DIAGNOSIS — K2100 Gastro-esophageal reflux disease with esophagitis, without bleeding: Secondary | ICD-10-CM

## 2023-04-25 DIAGNOSIS — R1314 Dysphagia, pharyngoesophageal phase: Secondary | ICD-10-CM

## 2023-04-25 DIAGNOSIS — K222 Esophageal obstruction: Secondary | ICD-10-CM

## 2023-04-25 DIAGNOSIS — E7800 Pure hypercholesterolemia, unspecified: Secondary | ICD-10-CM

## 2023-04-25 DIAGNOSIS — K253 Acute gastric ulcer without hemorrhage or perforation: Secondary | ICD-10-CM

## 2023-04-25 DIAGNOSIS — I6523 Occlusion and stenosis of bilateral carotid arteries: Secondary | ICD-10-CM

## 2023-04-25 MED ORDER — AMLODIPINE BESYLATE 5 MG PO TABS: 5.0000 mg | ORAL_TABLET | Freq: Every day | ORAL | 3 refills | Status: AC

## 2023-04-25 MED ORDER — OMEPRAZOLE 40 MG PO CPDR: 40.0000 mg | DELAYED_RELEASE_CAPSULE | Freq: Every day | ORAL | 3 refills | Status: AC

## 2023-04-25 NOTE — Telephone Encounter (Signed)
 Copied from CRM [id number]. Topic: Clinical - Medication Refill >> Apr 25, 2023  9:14 AM Benetta Spar A wrote: Most Recent Primary Care Visit:  Provider: Ardith Dark  Department: [facility]  Visit Type: ACUTE  Date: 03/24/2023  Medication: amLODipine (NORVASC) 5 MG tablet; omeprazole (PRILOSEC) 40 MG capsule; rosuvastatin (CRESTOR) 20 MG tablet  Has the patient contacted their pharmacy? No (Agent: If no, request that the patient contact the pharmacy for the refill. If patient does not wish to contact the pharmacy document the reason why and proceed with request.) (Agent: If yes, when and what did the pharmacy advise?)  Is this the correct pharmacy for this prescription? Yes If no, delete pharmacy and type the correct one.  This is the patient's preferred pharmacy:  [id number] [place], [place] - [address] AT [facility] [address] [facility] [address] & [facility] [address] [place] [address] Phone: [phone] Fax: [phone]   Has the prescription been filled recently? No  Is the patient out of the medication? Yes  Has the patient been seen for an appointment in the last year OR does the patient have an upcoming appointment? Yes  Can we respond through MyChart? Yes  Agent: Please be advised that Rx refills may take up to 3 business days. We ask that you follow-up with your pharmacy.

## 2023-05-10 ENCOUNTER — Ambulatory Visit: Payer: Self-pay

## 2023-05-10 NOTE — Telephone Encounter (Signed)
  Chief Complaint: goot swelling Symptoms: swelling of right foot Frequency: x1 week Pertinent Negatives: Patient denies pain Disposition: [] ED /[] Urgent Care (no appt availability in office) / [] Appointment(In office/virtual)/ []  [facility]/ [x] Home Care/ [x] Refused Recommended Disposition /[] [facility]/ [x]  Follow-up with PCP Additional Notes: Patient states that he has noticed more swelling in his right foot that worsened over the weekend. States that he would like to know if Dr. Jimmey Ralph would like for him to see someone else regarding this swelling like podiatry. Offered appt pt decline and wants to wait to hear what PCP say.   Copied from CRM [id number]. Topic: Clinical - Red Word Triage >> May 10, 2023  2:28 PM [place] E wrote: [facility] that prompted transfer to Nurse Triage: Foot swelling. Patient stated his left foot goes through waves of swelling, and patient stated over the weekend it was getting worse. Patient denied having pain, but he feels a tightness in his foot. Reason for Disposition  [1] MILD swelling of both ankles (i.e., pedal edema) AND [2] is a chronic symptom (recurrent or ongoing AND present > 4 weeks)  Answer Assessment - Initial Assessment Questions 1. ONSET: "When did the swelling start?" (e.g., minutes, hours, days)     Gotten worse over weekend 2. LOCATION: "What part of the leg is swollen?"  "Are both legs swollen or just one leg?"     Left foot 3. SEVERITY: "How bad is the swelling?" (e.g., localized; mild, moderate, severe)   - Localized: Small area of swelling localized to one leg.   - MILD pedal edema: Swelling limited to foot and ankle, pitting edema < 1/4 inch (6 mm) deep, rest and elevation eliminate most or all swelling.   - MODERATE edema: Swelling of lower leg to knee, pitting edema > 1/4 inch (6 mm) deep, rest and elevation only partially reduce swelling.   - SEVERE edema: Swelling extends above knee, facial or hand swelling  present.      mild 4. REDNESS: "Does the swelling look red or infected?"     No redness 5. PAIN: "Is the swelling painful to touch?" If Yes, ask: "How painful is it?"   (Scale 1-10; mild, moderate or severe)     No pain 6. FEVER: "Do you have a fever?" If Yes, ask: "What is it, how was it measured, and when did it start?"      No fever 7. CAUSE: "What do you think is causing the leg swelling?"     unknown 8. MEDICAL HISTORY: "Do you have a history of blood clots (e.g., DVT), cancer, heart failure, kidney disease, or liver failure?"     Just HTN 9. RECURRENT SYMPTOM: "Have you had leg swelling before?" If Yes, ask: "When was the last time?" "What happened that time?"     months 10. OTHER SYMPTOMS: "Do you have any other symptoms?" (e.g., chest pain, difficulty breathing)       no  Protocols used: Leg Swelling and Edema-A-AH

## 2023-05-11 NOTE — Telephone Encounter (Signed)
 Noted.

## 2023-06-14 ENCOUNTER — Other Ambulatory Visit: Payer: Self-pay | Admitting: Family Medicine

## 2023-06-14 DIAGNOSIS — E7800 Pure hypercholesterolemia, unspecified: Secondary | ICD-10-CM

## 2023-06-14 DIAGNOSIS — I6523 Occlusion and stenosis of bilateral carotid arteries: Secondary | ICD-10-CM

## 2023-06-14 MED ORDER — ROSUVASTATIN CALCIUM 20 MG PO TABS: 20.0000 mg | ORAL_TABLET | Freq: Every day | ORAL | 3 refills | Status: AC

## 2023-06-14 NOTE — Telephone Encounter (Signed)
 Copied from CRM [id number]. Topic: Clinical - Medication Refill >> Jun 14, 2023 11:16 AM Bobbie Burows wrote: Most Recent Primary Care Visit:  Provider: Rodney Clamp  Department: [facility]  Visit Type: ACUTE  Date: 03/24/2023  Medication: rosuvastatin  (CRESTOR ) 20 MG tablet [[id number]]   Has the patient contacted their pharmacy? Yes (Agent: If no, request that the patient contact the pharmacy for the refill. If patient does not wish to contact the pharmacy document the reason why and proceed with request.) (Agent: If yes, when and what did the pharmacy advise?)  Is this the correct pharmacy for this prescription? Yes If no, delete pharmacy and type the correct one.  This is the patient's preferred pharmacy:  [facility] #[id number] [place], [place] - [address] AT [facility] OF [facility] [address] & [facility] [address] [place] [address] Phone: [phone] Fax: [phone]   Has the prescription been filled recently? No  Is the patient out of the medication? No  Has the patient been seen for an appointment in the last year OR does the patient have an upcoming appointment? Yes  Can we respond through MyChart? No  Agent: Please be advised that Rx refills may take up to 3 business days. We ask that you follow-up with your pharmacy.

## 2023-06-30 ENCOUNTER — Telehealth: Payer: Self-pay

## 2023-06-30 NOTE — Telephone Encounter (Signed)
 Pt called with c/o LUE numbness. This had been noted in his May 2024 office visit as well, though pt did not seem to recall that. He was advised at that time to see PCP regarding this. Pt can't remember if he discussed this with PCP but has been advised to call and make an appt. He verbalized understanding. Advised him to report to the ED and call us  if he ever experiences any stroke symptoms, which we reviewed. He will call us  if he needs any further assistance.

## 2023-08-24 DIAGNOSIS — H353132 Nonexudative age-related macular degeneration, bilateral, intermediate dry stage: Secondary | ICD-10-CM | POA: Diagnosis not present

## 2023-08-24 DIAGNOSIS — H40023 Open angle with borderline findings, high risk, bilateral: Secondary | ICD-10-CM | POA: Diagnosis not present

## 2023-08-24 DIAGNOSIS — H2513 Age-related nuclear cataract, bilateral: Secondary | ICD-10-CM | POA: Diagnosis not present

## 2023-08-24 DIAGNOSIS — H25013 Cortical age-related cataract, bilateral: Secondary | ICD-10-CM | POA: Diagnosis not present

## 2023-09-19 DIAGNOSIS — H2511 Age-related nuclear cataract, right eye: Secondary | ICD-10-CM | POA: Diagnosis not present

## 2023-09-19 DIAGNOSIS — H25011 Cortical age-related cataract, right eye: Secondary | ICD-10-CM | POA: Diagnosis not present

## 2023-09-19 DIAGNOSIS — H25811 Combined forms of age-related cataract, right eye: Secondary | ICD-10-CM | POA: Diagnosis not present

## 2023-10-03 DIAGNOSIS — H25012 Cortical age-related cataract, left eye: Secondary | ICD-10-CM | POA: Diagnosis not present

## 2023-10-03 DIAGNOSIS — H2512 Age-related nuclear cataract, left eye: Secondary | ICD-10-CM | POA: Diagnosis not present

## 2023-10-03 DIAGNOSIS — H25812 Combined forms of age-related cataract, left eye: Secondary | ICD-10-CM | POA: Diagnosis not present

## 2023-11-08 DIAGNOSIS — H353132 Nonexudative age-related macular degeneration, bilateral, intermediate dry stage: Secondary | ICD-10-CM | POA: Diagnosis not present

## 2023-11-17 DIAGNOSIS — Z961 Presence of intraocular lens: Secondary | ICD-10-CM | POA: Diagnosis not present

## 2023-11-17 DIAGNOSIS — H353131 Nonexudative age-related macular degeneration, bilateral, early dry stage: Secondary | ICD-10-CM | POA: Diagnosis not present

## 2023-11-17 DIAGNOSIS — H43813 Vitreous degeneration, bilateral: Secondary | ICD-10-CM | POA: Diagnosis not present

## 2023-11-17 DIAGNOSIS — H35033 Hypertensive retinopathy, bilateral: Secondary | ICD-10-CM | POA: Diagnosis not present

## 2023-11-17 LAB — OPHTHALMOLOGY REPORT-SCANNED

## 2024-03-01 ENCOUNTER — Ambulatory Visit: Payer: Medicare Other

## 2024-03-06 ENCOUNTER — Other Ambulatory Visit: Payer: Self-pay

## 2024-03-06 DIAGNOSIS — I6523 Occlusion and stenosis of bilateral carotid arteries: Secondary | ICD-10-CM

## 2024-03-30 ENCOUNTER — Ambulatory Visit (HOSPITAL_COMMUNITY)

## 2024-03-30 ENCOUNTER — Ambulatory Visit
